# Patient Record
Sex: Female | Born: 1955 | Race: White | Hispanic: No | Marital: Married | Smoking: Former smoker
Health system: Southern US, Community
[De-identification: ages and names within clinical notes are randomized; demographics above are authoritative.]

## PROBLEM LIST (undated history)

## (undated) DIAGNOSIS — C439 Malignant melanoma of skin, unspecified: Secondary | ICD-10-CM

## (undated) DIAGNOSIS — N281 Cyst of kidney, acquired: Secondary | ICD-10-CM

## (undated) DIAGNOSIS — M869 Osteomyelitis, unspecified: Secondary | ICD-10-CM

## (undated) DIAGNOSIS — Z87440 Personal history of urinary (tract) infections: Secondary | ICD-10-CM

## (undated) DIAGNOSIS — M4800 Spinal stenosis, site unspecified: Secondary | ICD-10-CM

## (undated) DIAGNOSIS — E041 Nontoxic single thyroid nodule: Secondary | ICD-10-CM

## (undated) DIAGNOSIS — M8580 Other specified disorders of bone density and structure, unspecified site: Secondary | ICD-10-CM

## (undated) DIAGNOSIS — I10 Essential (primary) hypertension: Secondary | ICD-10-CM

## (undated) DIAGNOSIS — M1990 Unspecified osteoarthritis, unspecified site: Secondary | ICD-10-CM

## (undated) HISTORY — DX: Spinal stenosis, site unspecified: M48.00

## (undated) HISTORY — DX: Essential (primary) hypertension: I10

## (undated) HISTORY — DX: Malignant melanoma of skin, unspecified: C43.9

## (undated) HISTORY — DX: Other specified disorders of bone density and structure, unspecified site: M85.80

## (undated) HISTORY — DX: Cyst of kidney, acquired: N28.1

## (undated) HISTORY — DX: Osteomyelitis, unspecified: M86.9

## (undated) HISTORY — DX: Unspecified osteoarthritis, unspecified site: M19.90

## (undated) HISTORY — PX: SKIN CANCER EXCISION: SHX779

## (undated) HISTORY — DX: Nontoxic single thyroid nodule: E04.1

---
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
## 1999-11-25 DIAGNOSIS — M179 Osteoarthritis of knee, unspecified: Secondary | ICD-10-CM | POA: Insufficient documentation

## 2003-08-19 DIAGNOSIS — E039 Hypothyroidism, unspecified: Secondary | ICD-10-CM | POA: Insufficient documentation

## 2006-03-15 ENCOUNTER — Ambulatory Visit: Payer: Self-pay | Admitting: Internal Medicine

## 2006-03-18 ENCOUNTER — Ambulatory Visit: Payer: Self-pay | Admitting: Internal Medicine

## 2007-02-24 ENCOUNTER — Encounter: Admission: RE | Admit: 2007-02-24 | Discharge: 2007-02-24 | Payer: Self-pay | Admitting: Internal Medicine

## 2007-02-27 ENCOUNTER — Ambulatory Visit: Payer: Self-pay | Admitting: Oncology

## 2007-03-04 LAB — COMPREHENSIVE METABOLIC PANEL
ALT: 16 U/L (ref 0–35)
CO2: 30 mEq/L (ref 19–32)
Calcium: 9.7 mg/dL (ref 8.4–10.5)
Chloride: 107 mEq/L (ref 96–112)
Creatinine, Ser: 0.68 mg/dL (ref 0.40–1.20)
Glucose, Bld: 102 mg/dL — ABNORMAL HIGH (ref 70–99)
Sodium: 140 mEq/L (ref 135–145)
Total Bilirubin: 0.8 mg/dL (ref 0.3–1.2)
Total Protein: 6.5 g/dL (ref 6.0–8.3)

## 2007-03-04 LAB — CBC WITH DIFFERENTIAL (CANCER CENTER ONLY)
BASO#: 0 10*3/uL (ref 0.0–0.2)
BASO%: 0.6 % (ref 0.0–2.0)
EOS%: 1.7 % (ref 0.0–7.0)
HGB: 13.1 g/dL (ref 11.6–15.9)
MCH: 32.2 pg (ref 26.0–34.0)
MCHC: 34.7 g/dL (ref 32.0–36.0)
MONO%: 5.4 % (ref 0.0–13.0)
NEUT#: 3.3 10*3/uL (ref 1.5–6.5)
RDW: 11.5 % (ref 10.5–14.6)

## 2007-03-04 LAB — LACTATE DEHYDROGENASE: LDH: 129 U/L (ref 94–250)

## 2007-03-11 ENCOUNTER — Ambulatory Visit (HOSPITAL_COMMUNITY): Admission: RE | Admit: 2007-03-11 | Discharge: 2007-03-11 | Payer: Self-pay | Admitting: Oncology

## 2007-03-25 ENCOUNTER — Encounter: Admission: RE | Admit: 2007-03-25 | Discharge: 2007-03-25 | Payer: Self-pay | Admitting: Internal Medicine

## 2007-08-19 DIAGNOSIS — I10 Essential (primary) hypertension: Secondary | ICD-10-CM | POA: Insufficient documentation

## 2007-08-19 DIAGNOSIS — C439 Malignant melanoma of skin, unspecified: Secondary | ICD-10-CM | POA: Insufficient documentation

## 2007-09-03 ENCOUNTER — Ambulatory Visit: Payer: Self-pay | Admitting: Oncology

## 2007-09-04 LAB — CBC WITH DIFFERENTIAL (CANCER CENTER ONLY)
EOS%: 3.9 % (ref 0.0–7.0)
Eosinophils Absolute: 0.2 10*3/uL (ref 0.0–0.5)
MCH: 31.8 pg (ref 26.0–34.0)
MCHC: 34.4 g/dL (ref 32.0–36.0)
MONO%: 6.5 % (ref 0.0–13.0)
NEUT#: 2.1 10*3/uL (ref 1.5–6.5)
Platelets: 228 10*3/uL (ref 145–400)
RBC: 4.11 10*6/uL (ref 3.70–5.32)

## 2007-09-04 LAB — COMPREHENSIVE METABOLIC PANEL
ALT: 17 U/L (ref 0–35)
AST: 18 U/L (ref 0–37)
Alkaline Phosphatase: 62 U/L (ref 39–117)
Creatinine, Ser: 0.78 mg/dL (ref 0.40–1.20)
Sodium: 140 mEq/L (ref 135–145)
Total Bilirubin: 0.7 mg/dL (ref 0.3–1.2)
Total Protein: 6.7 g/dL (ref 6.0–8.3)

## 2007-12-06 IMAGING — PT NM PET TUM IMG WHOLE BODY
6 series · 25 of 25 positions shown · non-contrast
Comparison: None available.

CLINICAL DATA: Newly diagnosed melanoma resected from right arm.  Staging.  
 FDG PET-CT TUMOR IMAGING (WHOLE BODY):
 Fasting Blood Glucose:  118.
TECHNIQUE: 15.6 mCi F-18 FDG was injected intravenously via the left arm.  Full-ring PET imaging was performed from the skull vertex through the lower extremities 45 minutes after injection.  CT data was obtained and used for attenuation correction and anatomic localization only.  (This was not acquired as a diagnostic CT examination.)

[Series 1: pet ac · axial · 3.3mm · 4.69mm/px · z∈[-870,+0]mm · 5 of 267 slices shown]
[im 1/267]
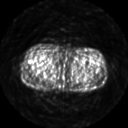
[im 67/267]
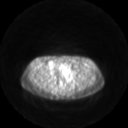
[im 134/267]
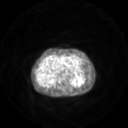
[im 200/267]
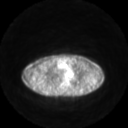
[im 267/267]
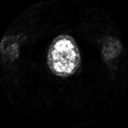

[Series 2: pet nac · axial · 3.3mm · 4.69mm/px · z∈[-870,+0]mm · 6 of 267 slices shown]
[im 1/267]
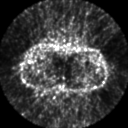
[im 54/267]
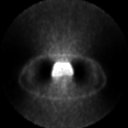
[im 107/267]
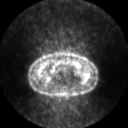
[im 160/267]
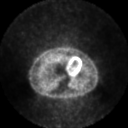
[im 213/267]
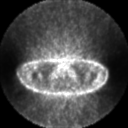
[im 267/267]
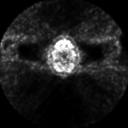

[Series 2: ct images · axial · 3.8mm · 0.98mm/px · z∈[-870,+0]mm · 6 of 267 slices shown]
[im 1/267  soft-tissue]
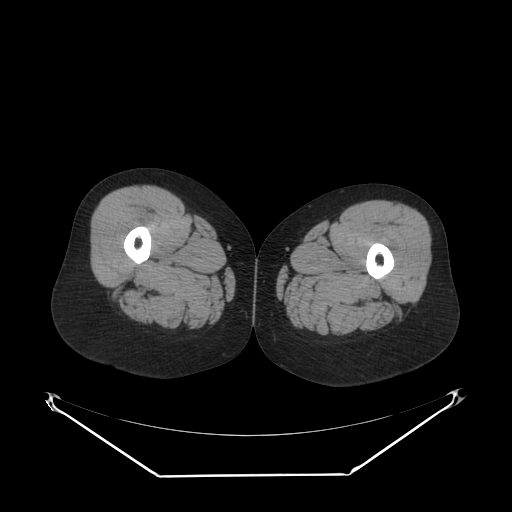
[im 54/267  soft-tissue]
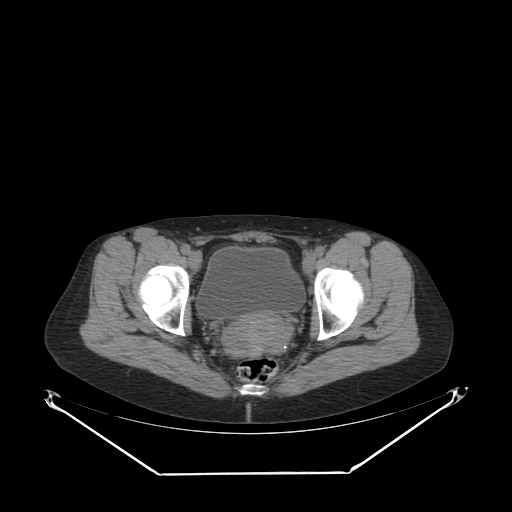
[im 107/267  soft-tissue]
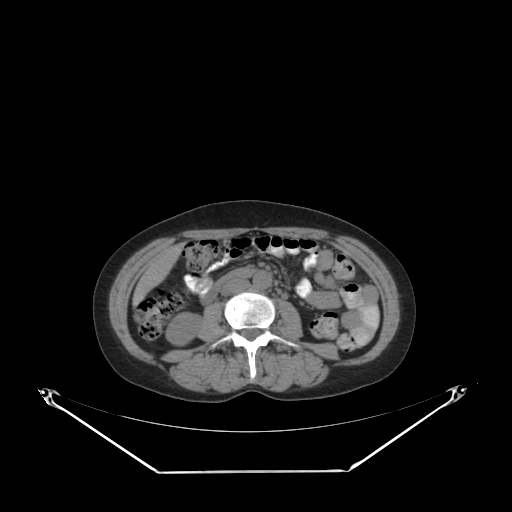
[im 160/267  soft-tissue]
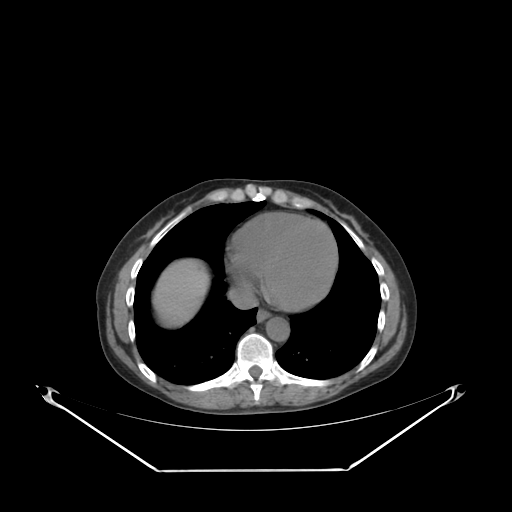
[im 213/267  soft-tissue]
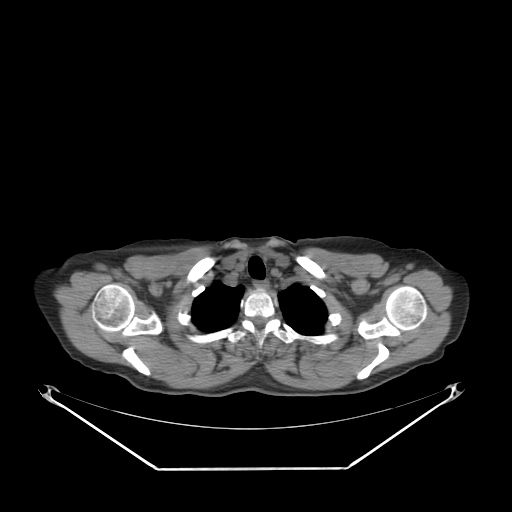
[im 267/267  soft-tissue]
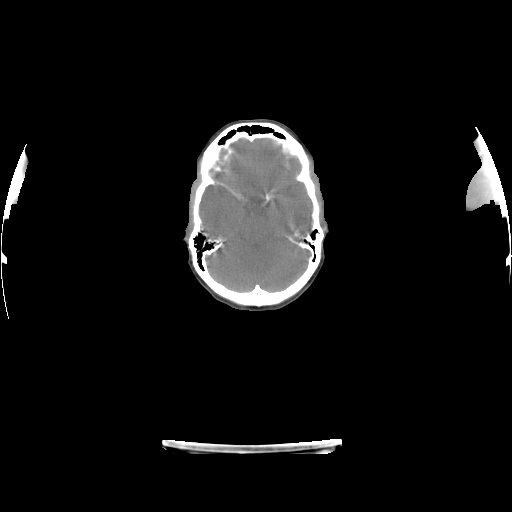

[Series 123: mip · coronal · 3.3mm · 4.69mm/px · 1 of 30 slices shown]
[im 1/30]
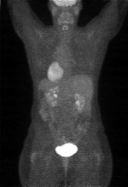

[Series 151: reformatted · axial · 3.3mm · 3.91mm/px · z∈[-870,+0]mm · 6 of 265 slices shown (1 of 2)]
[im 1/265]
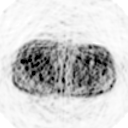
[im 53/265]
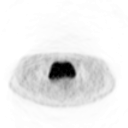
[im 106/265]
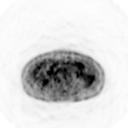
[im 159/265]
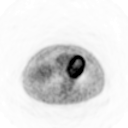
[im 212/265]
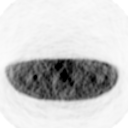
[im 265/265]
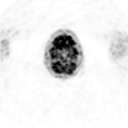

[Series 153: reformatted · coronal · 4.7mm · 6.98mm/px · 1 of 68 slices shown (2 of 2)]
[im 1/68]
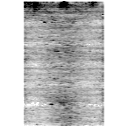

[25 of 25 positions shown; findings below may reference images not displayed]

FINDINGS: No abnormal sites of FDG uptake are identified within the neck, chest, abdomen or pelvis.  Additionally, there are no abnormal foci of radiopharmaceutical uptake seen in the lower extremities.
IMPRESSION: Negative PET CT scan.  No evidence of malignancy.

## 2008-08-16 ENCOUNTER — Encounter: Admission: RE | Admit: 2008-08-16 | Discharge: 2008-08-16 | Payer: Self-pay | Admitting: Internal Medicine

## 2008-08-31 ENCOUNTER — Ambulatory Visit: Payer: Self-pay | Admitting: Oncology

## 2008-09-16 LAB — CBC WITH DIFFERENTIAL (CANCER CENTER ONLY)
BASO#: 0 10*3/uL (ref 0.0–0.2)
EOS%: 2.7 % (ref 0.0–7.0)
HCT: 40.7 % (ref 34.8–46.6)
HGB: 14 g/dL (ref 11.6–15.9)
LYMPH#: 1.5 10*3/uL (ref 0.9–3.3)
LYMPH%: 24 % (ref 14.0–48.0)
MCH: 31.8 pg (ref 26.0–34.0)
MCHC: 34.4 g/dL (ref 32.0–36.0)
MCV: 92 fL (ref 81–101)
MONO%: 4.3 % (ref 0.0–13.0)
NEUT%: 68.4 % (ref 39.6–80.0)
RDW: 11 % (ref 10.5–14.6)

## 2008-09-16 LAB — COMPREHENSIVE METABOLIC PANEL
ALT: 14 U/L (ref 0–35)
Alkaline Phosphatase: 76 U/L (ref 39–117)
CO2: 26 mEq/L (ref 19–32)
Creatinine, Ser: 0.65 mg/dL (ref 0.40–1.20)
Sodium: 136 mEq/L (ref 135–145)
Total Bilirubin: 0.9 mg/dL (ref 0.3–1.2)
Total Protein: 7.4 g/dL (ref 6.0–8.3)

## 2008-09-16 LAB — LACTATE DEHYDROGENASE: LDH: 158 U/L (ref 94–250)

## 2009-02-24 ENCOUNTER — Ambulatory Visit: Payer: Self-pay | Admitting: Internal Medicine

## 2009-06-03 ENCOUNTER — Ambulatory Visit: Payer: Self-pay | Admitting: Internal Medicine

## 2009-07-19 ENCOUNTER — Ambulatory Visit: Payer: Self-pay | Admitting: Internal Medicine

## 2009-07-29 ENCOUNTER — Ambulatory Visit: Payer: Self-pay | Admitting: Internal Medicine

## 2010-01-26 ENCOUNTER — Ambulatory Visit: Payer: Self-pay | Admitting: Internal Medicine

## 2010-07-27 ENCOUNTER — Ambulatory Visit: Payer: Self-pay | Admitting: Internal Medicine

## 2010-08-02 ENCOUNTER — Encounter: Admission: RE | Admit: 2010-08-02 | Discharge: 2010-08-02 | Payer: Self-pay | Admitting: Internal Medicine

## 2010-08-18 DIAGNOSIS — Z780 Asymptomatic menopausal state: Secondary | ICD-10-CM | POA: Insufficient documentation

## 2010-11-03 ENCOUNTER — Ambulatory Visit (HOSPITAL_BASED_OUTPATIENT_CLINIC_OR_DEPARTMENT_OTHER): Admission: RE | Admit: 2010-11-03 | Discharge: 2010-11-03 | Payer: Self-pay | Admitting: Orthopedic Surgery

## 2011-02-16 ENCOUNTER — Inpatient Hospital Stay (HOSPITAL_COMMUNITY)
Admission: AD | Admit: 2011-02-16 | Discharge: 2011-02-22 | DRG: 373 | Disposition: A | Discharge status: Home / Self Care | Payer: 59 | Source: Ambulatory Visit | Attending: Internal Medicine | Admitting: Internal Medicine

## 2011-02-16 ENCOUNTER — Ambulatory Visit
Admission: RE | Admit: 2011-02-16 | Discharge: 2011-02-16 | Disposition: A | Discharge status: Home / Self Care | Payer: 59 | Source: Ambulatory Visit | Attending: Internal Medicine | Admitting: Internal Medicine

## 2011-02-16 ENCOUNTER — Ambulatory Visit: Payer: 59 | Admitting: Internal Medicine

## 2011-02-16 ENCOUNTER — Other Ambulatory Visit: Payer: Self-pay | Admitting: Internal Medicine

## 2011-02-16 DIAGNOSIS — E039 Hypothyroidism, unspecified: Secondary | ICD-10-CM | POA: Diagnosis present

## 2011-02-16 DIAGNOSIS — K3533 Acute appendicitis with perforation and localized peritonitis, with abscess: Principal | ICD-10-CM | POA: Diagnosis present

## 2011-02-16 DIAGNOSIS — K3580 Unspecified acute appendicitis: Secondary | ICD-10-CM

## 2011-02-16 DIAGNOSIS — I10 Essential (primary) hypertension: Secondary | ICD-10-CM | POA: Diagnosis present

## 2011-02-16 DIAGNOSIS — N289 Disorder of kidney and ureter, unspecified: Secondary | ICD-10-CM | POA: Diagnosis present

## 2011-02-16 DIAGNOSIS — Z85820 Personal history of malignant melanoma of skin: Secondary | ICD-10-CM

## 2011-02-16 LAB — PROTIME-INR: INR: 1 (ref 0.00–1.49)

## 2011-02-16 LAB — APTT: aPTT: 39 seconds — ABNORMAL HIGH (ref 24–37)

## 2011-02-16 MED ORDER — IOHEXOL 300 MG/ML  SOLN: 100.0000 mL | Freq: Once | INTRAMUSCULAR | Status: DC | PRN

## 2011-02-16 MED ORDER — IOHEXOL 300 MG/ML  SOLN
100.0000 mL | Freq: Once | INTRAMUSCULAR | Status: AC | PRN
  Administered 2011-02-16: 100 mL via INTRAVENOUS

## 2011-02-17 ENCOUNTER — Inpatient Hospital Stay (HOSPITAL_COMMUNITY): Payer: 59

## 2011-02-17 LAB — DIFFERENTIAL
Basophils Absolute: 0 10*3/uL (ref 0.0–0.1)
Basophils Relative: 1 % (ref 0–1)
Eosinophils Absolute: 0.1 10*3/uL (ref 0.0–0.7)
Monocytes Absolute: 0.4 10*3/uL (ref 0.1–1.0)
Neutrophils Relative %: 71 % (ref 43–77)

## 2011-02-17 LAB — CBC
Hemoglobin: 12.2 g/dL (ref 12.0–15.0)
MCV: 95.6 fL (ref 78.0–100.0)
RBC: 3.87 MIL/uL (ref 3.87–5.11)
WBC: 4.7 10*3/uL (ref 4.0–10.5)

## 2011-02-17 LAB — URINALYSIS, ROUTINE W REFLEX MICROSCOPIC
Bilirubin Urine: NEGATIVE
Ketones, ur: NEGATIVE mg/dL
Leukocytes, UA: NEGATIVE
Protein, ur: NEGATIVE mg/dL
Urine Glucose, Fasting: NEGATIVE mg/dL
Urobilinogen, UA: 1 mg/dL (ref 0.0–1.0)
pH: 6.5 (ref 5.0–8.0)

## 2011-02-17 LAB — URINE MICROSCOPIC-ADD ON

## 2011-02-17 LAB — BASIC METABOLIC PANEL
Calcium: 9.4 mg/dL (ref 8.4–10.5)
Creatinine, Ser: 0.61 mg/dL (ref 0.4–1.2)
GFR calc Af Amer: >60 mL/min (ref >60)
Potassium: 4.3 mEq/L (ref 3.5–5.1)
Sodium: 140 mEq/L (ref 135–145)

## 2011-02-18 NOTE — Consult Note (Signed)
Tara Stanley, ROLLO NO.:  [id number]  MEDICAL RECORD NO.:  [id number]           PATIENT TYPE:  I  LOCATION:  1529                         FACILITY:  [facility]  PHYSICIAN:  [name], M.D.DATE OF BIRTH:  04-19-1956  DATE OF CONSULTATION:  02/16/2011 DATE OF DISCHARGE:                                CONSULTATION   CHIEF COMPLAINT:  Ruptured appendicitis.  HISTORY OF PRESENT ILLNESS:  This is a 55 year old white female, previously well.  She noted the onset of diffuse crampy abdominal pain in the evening on Monday, February 12, 2011.  She has had some low-grade fever and some chills all week, but during the week she has been able to eat and has had normal bowel movements.  She says the pain is mostly in the lower abdomen now.  Today, she had 1 episode of vomiting and said she had 1 stool that was a little bit looser.  She states that last night  she had fever to 102 and was sweaty and had some chills.  She denies sore throat or headache or new-onset back pain.  She has not had any voiding symptoms or urologic problems.  She saw Dr. Eden Emms Baxley today.  Dr. Lenord Fellers obtained a CBC, which reportedly showed a white count of 9900 with a left shift.  A CT scan of the abdomen was performed today, which shows an inflamed appendix with appendicolith in it.  There are 2 tiny fluid collections in the region of the right lower quadrant, much too small to be drainable and it is also noted incidentally a 1.6 cm solid left renal mass that is somewhat concerning for possible renal cell carcinoma.  The patient has never had a colonoscopy and was admitted to the hospital by Dr. Sharlet Salina. We were asked to see the patient in consultation.  PAST MEDICAL HISTORY: 1. Hypertension. 2. Hypothyroidism. 3. Herniated L5 disk, not requiring surgery. 4. Right knee arthroscopy, November 2011. 5. Melanoma right arm resected, no known recurrence. 6. Osteomyelitis, left  ankle, age 37.  MEDICATIONS: 1. Synthroid 50 mcg per day. 2. Cozaar 100 mg a day. 3. Calcium carbonate.  DRUG ALLERGIES:  None known.  FAMILY HISTORY:  Mother living, has diabetes, obesity, hypertension. Father deceased, had a stroke and had COPD.  Two siblings, 1 brother and 1 sister, both have multiple orthopedic problems and are obese.  SOCIAL HISTORY:  The patient is married.  She has 2 children with her husband.  She is unemployed.  Her husband works for [employer].  She does not smoke.  She quit smoking at age 22.  She and her husband drink wine daily.  REVIEW OF SYSTEMS:  10-system review of systems is negative except as described above.  PHYSICAL EXAMINATION:  GENERAL:  A very healthy-appearing, thin middle- aged woman, in no acute distress.  She clearly is nontoxic, very conversant, does admit to having some lower abdominal pain. VITAL SIGNS:  Temperature 98.4, heart rate 79, respiratory rate 18, blood pressure 132/86. EYES:  Sclerae clear.  Extraocular movements intact. EARS, NOSE, MOUTH, and THROAT:  Nose, lips, tongue, and  oropharynx are without gross lesions. NECK:  Supple, nontender.  No mass.  No jugular venous distention. LUNGS:  Clear to auscultation.  No chest wall tenderness. HEART:  Regular rate and rhythm.  No tachycardia.  No ectopy.  Radial and femoral pulses are palpable. BREASTS:  Not examined. ABDOMEN:  Soft and nondistended.  Bowel sounds are present.  Liver and spleen are not enlarged.  She is tender in the right lower quadrant and to a lesser extent the left lower quadrant, but there is no mass.  There is no sign of any diffuse peritonitis.  There is some guarding in the right lower quadrant. GENITOURINARY:  There is no inguinal hernia or mass that I can detect. EXTREMITIES:  She moves all 4  extremities well without pain or deformity. NEUROLOGIC:  No gross motor or sensory deficits.  DATA ORDER REVIEWED:  I reviewed Dr. Eden Emms Baxley's  notes.  I have reviewed the CT scan personally.  ASSESSMENT: 1. Ruptured appendicitis with walled off microabscesses.  There is no     evidence of any peritonitis, obstruction, and there is no evidence     that any of these abscesses are large enough to drain. 2. Left renal mass. 3. Hypertension. 4. Hypothyroidism. 5. Remote history of melanoma in the right arm.  PLAN: 1. Since we are now 5 days into the course of for appendicitis and it apperars to be      walled off, I recommend that the initial treatment be     medical management with a broad-spectrum intravenous antibiotics,     analgesics, and bowel rest. 2. If she responds to this course of action, then will do a followup     CT scan in the few days and hopefully she can go home on     antibiotics and consider interval appendectomy in about 8 weeks. 3. If she fails to respond in 36-48 hours, we may need to consider     surgical intervention during this admission, although that will be     a higher risk for injury to adjacent structures and higher risk for     conversion to open laparotomy. 4. She will need a urology consult at some point regarding her left     renal mass.  I have discussed this with the patient and her husband and they are comfortable with this plan.  I will follow along with you on a daily basis.     [name], M.D.     HMI/MEDQ  D:  02/16/2011  T:  02/17/2011  Job:  [id number]  cc:   Luanna Cole. Lenord Fellers, M.D. Fax: [phone]  Electronically Signed by Claud Kelp M.D. on 02/18/2011 07:24:21 PM

## 2011-02-19 ENCOUNTER — Inpatient Hospital Stay (HOSPITAL_COMMUNITY): Payer: 59

## 2011-02-19 LAB — CBC
Hemoglobin: 12.5 g/dL (ref 12.0–15.0)
MCH: 31.2 pg (ref 26.0–34.0)
MCV: 94.3 fL (ref 78.0–100.0)
RBC: 4.01 MIL/uL (ref 3.87–5.11)

## 2011-02-19 LAB — DIFFERENTIAL
Basophils Absolute: 0.1 10*3/uL (ref 0.0–0.1)
Basophils Relative: 1 % (ref 0–1)
Eosinophils Absolute: 0.2 10*3/uL (ref 0.0–0.7)
Lymphocytes Relative: 24 % (ref 12–46)
Monocytes Absolute: 0.7 10*3/uL (ref 0.1–1.0)
Monocytes Relative: 9 % (ref 3–12)
WBC Morphology: INCREASED

## 2011-02-19 MED ORDER — IOHEXOL 300 MG/ML  SOLN: 100.0000 mL | Freq: Once | INTRAMUSCULAR | Status: AC | PRN

## 2011-02-20 ENCOUNTER — Inpatient Hospital Stay (HOSPITAL_COMMUNITY): Payer: 59

## 2011-02-20 MED ORDER — IOHEXOL 300 MG/ML  SOLN
50.0000 mL | Freq: Once | INTRAMUSCULAR | Status: AC | PRN
  Administered 2011-02-20: 50 mL via INTRAVENOUS

## 2011-02-22 LAB — DIFFERENTIAL
Basophils Absolute: 0.1 10*3/uL (ref 0.0–0.1)
Eosinophils Absolute: 0.3 10*3/uL (ref 0.0–0.7)
Lymphocytes Relative: 49 % — ABNORMAL HIGH (ref 12–46)
Neutro Abs: 2.1 10*3/uL (ref 1.7–7.7)
Neutrophils Relative %: 38 % — ABNORMAL LOW (ref 43–77)

## 2011-02-22 LAB — CBC
Hemoglobin: 12.1 g/dL (ref 12.0–15.0)
MCHC: 32.6 g/dL (ref 30.0–36.0)
MCV: 93.9 fL (ref 78.0–100.0)
WBC: 5.6 10*3/uL (ref 4.0–10.5)

## 2011-02-24 NOTE — Consult Note (Signed)
NAMEWAVERLEY, Stanley               ACCOUNT NO.:  [id number]  MEDICAL RECORD NO.:  [id number]           PATIENT TYPE:  I  LOCATION:  1529                         FACILITY:  [facility]  PHYSICIAN:  Heloise Purpura, MD      DATE OF BIRTH:  15-Aug-1956  DATE OF CONSULTATION: DATE OF DISCHARGE:                                CONSULTATION   ADDENDUM:  PHYSICAL EXAMINATION:  VITALS:  Temperature 98.4, pulse 84, respirations 18, blood pressure 112/75. CONSTITUTIONAL:  The patient is a well-nourished, well-developed, age- appropriate female in no acute distress. HEENT:  Normocephalic, atraumatic. NECK:  Supple without lymphadenopathy. CARDIOVASCULAR:  Regular rate and rhythm without obvious murmurs. LUNGS:  Clear bilaterally. ABDOMEN:  Soft and nondistended without abdominal masses.  She does have severe tenderness on palpation of her right lower quadrant as expected with her appendicitis. BACK:  No CVA tenderness. EXTREMITIES:  No edema. NEUROLOGIC:  Grossly intact.  LABORATORY DATA:  Serum creatinine 0.61.  Urinalysis, 0 to 2 red blood cells and 0 to 2 white blood cells.  Hemoglobin 12.5, white blood count 7.8, platelets 252.  RADIOLOGIC IMAGING:  Independently reviewed her CT scan and chest x-ray. Her chest x-ray did not demonstrate any evidence for pulmonary metastases.  Her CT scan reveals a 1.6-cm lesion of the anterior aspect of the left kidney in the perihilar region.  This lesion is completely endophytic and is hyperdense measuring approximately 70 to 80 Hounsfield units on her contrasted study.  On delayed images, the Hounsfield units does decrease to approximately 30 to 35 Hounsfield units indicating a probable enhancing lesion. There is the possibility of a small amount of fat within the mass raising the possiblity of an angiomyolipoma.  There is no evidence of abnormalities in the contralateral kidney that are concerning.  Also, there is no evidence for regional lymphadenopathy  or other evidence of metastatic disease. In addition, the lesion is not very well circumscribed, at least raising the possibility of an inflammatory condition, although a renal malignancy vs. AML would still be the most likely diagnoses.  IMPRESSION:  Left renal neoplasm concerning for possible renal cell carcinoma.  PLAN:  Ms. Tara Stanley will plan to follow up in approximately 2 months for repeat imaging of her kidney with an MRI of the kidneys with and without IV contrast.  If she has a persistent lesion, which is confirmed truly be enhancing and without obvious fat within the lesion, she will likely need surgical exploration and Nephron- sparing surgery at that time.  I have reviewed the natural history of renal cell carcinoma with her today and she is reassured that this is unlikely to necessitate any urgent treatment.  She understands that the recommendation would be to proceed with any appropriate treatment for her appendicitis as recommended by General Surgery with subsequent further evaluation and treatment of her left kidney lesion as indicated at that time.     Heloise Purpura, MD     LB/MEDQ  D:  02/19/2011  T:  02/19/2011  Job:  [id number]  cc:   Luanna Cole. Lenord Fellers, M.D. Fax: [phone]  [name], M.D. [address]  [address] [place] [address]  Electronically Signed by Heloise Purpura MD on 02/24/2011 01:19:56 PM

## 2011-03-01 HISTORY — PX: APPENDECTOMY: SHX54

## 2011-03-02 NOTE — Discharge Summary (Signed)
Tara Stanley, Tara Stanley NO.:  [id number]  MEDICAL RECORD NO.:  [id number]           PATIENT TYPE:  I  LOCATION:  1529                         FACILITY:  [facility]  PHYSICIAN:  Luanna Cole. Lenord Fellers, M.D.   DATE OF BIRTH:  [birth year]  DATE OF ADMISSION:  02/16/2011 DATE OF DISCHARGE:  02/22/2011                              DISCHARGE SUMMARY   FINAL DIAGNOSES: 1. Acute appendicitis with abscess formation. 2. Hypertension. 3. Hypothyroidism.  ADDITIONAL DIAGNOSIS:  A 1.6-cm solid mass in the left kidney.  CONSULTANTS:  Dr. Claud Kelp, General Surgery.  Additional consultant was Dr. Heloise Purpura in urology.  PROCEDURES: 1. CT scan of the abdomen and pelvis. 2. Attempted placement of percutaneous drain via CT guidance and     interventional radiology.  DISCHARGE MEDICATIONS: 1. Cipro 500 mg p.o. t.i.d. for 14 days. 2. Flagyl 500 mg p.o. b.i.d. for 14 days. 3. Cozaar 100 mg daily. 4. Synthroid 0.05 mg daily. 5. Calcium and vitamin D.  Calcium 1200 mg daily and vitamin D 2000     units daily, both of these are p.o.  DISPOSITION:  The patient is being discharged home with plans to follow up with Dr. Claud Kelp in 3 weeks after discharge, sooner if worse. Also tentatively planned is an appendectomy in 8 weeks post discharge.  BRIEF HISTORY:  A pleasant 55 year old white female who came to the office on February 16, 2011, complaining of abdominal cramping beginning on the evening of February 12, 2011.  Pain persisted for several days. She had fever the evening before admission of 102.6 degrees.  She complained of headache and had one episode of vomiting.  She said pain was across her lower abdomen but she was found to have definitive right lower quadrant tenderness with rebound tenderness.  CT scan of the abdomen and pelvis showed an acute appendicitis with abscess formation. She was also found to have an incidental finding of 1.6 cm mass in left kidney along  with hepatic cyst.  She was admitted for IV fluid hydration, pain control, and surgical consultation.  HOSPITAL COURSE:  The patient was admitted to [facility] for IV fluids and IV antibiotics.  She was seen in consultation by General Surgery and started on Invanz 1 g daily.  The patient improved gradually and over a couple of days but on Monday, February 19, 2011, she developed worsening abdominal pain that was rather abrupt and acute. She was sent for CT scan of the abdomen and pelvis that showed the size of the abscess to be slightly larger than previous exam.  Also there was concern for possible early small-bowel obstruction.  Interventional Radiology was consulted and asked to attempt to put in a drain via CT guidance.  However, due to the position of the abscess, this procedure was not able to be completed.  The patient was continued on IV antibiotics and was made n.p.o. overnight.  She subsequently improved without further intervention other than the IV antibiotics.  She tolerated low-residue diet well.  She had no vomiting in the hospital. She required no pain medications in the hospital.  By  February 22, 2011, she was felt to be stable for discharge.  She was afebrile at that time. IV antibiotics were discontinued.  She will be maintained on Cipro 500 mg p.o. b.i.d. for 14 days and Flagyl 500 mg p.o. b.i.d. for 14 days with anticipation of an elective appendectomy some 8 weeks post discharge.  She is to see Dr. Claud Kelp 3 weeks post discharge. Should she have recurrence of symptoms or any concerns for any reason, she is to get in touch with either me or Dr. Derrell Lolling.  Also the patient will follow up as an outpatient with Dr. Heloise Purpura and it is anticipated she will have an MRI of the kidney under his direction in a few weeks.     Luanna Cole. Lenord Fellers, M.D.     MJB/MEDQ  D:  02/22/2011  T:  02/22/2011  Job:  [id number]  cc:   [name], M.D. [address] [place] [address]  Heloise Purpura, MD Fax: [phone]  Electronically Signed by Marlan Palau M.D. on 03/02/2011 08:25:49 PM

## 2011-03-02 NOTE — H&P (Signed)
Tara Stanley, Tara Stanley NO.:  [id number]  MEDICAL RECORD NO.:  [id number]           PATIENT TYPE:  I  LOCATION:  1529                         FACILITY:  [facility]  PHYSICIAN:  Luanna Cole. Lenord Fellers, M.D.   DATE OF BIRTH:  1956/10/24  DATE OF ADMISSION:  02/16/2011 DATE OF DISCHARGE:                             HISTORY & PHYSICAL   CHIEF COMPLAINT:  Abdominal cramping.  HISTORY OF PRESENT ILLNESS:  This 55 year old white female with a history of hypertension and hypothyroidism, had onset of abdominal cramping on the evening of February 12, 2011.  This abdominal pain has persisted for several days now.  She had a rough night the evening prior to admission with shaking chills and 1 episode of vomiting.  Temperature last evening was 102.6 degrees.  She had slight diarrhea this morning. She did not sleep well.  Today, is complaining of headache.  She says abdominal pain is across her lower abdomen, but on exam has definite rebound tenderness in the right lower quadrant.  I sent her for a CT scan of the abdomen and pelvis show an acute appendicitis with abscess. There is also a new incidental finding of 1.6 cm mass (solid) left kidney.  She also has liver cyst.  She was admitted for IV fluid, hydration, pain control, and surgical consultation/intervention.  PAST MEDICAL HISTORY: 1. History of mild spinal stenosis and lumbar disk disease, L4-L5. 2. History of hypertension, started on medication in 2098. 3. 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 4. History of osteomyelitis of the left tibia in 1969. 5. Melanoma on the right arm in 2007 (stage I Clark level II Breslow,     depth 0.2 mm). 6. Dysplastic nevus, left shoulder, in 2008.  MEDICATIONS: 1. Synthroid 0.05 mg p.o. daily. 2. Cozaar 100 mg p.o. daily. 3. Vitamin D calcium supplement.  ALLERGIES/INTOLERANCES:  The patient does not tolerate HYDROCODONE well. She  says it causes bad dreams and questionable hallucinations.  PAST SURGICAL HISTORY:  She had right knee medial meniscal tear and underwent surgery on November 03, 2010, by Dr. Lequita Halt with debridement of the meniscus, mammogram done August 2011, she has declined influenza vaccine.  She had Tdap immunization in July 2010.  SOCIAL HISTORY:  She is married, presently nonsmoker, smoked up to 2-1/2 packs per day, quit in 1996, began smoking at age 60.  Two glasses of wine at night.  She has an [place] degree from [employer]. Two adult daughters.  She is married.  Husband is CEO of [employer].  FAMILY HISTORY:  Father died of COPD with a history of CVA.  Mother, history of being overweight with hypertension and diabetes mellitus. Sister is overweight, but otherwise healthy.  Brother with a history of sleep apnea.  REVIEW OF SYSTEMS:  Has maintained appetite past several days.  No URI symptoms.  No persistent vomiting or diarrhea.  No sore throat, does complain of headache today.  PHYSICAL EXAMINATION:  VITAL SIGNS:  In the office, temperature 98.8 degrees orally, pulse 80 and regular, blood pressure 106/62. SKIN:  Warm and dry. NODES:  None. HEENT:  Pharynx and TMs are not inflamed. NECK:  No JVD.  No thyromegaly.  No bruits.  No adenopathy. CHEST:  Clear to auscultation. CARDIAC:  Regular rate and rhythm.  Normal S1, S2 without murmurs. ABDOMEN:  Bowel sounds decreased.  Abdomen is not distended.  No organomegaly.  The patient is tender in her lower abdomen just below her umbilicus and to the right.  She is very tender in the right lower quadrant with rebound tenderness consistently.  The patient verbalizes that it hurts worse when urine leaks.  LABORATORY DATA:  CBC, white blood cell count 9900 with left shift.  CT report acute appendicitis with abscess, liver cysts, 1.6 cm solid left kidney mass.  IMPRESSION: 1. Acute appendicitis with abscess. 2. 1.6 cm solid  left renal mass. 3. Hypertension. 4. Hypothyroidism. 5. History of lumbar disk disease and mild spinal stenosis. 6. History of melanoma, right arm. 7. History of right knee medial meniscal debridement.  PLAN:  The patient is admitted for IV fluids, pain management, and surgical consultation.     Luanna Cole. Lenord Fellers, M.D.     MJB/MEDQ  D:  02/17/2011  T:  02/18/2011  Job:  [id number]  cc:   [name], M.D. [address] [place] [address]  Electronically Signed by Marlan Palau M.D. on 03/02/2011 08:25:55 PM

## 2011-03-12 ENCOUNTER — Other Ambulatory Visit: Payer: Self-pay | Admitting: Internal Medicine

## 2011-03-12 ENCOUNTER — Ambulatory Visit (INDEPENDENT_AMBULATORY_CARE_PROVIDER_SITE_OTHER): Payer: 59 | Admitting: Internal Medicine

## 2011-03-12 ENCOUNTER — Ambulatory Visit
Admission: RE | Admit: 2011-03-12 | Discharge: 2011-03-12 | Disposition: A | Discharge status: Home / Self Care | Payer: 59 | Source: Ambulatory Visit | Attending: Internal Medicine | Admitting: Internal Medicine

## 2011-03-12 DIAGNOSIS — K3532 Acute appendicitis with perforation and localized peritonitis, without abscess: Secondary | ICD-10-CM

## 2011-03-12 DIAGNOSIS — R1084 Generalized abdominal pain: Secondary | ICD-10-CM

## 2011-03-12 MED ORDER — IOHEXOL 300 MG/ML  SOLN
100.0000 mL | Freq: Once | INTRAMUSCULAR | Status: AC | PRN
  Administered 2011-03-12: 100 mL via INTRAVENOUS

## 2011-03-13 LAB — POCT I-STAT 4, (NA,K, GLUC, HGB,HCT)
HCT: 41 % (ref 36.0–46.0)
Sodium: 141 mEq/L (ref 135–145)

## 2011-03-16 ENCOUNTER — Encounter: Payer: Self-pay | Admitting: Internal Medicine

## 2011-03-19 ENCOUNTER — Ambulatory Visit: Payer: 59 | Admitting: Physician Assistant

## 2011-03-20 NOTE — Miscellaneous (Signed)
Summary: Colon  Clinical Lists Changes Patient scheduled for direct colon per Dr. Juanda Chance. Previsit 03/28/11@2pm , colon 04/05/11@2 :30pm. Patient aware of appointment dates and times.

## 2011-03-24 ENCOUNTER — Emergency Department (HOSPITAL_COMMUNITY)
Admission: EM | Admit: 2011-03-24 | Discharge: 2011-03-24 | Disposition: A | Discharge status: Home / Self Care | Payer: 59 | Attending: Emergency Medicine | Admitting: Emergency Medicine

## 2011-03-24 DIAGNOSIS — K36 Other appendicitis: Secondary | ICD-10-CM | POA: Insufficient documentation

## 2011-03-24 DIAGNOSIS — R1031 Right lower quadrant pain: Secondary | ICD-10-CM | POA: Insufficient documentation

## 2011-03-24 LAB — DIFFERENTIAL
Eosinophils Absolute: 0.1 10*3/uL (ref 0.0–0.7)
Lymphocytes Relative: 30 % (ref 12–46)
Lymphs Abs: 2.3 10*3/uL (ref 0.7–4.0)
Monocytes Relative: 6 % (ref 3–12)
Neutro Abs: 4.6 10*3/uL (ref 1.7–7.7)
Neutrophils Relative %: 61 % (ref 43–77)

## 2011-03-24 LAB — URINALYSIS, ROUTINE W REFLEX MICROSCOPIC
Bilirubin Urine: NEGATIVE
Glucose, UA: NEGATIVE mg/dL
Specific Gravity, Urine: 1.023 (ref 1.005–1.030)
pH: 6 (ref 5.0–8.0)

## 2011-03-24 LAB — CBC
HCT: 37 % (ref 36.0–46.0)
Hemoglobin: 12.4 g/dL (ref 12.0–15.0)
MCH: 31.4 pg (ref 26.0–34.0)
MCV: 93.7 fL (ref 78.0–100.0)
RBC: 3.95 MIL/uL (ref 3.87–5.11)
WBC: 7.4 10*3/uL (ref 4.0–10.5)

## 2011-03-24 LAB — URINE MICROSCOPIC-ADD ON

## 2011-03-27 NOTE — Consult Note (Signed)
  [name], [name] NO.:  [id number]  MEDICAL RECORD NO.:  [id number]           PATIENT TYPE:  E  LOCATION:  WLED                         FACILITY:  [facility]  PHYSICIAN:  Sharlet Salina T. Bambi Fehnel, M.D.DATE OF BIRTH:  [birth year]  DATE OF CONSULTATION: DATE OF DISCHARGE:  03/24/2011                                CONSULTATION   CHIEF COMPLAINT:  Lower abdominal pain.  HISTORY:  Tara Stanley is a generally healthy 55 year old female hospitalized from February 18, 2011, through February 22, 2011, when she presented with about a week of abdominal pain and was found to have a localized perforated appendicitis with abscess.  She was treated with intravenous antibiotics and the abscess resolved without percutaneous drainage.  She is discharged home and completed a 2-week course of oral antibiotics.  This was completed 2 weeks ago.  She was without abdominal pain of any kind soon after discharge.  She has remained well and has been active.  She saw Dr. Derrell Lolling in the office.  A followup CT scan showed essential resolution of the inflammatory process.  Interval appendectomy has been planned for approximately 1 month.  She woke this morning with some crampy somewhat diffuse lower abdominal discomfort without severe pain.  She felt "tender" when she pressed in the right lower quadrant.  She has not had any fever, chills, nausea, vomiting or urinary symptoms.  These symptoms are similar to when her appendicitis started last time.  I asked her to come into the emergency room for evaluation.  PAST MEDICAL HISTORY:  Generally healthy with history of mild hypertension, hypothyroidism, history of melanoma.  MEDICATIONS:  Synthroid, Cozaar, vitamin D.  ALLERGIES:  No known drug allergies.  PERTINENT PHYSICAL EXAM:  VITAL SIGNS:  She is afebrile.  Vital signs all within normal limits. GENERAL:  Generally she appears well, in no distress. ABDOMEN:  Pertinent findings are  limited the abdomen.  Bowel sounds are active.  No distention.  There is mild right lower quadrant tenderness without guarding or peritoneal signs.  LABORATORY DATA:  Urinalysis unremarkable.  White count is 7.4 without left shift.  ASSESSMENT AND PLAN:  Recurrent lower abdominal pain in a patient being treated nonoperatively for perforated appendicitis with plan to have appendectomy.  She does not appear ill, has normal white count, has minimal pain and tenderness.  I believe it would be safe to restart her on oral antibiotics as precaution with close followup in the office. She is to contact Dr. Derrell Lolling in about 48 hours let him know how she is doing and we will follow up with him in the office.  She understands to call back for any worsening pain.     Lorne Skeens. Kenyon Eichelberger, M.D.     Tory Emerald  D:  03/24/2011  T:  03/25/2011  Job:  [id number]  cc:   [name], M.D. [address] [place] [address]  Luanna Cole. Lenord Fellers, M.D. Fax: [phone]  Electronically Signed by Glenna Fellows M.D. on 03/27/2011 11:37:30 AM

## 2011-03-28 NOTE — Consult Note (Signed)
  Tara Stanley, PINCUS NO.:  [id number]  MEDICAL RECORD NO.:  [id number]           PATIENT TYPE:  I  LOCATION:  1529                         FACILITY:  [facility]  PHYSICIAN:  Heloise Purpura, MD      DATE OF BIRTH:  01-05-1956  DATE OF CONSULTATION:  02/19/2011 DATE OF DISCHARGE:                                CONSULTATION   REASON FOR CONSULTATION:  Left renal neoplasm.  HISTORY:  Tara Stanley is a very pleasant 55 year old female, seen in consultation at the request of Dr. Eden Emms Baxley for an incidentally- detected left renal mass.  She was admitted to the hospital for acute appendicitis and is currently being evaluated by General Surgery. 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  This did appear to de-enhance on delayed imaging, although no noncontrasted CT scan was performed.  She does have a history of a herniated disk and so has had some flank pain in the past, although nothing that would specifically indicate pain from a renal origin.  She also denies any history of hematuria and has no family history of renal cell carcinoma.  She otherwise denies any history of renal dysfunction, acute-on-chronic renal failure, or history of urolithiasis.  She has undergone a chest x-ray, which was negative for any findings that would be concerning for metastatic disease.  PAST MEDICAL HISTORY: 1. Hypertension. 2. Hypothyroidism with history of Hashimoto thyroiditis. 3. History of spinal stenosis and lumbar disk disease. 4. History of myelitis of the left tibia. 5. History of melanoma, status post excision.  PAST SURGICAL HISTORY: 1. Repair of right meniscal tear. 2. Removal of melanoma.  MEDICATIONS: 1. Synthroid. 2. Cozaar. 3. Vitamin D.  ALLERGIES:  She does have an intolerance to HYDROCODONE, although denies any specific  true allergies.  FAMILY HISTORY:  Her father died of COPD and had a history of a CVA. Her mother had a history of hypertension and diabetes.  There is no history of GU malignancy.  SOCIAL HISTORY:  She is currently married.  Her husband is the CEO of [employer].  She did smoke multiple packs of cigarettes per day and quit in 1996.  She had smoked for over 20 years at that point.  She drinks approximately 2 glasses of wine each night.  REVIEW OF SYSTEMS:  A complete review of systems was performed and reviewed.  She has had recent fever and chills as well as abdominal pain associated with her appendicitis.  She specifically denies any hematuria.  PHYSICAL EXAMINATION:  VITAL SIGNS:  Temperature 98.4, pulse 84, respirations 18, blood pressure 112/75. CONSTITUTIONAL:  The patient is a well-nourished, well-developed female, in no acute distress.   Dictation ended at this point.     Heloise Purpura, MD     LB/MEDQ  D:  02/19/2011  T:  02/19/2011  Job:  [id number]  Electronically Signed by Heloise Purpura MD on 03/28/2011 10:19:04 PM

## 2011-03-29 ENCOUNTER — Ambulatory Visit (HOSPITAL_COMMUNITY)
Admission: RE | Admit: 2011-03-29 | Discharge: 2011-03-30 | Disposition: A | Discharge status: Home / Self Care | Payer: 59 | Source: Ambulatory Visit | Attending: General Surgery | Admitting: General Surgery

## 2011-03-29 ENCOUNTER — Other Ambulatory Visit: Payer: Self-pay | Admitting: General Surgery

## 2011-03-29 DIAGNOSIS — Z01812 Encounter for preprocedural laboratory examination: Secondary | ICD-10-CM | POA: Insufficient documentation

## 2011-03-29 DIAGNOSIS — K3580 Unspecified acute appendicitis: Secondary | ICD-10-CM | POA: Insufficient documentation

## 2011-03-29 DIAGNOSIS — I10 Essential (primary) hypertension: Secondary | ICD-10-CM | POA: Insufficient documentation

## 2011-03-29 DIAGNOSIS — Z79899 Other long term (current) drug therapy: Secondary | ICD-10-CM | POA: Insufficient documentation

## 2011-03-29 LAB — COMPREHENSIVE METABOLIC PANEL
Alkaline Phosphatase: 67 U/L (ref 39–117)
BUN: 8 mg/dL (ref 6–23)
CO2: 28 mEq/L (ref 19–32)
Chloride: 107 mEq/L (ref 96–112)
Creatinine, Ser: 0.6 mg/dL (ref 0.4–1.2)
GFR calc non Af Amer: >60 mL/min (ref >60)
Glucose, Bld: 104 mg/dL — ABNORMAL HIGH (ref 70–99)
Potassium: 4.7 mEq/L (ref 3.5–5.1)
Total Bilirubin: 0.3 mg/dL (ref 0.3–1.2)

## 2011-03-29 LAB — SURGICAL PCR SCREEN

## 2011-03-31 NOTE — Op Note (Signed)
NAME:  Tara Stanley, Tara Stanley NO.:  [id number]  MEDICAL RECORD NO.:  [id number]           PATIENT TYPE:  O  LOCATION:  1531                         FACILITY:  [facility]  PHYSICIAN:  [name], M.D.DATE OF BIRTH:  [birth year]  DATE OF PROCEDURE:  03/29/2011 DATE OF DISCHARGE:                              OPERATIVE REPORT   PREOPERATIVE DIAGNOSIS:  Chronic appendicitis.  POSTOPERATIVE DIAGNOSIS:  Chronic appendicitis.  OPERATION PERFORMED:  Laparoscopic appendectomy.  SURGEON:  [name], M.D.  OPERATIVE INDICATIONS:  This is a healthy 55 year old Caucasian female who was hospitalized on February 16, 2011, with abdominal pain that began 5 days prior to admission.  CT scan was performed, which showed an inflamed appendix with an appendicolith in it and 2 small fluid collections in the right lower quadrant too small to be drained.  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  It was thought that her appendicitis had been going on for about 5 days and so we chose to treat her medically at that point in time.  She did respond and after several days, was sent home on antibiotics and her symptoms completely resolved.  She had been followed up in the office and was doing well with plans to do an interval appendectomy about 8 weeks following her initial episode.  This past weekend, she developed some severe lower abdominal pain and was evaluated and found not to be acutely ill, but was started back on Flagyl and Cipro by Dr. Johna Sheriff.  I saw her in the office yesterday and the pain was better, but she said she had had a low-grade fever at home. Physical exam was not that remarkable.  Because she was seemed to be having some recurrent symptoms, I felt that it was best to proceed immediately to the appendectomy.  OPERATIVE FINDINGS:  The appendix was swollen and severely  chronically inflamed with lots of fairly dense adhesions to the cecum and the retroperitoneum.  We were fortunately able to tease these apart bluntly without any injury to the cecum or small bowel. We did encounter 1 small area on the appendix that seemed to be perforated at the time of dissection and a little bit of liquid stool was encountered and removed.  We did not see any large holes in the appendix.  We could see the tip of the appendix adherent to the cecum and the ultimately identified the mesoappendix and the base of the appendix at the cecum and we were able to get a nice stapling device across that.  We could identify the terminal ileum and ileocecal valve and ascending colon.  We could identify the sigmoid colon and the liver and the rest of the small bowel and everything else looked pretty normal.  OPERATIVE TECHNIQUE:  Following the induction of general endotracheal anesthesia, intravenous antibiotics were given.  A Foley catheter was inserted.  The abdomen was prepped and draped in a sterile fashion. Surgical time-out was held identifying correct patient and correct procedure.  0.25% Marcaine with epinephrine  was used as local infiltration anesthetic.  A curved transverse incision was made at the superior rim of the umbilicus.  The fascia was incised vertically and the abdominal cavity entered under direct vision.  An 11 mm trocar was inserted and secured with a pursestring suture of 0 Vicryl. Pneumoperitoneum was created.  Video camera was inserted.  We put a 12 mm trocar in the left suprapubic area and a 5 mm trocar in the left midabdomen below the level of the umbilicus.  We spent about 30-40 minutes slowly dissecting the appendix away from the surrounding tissues.  We had to do this in slowly cautious steps because of the dense adhesions, but the mobilization went well.  We took down the appendiceal mesentery in small steps with the Harmonic scalpel and that went  well.  Ultimately, we had the entire appendix mobilized to where we could clearly see the junction of the appendix with the cecum. I took an Database administrator with a 3.5 mm staple load and inserted it through the left suprapubic trocar.  This was inserted and placed transversely across the base of the appendix and in fact I took a little rim of the cecum just to make sure I had a good healthy tissue.  The stapler was closed, held in place for 30 seconds, fired and removed. Staple line looked very good.  The appendix was placed in the specimen bag and removed.  We then spent a good deal of time irrigating out the right lower quadrant, right paracolic gutter, pelvis, and removing fluid from the subphrenic space.  We did this repeatedly and the irrigation fluid looked completely clear.  There did not appear to be any purulent material or exudate or fecal material anywhere.  We inspected the staple line and it actually looked very good.  Because of the chronic inflammation, I chose to put Tisseel tissue sealant on the staple line and let that harden.  After this was done, we were satisfied.  All the fluid was removed.  The trocars were removed.  There was no bleeding from the trocar sites.  The pneumoperitoneum was released.  The fascia at the umbilicus and the fascia in the left suprapubic area were closed with 0 Vicryl sutures.  After irrigating out subcutaneous tissue, we closed the skin with subcuticular sutures of 4-0 Monocryl and Steri- Strips.  Clean bandages were placed and the patient was taken to recovery room in stable condition.  ESTIMATED BLOOD LOSS:  About 20 mL.  COMPLICATIONS:  None.  Sponge, needle, and instrument counts were correct.  ADDENDUM:  During the case, the appendix was sent to the lab and Dr. Frederica Kuster took a look at it and said it was very consistent with an appendix.     [name], M.D.     HMI/MEDQ  D:  03/29/2011  T:  03/30/2011  Job:   [id number]  Electronically Signed by Claud Kelp M.D. on 03/31/2011 05:26:00 PM

## 2011-04-05 ENCOUNTER — Other Ambulatory Visit: Payer: 59 | Admitting: Internal Medicine

## 2011-04-09 ENCOUNTER — Ambulatory Visit (INDEPENDENT_AMBULATORY_CARE_PROVIDER_SITE_OTHER): Payer: 59 | Admitting: Internal Medicine

## 2011-04-09 DIAGNOSIS — N760 Acute vaginitis: Secondary | ICD-10-CM

## 2011-04-13 ENCOUNTER — Other Ambulatory Visit: Payer: 59 | Admitting: Internal Medicine

## 2011-07-09 ENCOUNTER — Other Ambulatory Visit: Payer: Self-pay | Admitting: Internal Medicine

## 2011-08-19 DIAGNOSIS — K7689 Other specified diseases of liver: Secondary | ICD-10-CM | POA: Insufficient documentation

## 2011-11-25 DIAGNOSIS — D1771 Benign lipomatous neoplasm of kidney: Secondary | ICD-10-CM | POA: Insufficient documentation

## 2011-12-14 ENCOUNTER — Other Ambulatory Visit: Payer: Self-pay | Admitting: Internal Medicine

## 2012-02-05 ENCOUNTER — Ambulatory Visit (INDEPENDENT_AMBULATORY_CARE_PROVIDER_SITE_OTHER): Payer: 59 | Admitting: Internal Medicine

## 2012-02-05 ENCOUNTER — Encounter: Payer: Self-pay | Admitting: Internal Medicine

## 2012-02-05 VITALS — BP 128/90 | HR 76 | Temp 98.8°F | Wt 145.5 lb

## 2012-02-05 DIAGNOSIS — N2889 Other specified disorders of kidney and ureter: Secondary | ICD-10-CM

## 2012-02-05 DIAGNOSIS — I10 Essential (primary) hypertension: Secondary | ICD-10-CM

## 2012-02-05 DIAGNOSIS — R1032 Left lower quadrant pain: Secondary | ICD-10-CM

## 2012-02-05 DIAGNOSIS — E039 Hypothyroidism, unspecified: Secondary | ICD-10-CM

## 2012-02-05 DIAGNOSIS — M48061 Spinal stenosis, lumbar region without neurogenic claudication: Secondary | ICD-10-CM

## 2012-02-05 DIAGNOSIS — Z1211 Encounter for screening for malignant neoplasm of colon: Secondary | ICD-10-CM

## 2012-02-05 DIAGNOSIS — N289 Disorder of kidney and ureter, unspecified: Secondary | ICD-10-CM

## 2012-02-05 LAB — CBC WITH DIFFERENTIAL/PLATELET
Lymphocytes Relative: 38 % (ref 12–46)
Lymphs Abs: 2.3 10*3/uL (ref 0.7–4.0)
Neutro Abs: 3.1 10*3/uL (ref 1.7–7.7)
Neutrophils Relative %: 52 % (ref 43–77)
Platelets: 273 10*3/uL (ref 150–400)
RBC: 4.28 MIL/uL (ref 3.87–5.11)
WBC: 6 10*3/uL (ref 4.0–10.5)

## 2012-02-05 LAB — COMPREHENSIVE METABOLIC PANEL
ALT: 14 U/L (ref 0–35)
CO2: 27 mEq/L (ref 19–32)
Calcium: 10 mg/dL (ref 8.4–10.5)
Chloride: 105 mEq/L (ref 96–112)
Sodium: 139 mEq/L (ref 135–145)
Total Bilirubin: 0.5 mg/dL (ref 0.3–1.2)
Total Protein: 6.9 g/dL (ref 6.0–8.3)

## 2012-02-05 NOTE — Patient Instructions (Signed)
Patient scheduled for ct abdomen/pelvis with contrast on Wed. 02/06/2012 at 11:45 am [facility]. She will stop by this pm to pick up contrast.

## 2012-02-06 ENCOUNTER — Ambulatory Visit
Admission: RE | Admit: 2012-02-06 | Discharge: 2012-02-06 | Disposition: A | Discharge status: Home / Self Care | Payer: 59 | Source: Ambulatory Visit | Attending: Internal Medicine | Admitting: Internal Medicine

## 2012-02-06 DIAGNOSIS — R1032 Left lower quadrant pain: Secondary | ICD-10-CM

## 2012-02-06 MED ORDER — IOHEXOL 300 MG/ML  SOLN
100.0000 mL | Freq: Once | INTRAMUSCULAR | Status: AC | PRN
  Administered 2012-02-06: 100 mL via INTRAVENOUS

## 2012-02-08 ENCOUNTER — Telehealth: Payer: Self-pay

## 2012-02-08 ENCOUNTER — Encounter: Payer: Self-pay | Admitting: Internal Medicine

## 2012-02-08 NOTE — Telephone Encounter (Signed)
Patient scheduled for an appointment with Dr. Juanda Chance for a colonoscopy on 03/19/2012. Also appointment with Dr. Rondel Baton on 02/14/2012 at 9:15 am.patient notified.

## 2012-02-17 ENCOUNTER — Encounter: Payer: Self-pay | Admitting: Internal Medicine

## 2012-02-17 DIAGNOSIS — M48061 Spinal stenosis, lumbar region without neurogenic claudication: Secondary | ICD-10-CM | POA: Insufficient documentation

## 2012-02-17 DIAGNOSIS — E042 Nontoxic multinodular goiter: Secondary | ICD-10-CM | POA: Insufficient documentation

## 2012-02-17 NOTE — Progress Notes (Signed)
Subjective:    Patient ID: Tara Stanley, female    DOB: 10/30/1956, 56 y.o.   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 Patient has been doing vigorous exercise. Doesn't recall injuring herself with exercise or any other time in the last 4 weeks.  In February 2012 patient presented to the office with right lower quadrant abdominal pain and CT revealed an acute appendicitis with abscess. She also was found to have a 1.6 cm mass in her left kidney. Patient was admitted to [facility] 02/16/2011 for IV antibiotics. She was treated nonoperatively for perforated appendicitis with abscess. Subsequently had laparoscopic appendectomy on 03/29/2011. Although she was evaluated by Dr. Laverle Patter while in the hospital at [facility] she subsequently chose to go [facility] for consultation regarding the left kidney mass. She has not been to the office since April 2012. I did not know what had transpired after March 2012 at [facility]. She tells me that they've been watching the left kidney mass on a regular basis. They think it is probably benign.  Patient has history of right arm melanoma 2007, osteomyelitis of left tibia 1968, laparoscopic knee surgery for right knee medial meniscal tear 2011.  Patient is married with 2 adult children. She graduated from [employer] and worked as an Programmer, multimedia. Husband is CEO of [employer].  Melanoma was superficial spreading, Breslin depth was 0.2 mm, Clark level II, clinical stage I. Sees dermatologist on a regular basis. She did seek oncologist for consultation. Patient followed by [facility] endocrinology for hypothyroidism.  Patient has history of white coat hypertension. Says blood pressures at  home are very good.  Hypothyroidism diagnosed 2003. History of 1.5 cm left mid thyroid nodule. This is been ultrasounded and thought to be benign.  Patient had tetanus immunization July 2010. Had bone density study August 2011. That is the last mammogram I have on file is well August 2011. History of mild osteopenia.  History of L4-L5 degenerative disc changes with central protrusion of disc material based on MRI lumbar spine in [place] October 2000. Chest x-ray negative in 2008.   Patient has not had screening colonoscopy. This was discussed today and she is willing to do this.  Patient smoked for some 20 years and stopped at age 61. Social alcohol consumption consisting of red wine 16 ounces daily. 5 days a week patient exercises doing elliptical running and works with the trainer  Father died at age 71 of emphysema with history of stroke mother with history of hypertension and borderline diabetes. One brother with sleep apnea, one sister with obesity.  Patient serves on board of the [employer]. Likes to work with at risk kids in high school. Enjoys gardening.    Review of Systems no nausea or vomiting. No fever or chills. Bowel movements are normal.     Objective:   Physical Exam abdomen is soft nondistended. Bowel sounds are active. No hepatosplenomegaly or masses. Patient is tender in the left lower quadrant without rebound tenderness.        Assessment & Plan:  Left lower quadrant abdominal pain present for proximally 4 weeks. Possibility includes diverticulitis. Patient says she's not had a pelvic exam in some time. Has not had colonoscopy.  History of left  kidney mass which previously was asymptomatic and is followed closely at [facility]. Presently thought to be benign.  Hypertension  Hypothyroidism  History of lumbar spinal stenosis  History of perforated appendix with abscess 2012  Plan: Patient will undergo CT of the abdomen  and pelvis. Pending on results of that study, make further recommendations such is GYN exam and colonoscopy.  See labs.

## 2012-02-19 ENCOUNTER — Other Ambulatory Visit: Payer: Self-pay | Admitting: Internal Medicine

## 2012-03-05 ENCOUNTER — Ambulatory Visit (AMBULATORY_SURGERY_CENTER): Payer: 59 | Admitting: *Deleted

## 2012-03-05 VITALS — Ht 66.5 in | Wt 145.0 lb

## 2012-03-05 DIAGNOSIS — Z1211 Encounter for screening for malignant neoplasm of colon: Secondary | ICD-10-CM

## 2012-03-05 MED ORDER — PEG-KCL-NACL-NASULF-NA ASC-C 100 G PO SOLR: ORAL | Status: DC

## 2012-03-05 NOTE — Progress Notes (Signed)
No allergy to eggs or soy products 

## 2012-03-17 ENCOUNTER — Telehealth: Payer: Self-pay | Admitting: Internal Medicine

## 2012-03-17 NOTE — Telephone Encounter (Signed)
Had questions regarding dietary foods she was to have eliminated from her diet.  Advised her to drink plenty of fluids tonight and tomorrow.  Wyona Almas

## 2012-03-19 ENCOUNTER — Ambulatory Visit (AMBULATORY_SURGERY_CENTER): Payer: 59 | Admitting: Internal Medicine

## 2012-03-19 ENCOUNTER — Encounter: Payer: Self-pay | Admitting: Internal Medicine

## 2012-03-19 VITALS — BP 139/78 | HR 62 | Temp 98.1°F | Resp 23 | Ht 66.0 in | Wt 145.0 lb

## 2012-03-19 DIAGNOSIS — Z1211 Encounter for screening for malignant neoplasm of colon: Secondary | ICD-10-CM

## 2012-03-19 MED ORDER — SODIUM CHLORIDE 0.9 % IV SOLN: 500.0000 mL | INTRAVENOUS | Status: DC

## 2012-03-19 NOTE — Op Note (Signed)
[facility] [address] [place], [place]  [address]  COLONOSCOPY PROCEDURE REPORT  PATIENT:  Tara, Stanley  MR#:  [id number] BIRTHDATE:  03/27/1956, 55 yrs. old  GENDER:  female ENDOSCOPIST:  Hedwig Morton. Juanda Chance, MD REF. BY:  Sharlet Salina, M.D. PROCEDURE DATE:  03/19/2012 PROCEDURE:  Colonoscopy 19147 ASA CLASS:  Class II INDICATIONS:  colorectal cancer screening, average risk MEDICATIONS:   MAC sedation, administered by CRNA, propofol (Diprivan) 300 mg  DESCRIPTION OF PROCEDURE:   After the risks and benefits and of the procedure were explained, informed consent was obtained. Digital rectal exam was performed and revealed no rectal masses. The LB [id number]-[id number] endoscope was introduced through the anus and advanced to the cecum, which was identified by both the appendix and ileocecal valve.  The quality of the prep was good, using MoviPrep.  The instrument was then slowly withdrawn as the colon was fully examined. <<PROCEDUREIMAGES>>  FINDINGS:  No polyps or cancers were seen (see image1, image2, image3, image4, and image5).   Retroflexed views in the rectum revealed no abnormalities.    The scope was then withdrawn from the patient and the procedure completed.  COMPLICATIONS:  None ENDOSCOPIC IMPRESSION: 1) No polyps or cancers 2) Normal colonoscopy RECOMMENDATIONS: 1) High fiber diet.  REPEAT EXAM:  In 10 year(s) for.  ______________________________ Hedwig Morton. Juanda Chance, MD  CC:  n. eSIGNED:   Hedwig Morton. Onnie Hatchel at 03/19/2012 09:43 AM  [place], [phone]

## 2012-03-19 NOTE — Patient Instructions (Signed)
Resume your prior medications today.  Please call if any questions or concerns.  High fiber diet given to the care partner per Dr. Juanda Chance.      YOU HAD AN ENDOSCOPIC PROCEDURE TODAY AT THE [facility]: Refer to the procedure report that was given to you for any specific questions about what was found during the examination.  If the procedure report does not answer your questions, please call your gastroenterologist to clarify.  If you requested that your care partner not be given the details of your procedure findings, then the procedure report has been included in a sealed envelope for you to review at your convenience later.  YOU SHOULD EXPECT: Some feelings of bloating in the abdomen. Passage of more gas than usual.  Walking can help get rid of the air that was put into your GI tract during the procedure and reduce the bloating. If you had a lower endoscopy (such as a colonoscopy or flexible sigmoidoscopy) you may notice spotting of blood in your stool or on the toilet paper. If you underwent a bowel prep for your procedure, then you may not have a normal bowel movement for a few days.  DIET: Your first meal following the procedure should be a light meal and then it is ok to progress to your normal diet.  A half-sandwich or bowl of soup is an example of a good first meal.  Heavy or fried foods are harder to digest and may make you feel nauseous or bloated.  Likewise meals heavy in dairy and vegetables can cause extra gas to form and this can also increase the bloating.  Drink plenty of fluids but you should avoid alcoholic beverages for 24 hours.  ACTIVITY: Your care partner should take you home directly after the procedure.  You should plan to take it easy, moving slowly for the rest of the day.  You can resume normal activity the day after the procedure however you should NOT DRIVE or use heavy machinery for 24 hours (because of the sedation medicines used during the test).    ollowing  upper endoscopy (EGD)  Vomiting of blood or coffee ground material  New chest pain or pain under the shoulder blades  Painful or persistently difficult swallowing  New shortness of breath  Fever of 100F or higher  Black, tarry-looking stools   SYMPTOMS TO REPORT IMMEDIATELY: A gastroenterologist can be reached at any hour.  During normal business hours, 8:30 AM to 5:00 PM Monday through Friday, call [phone].  After hours and on weekends, please call the GI answering service at [phone] who will take a message and have the physician on call contact you.   Following lower endoscopy (colonoscopy or flexible sigmoidoscopy):  Excessive amounts of blood in the stool  Significant tenderness or worsening of abdominal pains  Swelling of the abdomen that is new, acute  Fever of 100F or higher FFOLLOW UP: If any biopsies were taken you will be contacted by phone or by letter within the next 1-3 weeks.  Call your gastroenterologist if you have not heard about the biopsies in 3 weeks.  Our staff will call the home number listed on your records the next business day following your procedure to check on you and address any questions or concerns that you may have at that time regarding the information given to you following your procedure. This is a courtesy call and so if there is no answer at the home number and we have  not heard from you through the emergency physician on call, we will assume that you have returned to your regular daily activities without incident.  SIGNATURES/CONFIDENTIALITY: You and/or your care partner have signed paperwork which will be entered into your electronic medical record.  These signatures attest to the fact that that the information above on your After Visit Summary has been reviewed and is understood.  Full responsibility of the confidentiality of this discharge information lies with you and/or your care-partner.

## 2012-03-19 NOTE — Progress Notes (Signed)
No complaints noted in the recovery room. Maw  Patient did not experience any of the following events: a burn prior to discharge; a fall within the facility; wrong site/side/patient/procedure/implant event; or a hospital transfer or hospital admission upon discharge from the facility. (G8907) Patient did not have preoperative order for IV antibiotic SSI prophylaxis. (G8918)  

## 2012-03-20 ENCOUNTER — Telehealth: Payer: Self-pay | Admitting: *Deleted

## 2012-03-20 NOTE — Telephone Encounter (Signed)
  Follow up Call-  Call back number 03/19/2012  Post procedure Call Back phone  # [phone]  Permission to leave phone message Yes     Patient questions:  Do you have a fever, pain , or abdominal swelling? no Pain Score  0 *  Have you tolerated food without any problems? yes  Have you been able to return to your normal activities? yes  Do you have any questions about your discharge instructions: Diet   no Medications  no Follow up visit  no  Do you have questions or concerns about your Care? no  Actions: * If pain score is 4 or above: No action needed, pain <4.

## 2012-08-17 ENCOUNTER — Other Ambulatory Visit: Payer: Self-pay | Admitting: Internal Medicine

## 2012-08-17 NOTE — Telephone Encounter (Signed)
Needs 6 month recheck. Approve for 30 days.

## 2012-08-18 ENCOUNTER — Other Ambulatory Visit: Payer: Self-pay

## 2012-08-18 MED ORDER — LOSARTAN POTASSIUM 100 MG PO TABS: 100.0000 mg | ORAL_TABLET | Freq: Every day | ORAL | Status: DC

## 2012-09-04 ENCOUNTER — Ambulatory Visit: Payer: 59 | Admitting: Internal Medicine

## 2012-09-12 ENCOUNTER — Ambulatory Visit (INDEPENDENT_AMBULATORY_CARE_PROVIDER_SITE_OTHER): Payer: 59 | Admitting: Internal Medicine

## 2012-09-12 ENCOUNTER — Encounter: Payer: Self-pay | Admitting: Internal Medicine

## 2012-09-12 VITALS — BP 142/84 | HR 68 | Temp 97.9°F | Wt 142.5 lb

## 2012-09-12 DIAGNOSIS — M48061 Spinal stenosis, lumbar region without neurogenic claudication: Secondary | ICD-10-CM

## 2012-09-12 DIAGNOSIS — N2889 Other specified disorders of kidney and ureter: Secondary | ICD-10-CM

## 2012-09-12 DIAGNOSIS — Z85820 Personal history of malignant melanoma of skin: Secondary | ICD-10-CM

## 2012-09-12 DIAGNOSIS — I10 Essential (primary) hypertension: Secondary | ICD-10-CM

## 2012-09-12 DIAGNOSIS — N289 Disorder of kidney and ureter, unspecified: Secondary | ICD-10-CM

## 2012-09-12 DIAGNOSIS — E039 Hypothyroidism, unspecified: Secondary | ICD-10-CM

## 2012-09-28 NOTE — Patient Instructions (Addendum)
Watch blood pressure at home and let me know if it stays consistently elevated. See dermatologist yearly. Continue followup of hypothyroidism with thecal endocrinology. Return in 6 months for physical exam.

## 2012-09-28 NOTE — Progress Notes (Signed)
  Subjective:    Patient ID: Tara Stanley, female    DOB: [birth year], 56 y.o.   MRN: [id number]  HPI 56 year old white female with history of hypertension treated with losartan 100 mg daily. Also has history of hypothyroidism treated with levothyroxine and followed by endocrinologist. She had perforated appendicitis with abscess in 2012. Had laparoscopic appendectomy March 2012. Being followed at [facility] by Dr. Luane School, urologist for 1.6 cm left renal mass which is thought to be benign. I asked her to come in because she has not been seen here in some time. We'll need to check and make her blood pressure was doing okay. Also wanted to see how other health issues were going. His been busy with the [employer] on raising. She is excited about that.  History of white coat hypertension. Checks blood pressures at home and says blood pressure is always good at home. Works out with a Psychologist, educational and cheese white under good control. History of spinal stenosis of the lumbar region. History of melanoma right arm 2007. History of left thyroid nodule. History of right knee medial meniscus tear November 2011 status post surgery by Dr. Despina Hick. History of left tibia osteomyelitis 1969. Had bone density study August 2011.  Has issues with insomnia while traveling. Think she may need some medication called in. Has been barring medication from her husband she will check at home.    Review of Systems     Objective:   Physical Exam Neck is supple without thyromegaly or carotid bruits. No nodule palpable. Chest clear to auscultation. Cardiac exam regular rate and rhythm normal S1 and S2. Skin is warm and dry. Extremities without edema.       Assessment & Plan:  Hypertension-stable on current regimen  History of melanoma right arm 2007-sees dermatologist on an annual basis  Hypothyroidism  1.6 cm left renal mass being followed at [facility] thought to be  benign  History of spinal stenosis and recurrent back pain  Plan: Patient advised to keep watch on blood pressure. Is elevated today at 142/84. She is compliant with her medication. Return in 6 months for physical exam.

## 2013-01-22 ENCOUNTER — Other Ambulatory Visit: Payer: Self-pay | Admitting: Internal Medicine

## 2013-03-31 ENCOUNTER — Telehealth: Payer: Self-pay | Admitting: Internal Medicine

## 2013-03-31 NOTE — Telephone Encounter (Signed)
Spoke with patient; gave appointment for 4/10 @ 3p.

## 2013-03-31 NOTE — Telephone Encounter (Signed)
We will be happy to see next week

## 2013-04-09 ENCOUNTER — Encounter: Payer: Self-pay | Admitting: Internal Medicine

## 2013-04-09 ENCOUNTER — Ambulatory Visit
Admission: RE | Admit: 2013-04-09 | Discharge: 2013-04-09 | Disposition: A | Discharge status: Home / Self Care | Payer: 59 | Source: Ambulatory Visit | Attending: Internal Medicine | Admitting: Internal Medicine

## 2013-04-09 ENCOUNTER — Ambulatory Visit (INDEPENDENT_AMBULATORY_CARE_PROVIDER_SITE_OTHER): Payer: 59 | Admitting: Internal Medicine

## 2013-04-09 VITALS — BP 146/80 | HR 72 | Temp 99.1°F | Wt 146.0 lb

## 2013-04-09 DIAGNOSIS — M79609 Pain in unspecified limb: Secondary | ICD-10-CM

## 2013-04-09 DIAGNOSIS — R209 Unspecified disturbances of skin sensation: Secondary | ICD-10-CM

## 2013-04-09 DIAGNOSIS — M79671 Pain in right foot: Secondary | ICD-10-CM

## 2013-04-09 DIAGNOSIS — R202 Paresthesia of skin: Secondary | ICD-10-CM

## 2013-04-10 ENCOUNTER — Telehealth: Payer: Self-pay

## 2013-04-10 DIAGNOSIS — M25572 Pain in left ankle and joints of left foot: Secondary | ICD-10-CM

## 2013-04-10 DIAGNOSIS — R200 Anesthesia of skin: Secondary | ICD-10-CM

## 2013-04-10 NOTE — Progress Notes (Signed)
Patient informed. Will schedule MRI of foot.

## 2013-04-10 NOTE — Telephone Encounter (Signed)
Patient scheduled for an MRI of l foot on 04/16/2013 at 2:45 at [facility] imaging [address]. Informed of this.

## 2013-04-16 ENCOUNTER — Telehealth: Payer: Self-pay | Admitting: Internal Medicine

## 2013-04-16 ENCOUNTER — Ambulatory Visit
Admission: RE | Admit: 2013-04-16 | Discharge: 2013-04-16 | Disposition: A | Discharge status: Home / Self Care | Payer: 59 | Source: Ambulatory Visit | Attending: Internal Medicine | Admitting: Internal Medicine

## 2013-04-16 NOTE — Telephone Encounter (Signed)
Phone call to patient with results of MRI of her foot. 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 No mass and no stress fracture. Spoke with patient this evening regarding results. Advised her to cut back on amount of tennis playing of the next 10 days or so. Recommended she see Dr. Roanna Epley for evaluation. She has been taking Advil and will continue to do that. Asked her to ice foot down for 20 minutes daily.

## 2013-05-02 NOTE — Progress Notes (Signed)
  Subjective:    Patient ID: Tara Stanley, female    DOB: [birth year], 57 y.o.   MRN: [id number]  HPI Complains of tingling and pain right foot along fourth metatarsal. Has been playing a lot of tennis. Denies any other possibility of injury. Does exercise a great deal of works out with a Psychologist, educational.  Said she has not been playing as much tenderness over the past week or so since she's been out-of-town, the pain has improved    Review of Systems     Objective:   Physical Exam tender along fourth metatarsal right foot. No erythema or swelling. Able to bear weight without difficulty.        Assessment & Plan:  Possible stress fracture or other injury right  foot  Plan: X-ray of right foot and if negative proceed with MRI for further evaluation  Addendum: MRI of the right foot is negative. Suggested she see Dr. Roanna Epley for further evaluation

## 2013-05-02 NOTE — Patient Instructions (Addendum)
Proceed with x-ray of right foot to rule out stress fracture.

## 2013-05-04 ENCOUNTER — Other Ambulatory Visit: Payer: Self-pay | Admitting: Urology

## 2013-05-04 DIAGNOSIS — N2889 Other specified disorders of kidney and ureter: Secondary | ICD-10-CM

## 2013-05-07 ENCOUNTER — Other Ambulatory Visit: Payer: 59

## 2013-05-08 ENCOUNTER — Ambulatory Visit
Admission: RE | Admit: 2013-05-08 | Discharge: 2013-05-08 | Disposition: A | Discharge status: Home / Self Care | Payer: 59 | Source: Ambulatory Visit | Attending: Urology | Admitting: Urology

## 2013-05-08 DIAGNOSIS — N2889 Other specified disorders of kidney and ureter: Secondary | ICD-10-CM

## 2013-05-08 MED ORDER — IOHEXOL 300 MG/ML  SOLN
100.0000 mL | Freq: Once | INTRAMUSCULAR | Status: AC | PRN
  Administered 2013-05-08: 100 mL via INTRAVENOUS

## 2013-06-15 ENCOUNTER — Telehealth: Payer: Self-pay | Admitting: Internal Medicine

## 2013-06-15 ENCOUNTER — Other Ambulatory Visit: Payer: 59 | Admitting: Internal Medicine

## 2013-06-15 NOTE — Telephone Encounter (Signed)
Noted patient is having Executive physical at [facility] in November

## 2013-06-23 ENCOUNTER — Encounter: Payer: 59 | Admitting: Internal Medicine

## 2013-07-28 ENCOUNTER — Encounter: Payer: Self-pay | Admitting: Obstetrics & Gynecology

## 2013-07-29 ENCOUNTER — Encounter: Payer: Self-pay | Admitting: Obstetrics & Gynecology

## 2013-07-29 ENCOUNTER — Ambulatory Visit (INDEPENDENT_AMBULATORY_CARE_PROVIDER_SITE_OTHER): Payer: 59 | Admitting: Obstetrics & Gynecology

## 2013-07-29 VITALS — BP 118/80 | HR 64 | Resp 16 | Ht 66.25 in | Wt 144.8 lb

## 2013-07-29 DIAGNOSIS — Z01419 Encounter for gynecological examination (general) (routine) without abnormal findings: Secondary | ICD-10-CM

## 2013-07-29 DIAGNOSIS — Z124 Encounter for screening for malignant neoplasm of cervix: Secondary | ICD-10-CM

## 2013-07-29 NOTE — Patient Instructions (Addendum)

## 2013-07-29 NOTE — Progress Notes (Addendum)
57 y.o. G2P2 MarriedCaucasianF here for annual exam.  Executive physical exam scheduled November at [facility].  Having follow up for renal cyst at [facility].  Has had 5 CT's in a little over a year.  Major complaint is hot flashes.  She has not been on HRT.     Patient's last menstrual period was 01/01/2008.          Sexually active: yes  The current method of family planning is post menopausal status.    Exercising: yes  cardio, weights, and tennis Smoker:  no  Health Maintenance: Pap:  02/14/12 WNL/negative HR HPV  History of abnormal Pap:  no MMG:  Per patient 2013 at imaging center on [address]-normal Colonoscopy:  2013 repeat in 10 years BMD:   8/11 mild osteopenia TDaP:  2010 Screening Labs: PCP, Hb today: PCP, Urine today: PCP   reports that she quit smoking about 19 years ago. Her smoking use included Cigarettes. She smoked 0.00 packs per day. She has never used smokeless tobacco. She reports that she drinks about 12.0 ounces of alcohol per week. She reports that she does not use illicit drugs.  Past Medical History  Diagnosis Date  . Hypertension   . Thyroid disease      nodule, hypothyroidism  . Spinal stenosis     mild  . Arthritis     osteoarthritis  . Cancer     melanoma- right upper arm  . Renal cyst     benign, found on CT scan, is being followed  . Osteomyelitis     left leg    Past Surgical History  Procedure Laterality Date  . Appendectomy  03/2011    laparoscopic  . Repair of rt med meniscal tear    . Skin cancer excision      right upper arm  . Left tibia      for osteomelitis of left tibia at age 89    Current Outpatient Prescriptions  Medication Sig Dispense Refill  . levothyroxine (SYNTHROID, LEVOTHROID) 50 MCG tablet Take 50 mcg by mouth daily.        Marland Kitchen losartan (COZAAR) 100 MG tablet TAKE 1 TABLET DAILY (NEED OFFICE VISIT)  90 tablet  0  . Misc Natural Products (GLUCOSAMINE CHONDROITIN ADV PO) Take by mouth.      . Multiple Vitamin  (MULTIVITAMIN) tablet Take 1 tablet by mouth daily.       No current facility-administered medications for this visit.    Family History  Problem Relation Age of Onset  . Colon cancer Neg Hx   . Esophageal cancer Neg Hx   . Stomach cancer Neg Hx   . Rectal cancer Neg Hx   . Diabetes Mother     borderline type II  . Hypertension Father   . Hypertension Mother   . Hypertension Other     all grandparents  . Stroke Maternal Grandmother   . Stroke Paternal Grandfather   . Stroke Father   . Aneurysm Maternal Aunt 63  . Osteoporosis Maternal Grandmother   . Arthritis Mother     ROS:  Pertinent items are noted in HPI.  Otherwise, a comprehensive ROS was negative.  Exam:   BP 118/80  Pulse 64  Resp 16  Ht 5' 6.25" (1.683 m)  Wt 144 lb 12.8 oz (65.681 kg)  BMI 23.19 kg/m2  LMP 01/01/2008  Weight change: -1lb   Height: 5' 6.25" (168.3 cm)  Ht Readings from Last 3 Encounters:  07/29/13 5' 6.25" (1.683  m)  03/19/12 5\' 6"  (1.676 m)  03/05/12 5' 6.5" (1.689 m)    General appearance: alert, cooperative and appears stated age Head: Normocephalic, without obvious abnormality, atraumatic Neck: no adenopathy, supple, symmetrical, trachea midline and thyroid normal to inspection and palpation Lungs: clear to auscultation bilaterally Breasts: normal appearance, no masses or tenderness Heart: regular rate and rhythm Abdomen: soft, non-tender; bowel sounds normal; no masses,  no organomegaly Extremities: extremities normal, atraumatic, no cyanosis or edema Skin: Skin color, texture, turgor normal. No rashes or lesions Lymph nodes: Cervical, supraclavicular, and axillary nodes normal. No abnormal inguinal nodes palpated Neurologic: Grossly normal   Pelvic: External genitalia:  no lesions              Urethra:  normal appearing urethra with no masses, tenderness or lesions              Bartholins and Skenes: normal                 Vagina: normal appearing vagina with normal color and  discharge, no lesions              Cervix: no lesions              Pap taken: no Bimanual Exam:  Uterus:  normal size, contour, position, consistency, mobility, non-tender, tenderness of pelvic floor on left side              Adnexa: normal adnexa and no mass, fullness, tenderness               Rectovaginal: Confirms               Anus:  normal sphincter tone, no lesions  A:  Well Woman with normal exam H/O pelvic floor pain, currently exacerbated with exercise  P:   Mammogram yearly.  Pt says was done this year at [facility] but not in system.  Release for mmg obtained.  I am not sure patient knows where it was done. [facility] Endo has checked multiple locations--no MMG since 2011.  Pt aware.) pap smear with neg HR HPV 2/13.  No pap today. Flexeril discussed for pelvic floor pain if she has trouble sleeping.  I think it will make her too sleepy during the day.  Declines for now. return annually or prn  An After Visit Summary was printed and given to the patient.

## 2013-08-06 ENCOUNTER — Other Ambulatory Visit: Payer: Self-pay | Admitting: Internal Medicine

## 2013-08-11 ENCOUNTER — Telehealth: Payer: Self-pay

## 2013-08-11 NOTE — Telephone Encounter (Signed)
Please let her know she has not had one done since 2011.  Dr. Beryle Quant office has the same results.  She needs MMG.

## 2013-08-11 NOTE — Telephone Encounter (Signed)
I have called [facility] and [facility] has a record of her MMG, but in 2011. I spoke with patient, she thought she had one last year, but didn't seem 100% sure. She told me Dr Lenord Fellers would possibly have this record and to try their office. i spoke with their office and they have the same record of 2011. Please advise on the next step.

## 2013-08-11 NOTE — Telephone Encounter (Signed)
lmtcb-? Where she had her last MMG, I have checked at [facility] and [facility]. Last one done at [facility] on 8/11.

## 2013-08-11 NOTE — Addendum Note (Signed)
Addended by: Jerene Bears on: 08/11/2013 11:32 AM   Modules accepted: Orders

## 2013-09-14 NOTE — Telephone Encounter (Signed)
[email or web address]/kn

## 2013-09-15 NOTE — Telephone Encounter (Signed)
Patient aware last MMG done in 2011. She will call [facility] to schedule this. If any problems will call us back.

## 2013-09-25 ENCOUNTER — Encounter: Payer: Self-pay | Admitting: Obstetrics & Gynecology

## 2013-09-25 ENCOUNTER — Ambulatory Visit (INDEPENDENT_AMBULATORY_CARE_PROVIDER_SITE_OTHER): Payer: 59 | Admitting: Obstetrics & Gynecology

## 2013-09-25 ENCOUNTER — Telehealth: Payer: Self-pay | Admitting: Obstetrics & Gynecology

## 2013-09-25 VITALS — BP 122/74 | HR 86 | Temp 99.1°F | Resp 20 | Wt 141.0 lb

## 2013-09-25 DIAGNOSIS — N390 Urinary tract infection, site not specified: Secondary | ICD-10-CM

## 2013-09-25 LAB — POCT URINALYSIS DIPSTICK
Ketones, UA: NEGATIVE
Nitrite, UA: NEGATIVE
Urobilinogen, UA: NEGATIVE
pH, UA: 5

## 2013-09-25 MED ORDER — CIPROFLOXACIN HCL 500 MG PO TABS: 500.0000 mg | ORAL_TABLET | Freq: Two times a day (BID) | ORAL | Status: DC

## 2013-09-25 NOTE — Telephone Encounter (Signed)
Patient is calling [facility]) patient isnt sure if she needs an appt or just call in some antibiotic. She is taken Cystex over the counter meds.

## 2013-09-25 NOTE — Progress Notes (Signed)
Subjective:     Patient ID: Tara Stanley, female   DOB: Sep 21, 1956, 57 y.o.   MRN: [id number]  HPI 57 yo with <1 day of increased frequency, urgency, and dysuria.  Feeling achy and possible low grade fevers.  No actually chills.  Feels like she has a UTI.  Taking OTC product with minimal success.  Hasn't had a UTI in several years and wonders what could be the cause.  When I inform her intercourse is common cause, she reports just returning from anniversary trip where she and spouse did have intercourse more frequently than normal.    Pertinent PMedHx:  Renal cysts followed by urology  Review of Systems  Genitourinary: Positive for dysuria, urgency and frequency. Negative for hematuria, flank pain, vaginal bleeding, vaginal discharge, genital sores and dyspareunia.  All other systems reviewed and are negative.       Objective:   Physical Exam  Constitutional: She is oriented to person, place, and time. She appears well-developed and well-nourished.  Neck: Normal range of motion. Neck supple.  Abdominal: Soft. Bowel sounds are normal. There is no tenderness (no suprapubic tenderness).  Musculoskeletal:  No flank pain with palpation  Neurological: She is alert and oriented to person, place, and time.  Skin: Skin is warm and dry.  Psychiatric: She has a normal mood and affect.       Assessment:     UTI     Plan:     Urine culture pending Ciprofloxin 500mg  BID x 7 days Post coital prevention of UTI discussed Continue OTC analgesic until dysuria resolved

## 2013-09-25 NOTE — Telephone Encounter (Signed)
Tara Stanley is a 57 y.o. female    Pain or burning with urination: burning/frequency and with urination started yesterday at 1700, taking cystex with some relief.   Okay with Dr. Hyacinth Meeker to book today at 12:30, patient agreeable and appointment booked.

## 2013-09-27 NOTE — Patient Instructions (Signed)
Please call if symptoms worsen or change

## 2013-09-28 ENCOUNTER — Telehealth: Payer: Self-pay

## 2013-09-28 LAB — URINE CULTURE

## 2013-09-28 NOTE — Telephone Encounter (Signed)
Patient notified of results. TOC scheduled.

## 2013-09-28 NOTE — Telephone Encounter (Signed)
Lmtcb//kn 

## 2013-09-28 NOTE — Telephone Encounter (Signed)
Message copied by Elisha Headland on Mon Sep 28, 2013  9:12 AM ------      Message from: Jerene Bears      Created: Mon Sep 28, 2013  8:56 AM       Inform culture + for E Coli.  Finish one week of abx.  Needs TOC 2 weeks.  Order entered. ------

## 2013-09-28 NOTE — Addendum Note (Signed)
Addended by: Jerene Bears on: 09/28/2013 08:57 AM   Modules accepted: Orders

## 2013-10-12 ENCOUNTER — Other Ambulatory Visit: Payer: Self-pay | Admitting: Internal Medicine

## 2013-10-16 ENCOUNTER — Other Ambulatory Visit: Payer: 59

## 2013-10-16 ENCOUNTER — Ambulatory Visit (INDEPENDENT_AMBULATORY_CARE_PROVIDER_SITE_OTHER): Payer: 59 | Admitting: *Deleted

## 2013-10-16 VITALS — BP 142/84 | HR 80 | Resp 16

## 2013-10-16 DIAGNOSIS — N390 Urinary tract infection, site not specified: Secondary | ICD-10-CM

## 2013-10-16 NOTE — Progress Notes (Signed)
Patient returning for follow up urine culture.  She states she completed antibiotics and her symptoms have resolved. Urine culture to lab and will call with results.

## 2013-10-18 LAB — URINE CULTURE: Colony Count: 60000

## 2013-11-05 ENCOUNTER — Other Ambulatory Visit: Payer: Self-pay

## 2013-11-17 ENCOUNTER — Telehealth: Payer: Self-pay | Admitting: Obstetrics & Gynecology

## 2013-11-17 ENCOUNTER — Other Ambulatory Visit: Payer: Self-pay

## 2013-11-17 DIAGNOSIS — Z1231 Encounter for screening mammogram for malignant neoplasm of breast: Secondary | ICD-10-CM

## 2013-11-17 DIAGNOSIS — M8580 Other specified disorders of bone density and structure, unspecified site: Secondary | ICD-10-CM | POA: Insufficient documentation

## 2013-11-17 NOTE — Telephone Encounter (Signed)
Patient is trying to find out of she needs to do a 3d mammogram or just a regular mammogram.

## 2013-11-17 NOTE — Telephone Encounter (Signed)
LMTCB  aa 

## 2013-11-23 NOTE — Telephone Encounter (Signed)
Spoke with pt who wondered about 3D MMG. Pt willing to pay the $50 if necessary. Advised that the new standard will soon be the 3D MMG, and some insurance companies are already paying the fee. Pt is not having any problems with her breasts and has not needed any extra films taken in the past, but she will pursue the 3D MMG.

## 2013-11-25 ENCOUNTER — Other Ambulatory Visit: Payer: Self-pay | Admitting: Endocrinology

## 2013-12-10 ENCOUNTER — Ambulatory Visit: Payer: 59

## 2014-01-11 ENCOUNTER — Ambulatory Visit
Admission: RE | Admit: 2014-01-11 | Discharge: 2014-01-11 | Disposition: A | Discharge status: Home / Self Care | Payer: 59 | Source: Ambulatory Visit

## 2014-01-11 DIAGNOSIS — Z1231 Encounter for screening mammogram for malignant neoplasm of breast: Secondary | ICD-10-CM

## 2014-01-18 ENCOUNTER — Telehealth: Payer: Self-pay | Admitting: Internal Medicine

## 2014-01-18 DIAGNOSIS — Z0289 Encounter for other administrative examinations: Secondary | ICD-10-CM

## 2014-01-18 DIAGNOSIS — N390 Urinary tract infection, site not specified: Secondary | ICD-10-CM

## 2014-01-18 NOTE — Telephone Encounter (Signed)
Patient in [place] on business trip with her husband. Has UTI symptoms acutely. Call in Cipro 500 mg twice daily for 7 days to [place]. Patient agrees to come in and have urine checked next week when she returns to town.

## 2014-01-26 ENCOUNTER — Encounter: Payer: Self-pay | Admitting: Internal Medicine

## 2014-01-26 ENCOUNTER — Ambulatory Visit (INDEPENDENT_AMBULATORY_CARE_PROVIDER_SITE_OTHER): Payer: 59 | Admitting: Internal Medicine

## 2014-01-26 VITALS — Temp 99.3°F

## 2014-01-26 DIAGNOSIS — N390 Urinary tract infection, site not specified: Secondary | ICD-10-CM

## 2014-01-26 DIAGNOSIS — I10 Essential (primary) hypertension: Secondary | ICD-10-CM

## 2014-01-26 LAB — POCT URINALYSIS DIPSTICK
Bilirubin, UA: NEGATIVE
GLUCOSE UA: NEGATIVE
Ketones, UA: NEGATIVE
Nitrite, UA: NEGATIVE
Protein, UA: NEGATIVE
SPEC GRAV UA: 1.02
Urobilinogen, UA: NEGATIVE
pH, UA: 5.5

## 2014-01-26 NOTE — Patient Instructions (Signed)
Patient request order for Zostavax vaccine. This was given. Urine culture and microscopic urinalysis pending. Basic metabolic panel pending. Would like to see you once yearly.

## 2014-01-26 NOTE — Progress Notes (Signed)
   Subjective:    Patient ID: Tara Stanley, female    DOB: 1956-10-08, 58 y.o.   MRN: [id number]  HPI She is here today to followup on urinary tract infection. She called on the evening of January 17 with UTI symptoms. Cipro 500 mg twice daily for 7 days was called in for her at a drugstore in [facility]. Patient says she subsequently developed Candida vaginitis but took over-the-counter treatment and got better. She also says that she had a comprehensive physical exam at [facility] health services. Dr. there was concerned about elevated diastolic pressure and add a diuretic to her antihypertensive medication. Says she's been taking diuretic has not had followup basic metabolic panel. This was drawn today. She also went back to [facility] to followup on renal cysts. She saw an oncologist there he said she could be followed by ultrasound rather than CT and felt she was stable.    Review of Systems     Objective:   Physical Exam urine dipstick shows  occult blood. Culture will be sent. Urine microscopic will be ordered.        Assessment & Plan:  Hypertension-now on diuretic in addition to antihypertensive medication  Urinary tract infection-culture ordered as well as urine microscopic. Patient now asymptomatic.  History of renal cyst-followed at [place]: Would like to see patient yearly to followup on these issues.

## 2014-01-27 LAB — URINALYSIS, ROUTINE W REFLEX MICROSCOPIC
Bilirubin Urine: NEGATIVE
Glucose, UA: NEGATIVE mg/dL
KETONES UR: NEGATIVE mg/dL
Leukocytes, UA: NEGATIVE
NITRITE: NEGATIVE
PH: 6 (ref 5.0–8.0)
Protein, ur: NEGATIVE mg/dL
SPECIFIC GRAVITY, URINE: 1.018 (ref 1.005–1.030)
Urobilinogen, UA: 0.2 mg/dL (ref 0.0–1.0)

## 2014-01-27 LAB — URINALYSIS, MICROSCOPIC ONLY
BACTERIA UA: NONE SEEN
Casts: NONE SEEN
Crystals: NONE SEEN
Squamous Epithelial / LPF: NONE SEEN

## 2014-01-27 LAB — BASIC METABOLIC PANEL
BUN: 17 mg/dL (ref 6–23)
CHLORIDE: 98 meq/L (ref 96–112)
CO2: 29 mEq/L (ref 19–32)
Calcium: 10.2 mg/dL (ref 8.4–10.5)
Creat: 0.64 mg/dL (ref 0.50–1.10)
Glucose, Bld: 107 mg/dL — ABNORMAL HIGH (ref 70–99)
POTASSIUM: 3.9 meq/L (ref 3.5–5.3)
SODIUM: 137 meq/L (ref 135–145)

## 2014-01-27 LAB — URINE CULTURE: Colony Count: 4000

## 2014-02-03 ENCOUNTER — Other Ambulatory Visit: Payer: Self-pay

## 2014-02-03 ENCOUNTER — Telehealth: Payer: Self-pay | Admitting: Internal Medicine

## 2014-02-03 DIAGNOSIS — N390 Urinary tract infection, site not specified: Secondary | ICD-10-CM

## 2014-02-03 MED ORDER — CIPROFLOXACIN HCL 500 MG PO TABS: 500.0000 mg | ORAL_TABLET | Freq: Two times a day (BID) | ORAL | Status: DC

## 2014-02-03 NOTE — Telephone Encounter (Signed)
Is in [place] and has developed another UTI. Last  One was in [place] a few weeks ago. Will call in Cipro 500 mg bid x 10 days. Needs f/u here upon return home.

## 2014-02-23 ENCOUNTER — Other Ambulatory Visit: Payer: Self-pay | Admitting: Endocrinology

## 2014-03-04 ENCOUNTER — Other Ambulatory Visit: Payer: Self-pay

## 2014-03-04 ENCOUNTER — Telehealth: Payer: Self-pay | Admitting: Internal Medicine

## 2014-03-04 DIAGNOSIS — N390 Urinary tract infection, site not specified: Secondary | ICD-10-CM

## 2014-03-04 MED ORDER — CIPROFLOXACIN HCL 500 MG PO TABS: 500.0000 mg | ORAL_TABLET | Freq: Two times a day (BID) | ORAL | Status: DC

## 2014-03-04 NOTE — Telephone Encounter (Signed)
Patient is out of town in [place] where there is noted. She has another urinary tract infection apparently. She symptomatic. Apparently cannot get to urgent care. Can get to a drug store. Were going to call in Cipro 500 mg twice daily for 7 days to pharmacy in [place]. She has made an appointment to be rechecked here on Monday, March 9. We will need to consider referring her to urologist. However, part of the issue is that we never got a urine culture when she was symptomatic because she is always out of town when these events occur.

## 2014-03-04 NOTE — Telephone Encounter (Signed)
Phone call from patient in [place] currently, stating she has yet another UTI. Has dysuria. Requesting antibiotics. OK per Dr. Renold Genta for Cipro 500mg  as directed. Appointment made for patient on Monday.

## 2014-03-08 ENCOUNTER — Ambulatory Visit (INDEPENDENT_AMBULATORY_CARE_PROVIDER_SITE_OTHER): Payer: 59 | Admitting: Internal Medicine

## 2014-03-08 ENCOUNTER — Encounter: Payer: Self-pay | Admitting: Internal Medicine

## 2014-03-08 VITALS — BP 132/84 | HR 68 | Temp 99.1°F | Wt 142.0 lb

## 2014-03-08 DIAGNOSIS — N390 Urinary tract infection, site not specified: Secondary | ICD-10-CM

## 2014-03-08 LAB — POCT URINALYSIS DIPSTICK
Bilirubin, UA: NEGATIVE
GLUCOSE UA: NEGATIVE
Ketones, UA: NEGATIVE
Nitrite, UA: NEGATIVE
PROTEIN UA: NEGATIVE
SPEC GRAV UA: 1.02
UROBILINOGEN UA: NEGATIVE
pH, UA: 6

## 2014-03-08 NOTE — Progress Notes (Signed)
   Subjective:    Patient ID: Tara Stanley, female    DOB: 05-Apr-1956, 58 y.o.   MRN: [id number]  HPI Issues with recurrent urinary infections occurring on trips out of town. Has been treated 3 times over the past few weeks for Urinary tract infections while out-of-town. We could not get urine specimen for culture due to her trips. Latest episode was March 5. We called in Cipro for her to Pharmacy in [place]. Previous episodes occurred January 19 and February 4. Each time she was treated with Cipro. Back  in September and October 2014 she was treated twice for UTIs by her GYN physician. On one occasion, she had greater than 100,000 Escherichia coli sensitive to Cipro. Does notice a relationship with sexual intercourse. Within a week of intercourse she notices UTI symptoms that have onset with frequency and dysuria. Has not had pyelonephritis. 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 She had an Programme researcher, broadcasting/film/video Physical at [employer] in November 2014 and urinalysis at that time showed heavy bacteria. No culture was done and she was not treated.  At the time of her executive physical, her lab work was within normal limits including TSH and lipid panel. She was started on diuretic for hypertension control. Stress test was normal. Bone density test was normal.    Review of Systems     Objective:   Physical Exam Urinalysis on Cipro today dipstick is normal. No CVA tenderness.       Assessment & Plan:  Recurrent urinary tract infections-improved symptomatically with Cipro  Plan: Asked patient to see urologist at [facility] for further evaluation. Finish course of Cipro previously prescribed last week. Culture was done today but urinalysis appears to be normal.  25 minutes spent with patient

## 2014-03-08 NOTE — Patient Instructions (Addendum)
Please make appointment with [facility]. Records will be sent to them.

## 2014-03-09 ENCOUNTER — Other Ambulatory Visit: Payer: Self-pay

## 2014-03-09 LAB — URINE CULTURE
COLONY COUNT: NO GROWTH
ORGANISM ID, BACTERIA: NO GROWTH

## 2014-03-09 MED ORDER — LOSARTAN POTASSIUM 100 MG PO TABS: 100.0000 mg | ORAL_TABLET | Freq: Every day | ORAL | Status: DC

## 2014-03-31 ENCOUNTER — Ambulatory Visit (INDEPENDENT_AMBULATORY_CARE_PROVIDER_SITE_OTHER): Payer: 59 | Admitting: Endocrinology

## 2014-03-31 ENCOUNTER — Encounter: Payer: Self-pay | Admitting: Endocrinology

## 2014-03-31 VITALS — BP 138/86 | HR 76 | Temp 97.8°F | Resp 14 | Ht 66.5 in | Wt 142.6 lb

## 2014-03-31 DIAGNOSIS — E039 Hypothyroidism, unspecified: Secondary | ICD-10-CM

## 2014-03-31 DIAGNOSIS — E042 Nontoxic multinodular goiter: Secondary | ICD-10-CM

## 2014-03-31 LAB — TSH: TSH: 0.15 u[IU]/mL — ABNORMAL LOW (ref 0.35–5.50)

## 2014-03-31 LAB — T4, FREE: Free T4: 0.66 ng/dL (ref 0.60–1.60)

## 2014-03-31 NOTE — Progress Notes (Signed)
Patient ID: Tara Stanley, female   DOB: 1956/06/01, 58 y.o.   MRN: [id number]   Reason for Appointment:  Goiter, followup visit    History of Present Illness:   The goiter was first found in the year 2003 incidentally on a routine exam At that time she was in [place] and her endocrinologist evaluated her with serial ultrasounds She had multiple nodules with the largest one being 1.6 cm on the left side She was started on thyroid suppression Synthroid 50 mcg daily She did not feel any different with starting the thyroid supplement and her records do not indicate that she had hypothyroidism  The patient feels fairly well now and has not noticed any swelling in her neck. No difficulty swallowing, no choking sensation.  Her TSH in 01/2013 was 0.67 and she was continued on Synthroid 50 mcg      Medication List       This list is accurate as of: 03/31/14  2:06 PM.  Always use your most recent med list.               BLACK COHOSH PO  Take by mouth daily.     chlorthalidone 25 MG tablet  Commonly known as:  HYGROTON     ciprofloxacin 500 MG tablet  Commonly known as:  CIPRO  Take 1 tablet (500 mg total) by mouth 2 (two) times daily.     CYSTEX PO  Take by mouth 2 (two) times daily. Has taken (6) pills since 5pm yesterday(09/24/13)     GLUCOSAMINE CHONDROITIN ADV PO  Take by mouth.     losartan 100 MG tablet  Commonly known as:  COZAAR  Take 1 tablet (100 mg total) by mouth daily.     multivitamin tablet  Take 1 tablet by mouth daily.     SYNTHROID 50 MCG tablet  Generic drug:  levothyroxine  TAKE 1 TABLET DAILY EVERY MORNING ON AN EMPTY STOMACH        Allergies:  Allergies  Allergen Reactions  . Hydrocodone Other (See Comments)    Makes me goofy    Past Medical History  Diagnosis Date  . Hypertension   . Thyroid disease      nodule, hypothyroidism  . Spinal stenosis     mild  . Arthritis     osteoarthritis  . Cancer     melanoma- right upper arm  .  Renal cyst     benign, found on CT scan, is being followed  . Osteomyelitis     left leg    Past Surgical History  Procedure Laterality Date  . Appendectomy  03/2011    laparoscopic  . Repair of rt med meniscal tear    . Skin cancer excision      right upper arm  . Left tibia      for osteomelitis of left tibia at age 29    Family History  Problem Relation Age of Onset  . Colon cancer Neg Hx   . Esophageal cancer Neg Hx   . Stomach cancer Neg Hx   . Rectal cancer Neg Hx   . Diabetes Mother     borderline type II  . Hypertension Father   . Hypertension Mother   . Hypertension Other     all grandparents  . Stroke Maternal Grandmother   . Stroke Paternal Grandfather   . Stroke Father   . Aneurysm Maternal Aunt 63  . Osteoporosis Maternal Grandmother   . Arthritis Mother  Social History:  reports that she quit smoking about 20 years ago. Her smoking use included Cigarettes. She smoked 0.00 packs per day. She has never used smokeless tobacco. She reports that she drinks about 12.0 ounces of alcohol per week. She reports that she does not use illicit drugs.  REVIEW Of SYSTEMS:  She is on antihypertensives and is followed by PCP  No recent symptoms of fatigue    Examination:   BP 138/86  Pulse 76  Temp(Src) 97.8 F (36.6 C)  Resp 14  Ht 5' 6.5" (1.689 m)  Wt 142 lb 9.6 oz (64.683 kg)  BMI 22.67 kg/m2  SpO2 97%  LMP 01/01/2008  GENERAL APPEARANCE: Averagely built and nourished, pleasant, not anxious  NECK: Thyroid is palpable on the left side and this appears to be about relatively diffusely enlarged, about twice normal  Right side is not palpable. No lymphadenopathy in the neck  NEUROLOGIC EXAM:  biceps reflexes show normal relaxation Skin: Not unusual dry    Assessment   History of multinodular goiter, long-standing She has been on low-dose Synthroid for suppression but not clear if this has helped reduce the size of the nodule. She may have had a  gradual increase in size of the nodule clinically   Plan:  Check thyroid levels today and if stable will have her followup in another year  Haileigh Pitz 03/31/2014, 2:06 PM   Addendum: TSH is now low with normal free T4. Will stop her Synthroid and recheck in 2 months. Consider thyroid nuclear scan if she continues to have a low TSH  Lab Results  Component Value Date   TSH 0.15* 03/31/2014

## 2014-04-13 ENCOUNTER — Other Ambulatory Visit (INDEPENDENT_AMBULATORY_CARE_PROVIDER_SITE_OTHER): Payer: 59 | Admitting: Internal Medicine

## 2014-04-13 VITALS — Temp 99.4°F

## 2014-04-13 DIAGNOSIS — N390 Urinary tract infection, site not specified: Secondary | ICD-10-CM

## 2014-04-13 DIAGNOSIS — R300 Dysuria: Secondary | ICD-10-CM

## 2014-04-13 LAB — POCT URINALYSIS DIPSTICK
BILIRUBIN UA: NEGATIVE
Glucose, UA: NEGATIVE
Ketones, UA: NEGATIVE
NITRITE UA: NEGATIVE
Spec Grav, UA: 1.015
Urobilinogen, UA: NEGATIVE
pH, UA: 6.5

## 2014-04-13 MED ORDER — CIPROFLOXACIN HCL 500 MG PO TABS: 500.0000 mg | ORAL_TABLET | Freq: Two times a day (BID) | ORAL | Status: DC

## 2014-04-13 NOTE — Patient Instructions (Signed)
Patient informed urine sent for culture. Per verbal order of Dr. Renold Genta, rx for Cipro 500 mg bid x 7 days, sent to [facility]. Recheck a urine in 10 days. Waiting on appointment with urologist.

## 2014-04-15 LAB — URINE CULTURE: Colony Count: >=100000

## 2014-04-15 NOTE — Progress Notes (Signed)
Patient informed. 

## 2014-04-20 ENCOUNTER — Encounter: Payer: Self-pay | Admitting: Internal Medicine

## 2014-04-20 ENCOUNTER — Other Ambulatory Visit (INDEPENDENT_AMBULATORY_CARE_PROVIDER_SITE_OTHER): Payer: 59 | Admitting: Internal Medicine

## 2014-04-20 VITALS — Temp 99.3°F

## 2014-04-20 DIAGNOSIS — N390 Urinary tract infection, site not specified: Secondary | ICD-10-CM

## 2014-04-20 LAB — POCT URINALYSIS DIPSTICK
BILIRUBIN UA: NEGATIVE
Blood, UA: NEGATIVE
Glucose, UA: NEGATIVE
Ketones, UA: NEGATIVE
Leukocytes, UA: NEGATIVE
Nitrite, UA: NEGATIVE
PH UA: 6
PROTEIN UA: NEGATIVE
Spec Grav, UA: 1.02
Urobilinogen, UA: NEGATIVE

## 2014-04-20 MED ORDER — CIPROFLOXACIN HCL 500 MG PO TABS: 500.0000 mg | ORAL_TABLET | Freq: Two times a day (BID) | ORAL | Status: DC

## 2014-04-20 NOTE — Patient Instructions (Signed)
Patient states she still has a little burning on urination. Is going out of town tomorrow. OK per Dr. Renold Genta to give Cipro 500 mg bid x 7 days. Has upcoming urology appointment.

## 2014-05-27 ENCOUNTER — Other Ambulatory Visit (INDEPENDENT_AMBULATORY_CARE_PROVIDER_SITE_OTHER): Payer: 59

## 2014-05-27 ENCOUNTER — Other Ambulatory Visit: Payer: Self-pay | Admitting: *Deleted

## 2014-05-27 DIAGNOSIS — E039 Hypothyroidism, unspecified: Secondary | ICD-10-CM

## 2014-05-27 LAB — TSH: TSH: 0.24 u[IU]/mL — ABNORMAL LOW (ref 0.35–4.50)

## 2014-05-27 LAB — T4, FREE: FREE T4: 0.69 ng/dL (ref 0.60–1.60)

## 2014-06-01 ENCOUNTER — Ambulatory Visit (INDEPENDENT_AMBULATORY_CARE_PROVIDER_SITE_OTHER): Payer: 59 | Admitting: Endocrinology

## 2014-06-01 ENCOUNTER — Ambulatory Visit: Payer: 59 | Admitting: Endocrinology

## 2014-06-01 ENCOUNTER — Encounter: Payer: Self-pay | Admitting: Endocrinology

## 2014-06-01 VITALS — BP 112/78 | HR 80 | Temp 98.6°F | Ht 66.5 in | Wt 140.0 lb

## 2014-06-01 DIAGNOSIS — R7989 Other specified abnormal findings of blood chemistry: Secondary | ICD-10-CM

## 2014-06-01 DIAGNOSIS — E042 Nontoxic multinodular goiter: Secondary | ICD-10-CM

## 2014-06-01 DIAGNOSIS — R946 Abnormal results of thyroid function studies: Secondary | ICD-10-CM

## 2014-06-01 NOTE — Progress Notes (Signed)
Patient ID: Tara Stanley, female   DOB: Oct 12, 1956, 58 y.o.   MRN: [id number]   Reason for Appointment:  Goiter, followup visit    History of Present Illness:   The goiter was first found in the year 2003 incidentally on a routine exam At that time she was in [place] and her endocrinologist evaluated her with serial ultrasounds She had multiple nodules with the largest one being 1.6 cm on the left side She was started on thyroid suppression Synthroid 50 mcg daily She did not feel any different with starting the thyroid supplement and her records do not indicate that she had hypothyroidism  The patient feels fairly well and has not noticed any swelling in her neck. Also has no symptoms of palpitations, shakiness or heat intolerance No difficulty swallowing, no choking sensation.  Her TSH in 01/2013 was 0.67 and she was continued on Synthroid 50 mcg Because of low TSH in 4/15 her Synthroid was stopped She did not feel any different with this  Lab Results  Component Value Date   FREET4 0.69 05/27/2014   FREET4 0.66 03/31/2014   TSH 0.24* 05/27/2014   TSH 0.15* 03/31/2014       Medication List       This list is accurate as of: 06/01/14 11:19 AM.  Always use your most recent med list.               BLACK COHOSH PO  Take by mouth daily.     chlorthalidone 25 MG tablet  Commonly known as:  HYGROTON     ciprofloxacin 500 MG tablet  Commonly known as:  CIPRO  Take 1 tablet (500 mg total) by mouth 2 (two) times daily.     CYSTEX PO  Take by mouth 2 (two) times daily. Has taken (6) pills since 5pm yesterday(09/24/13)     GLUCOSAMINE CHONDROITIN ADV PO  Take by mouth.     losartan 100 MG tablet  Commonly known as:  COZAAR  Take 1 tablet (100 mg total) by mouth daily.     multivitamin tablet  Take 1 tablet by mouth daily.     SYNTHROID 50 MCG tablet  Generic drug:  levothyroxine  TAKE 1 TABLET DAILY EVERY MORNING ON AN EMPTY STOMACH        Allergies:  Allergies   Allergen Reactions  . Hydrocodone Other (See Comments)    Makes me goofy    Past Medical History  Diagnosis Date  . Hypertension   . Thyroid disease      nodule, hypothyroidism  . Spinal stenosis     mild  . Arthritis     osteoarthritis  . Cancer     melanoma- right upper arm  . Renal cyst     benign, found on CT scan, is being followed  . Osteomyelitis     left leg    Past Surgical History  Procedure Laterality Date  . Appendectomy  03/2011    laparoscopic  . Repair of rt med meniscal tear    . Skin cancer excision      right upper arm  . Left tibia      for osteomelitis of left tibia at age 58    Family History  Problem Relation Age of Onset  . Colon cancer Neg Hx   . Esophageal cancer Neg Hx   . Stomach cancer Neg Hx   . Rectal cancer Neg Hx   . Thyroid disease Neg Hx   . Diabetes Mother  borderline type II  . Hypertension Mother   . Arthritis Mother   . Hypertension Father   . Stroke Father   . Hypertension Other     all grandparents  . Stroke Maternal Grandmother   . Osteoporosis Maternal Grandmother   . Stroke Paternal Grandfather   . Aneurysm Maternal Aunt 63    Social History:  reports that she quit smoking about 20 years ago. Her smoking use included Cigarettes. She smoked 0.00 packs per day. She has never used smokeless tobacco. She reports that she drinks about 12 ounces of alcohol per week. She reports that she does not use illicit drugs.  REVIEW Of SYSTEMS:  She is on antihypertensives and is followed by PCP  No recent symptoms of fatigue    Examination:   BP 112/78  Pulse 80  Temp(Src) 98.6 F (37 C) (Oral)  Ht 5' 6.5" (1.689 m)  Wt 140 lb (63.504 kg)  BMI 22.26 kg/m2  SpO2 96%  LMP 01/01/2008  GENERAL APPEARANCE: Averagely built and nourished, pleasant, not anxious  NECK: Thyroid is palpable on the left side, about twice normal, firm and smooth, slightly nodular.  Right side is just palpable and relatively soft. No  lymphadenopathy in the neck  NEUROLOGIC EXAM:  biceps reflexes show normal relaxation    Assessment   History of multinodular goiter, long-standing She had been on low-dose Synthroid for suppression but not clear if this helped reduce the size of the nodule. She now appears to have an autonomous thyroid since her TSH is still low even with stopping her Synthroid Does not appear to have hyperthyroidism, free T4 is the same without Synthroid The patient is having several questions about her diagnosis, natural history and what to expect   Plan:  Check thyroid levels in another 4 months  Consider I-131 scan if TSH relatively low Reminded her to have her bone density checked periodically and to treat accordingly  Elayne Snare 06/01/2014, 11:19 AM

## 2014-09-14 ENCOUNTER — Other Ambulatory Visit: Payer: Self-pay | Admitting: Internal Medicine

## 2014-09-16 ENCOUNTER — Telehealth: Payer: Self-pay | Admitting: *Deleted

## 2014-09-16 NOTE — Telephone Encounter (Signed)
Call to patient home and cell numbers. LM that due to emergency surgery, need to reschedule appointment time to 2pm. If this will not work, can call back and we will reschedule for suitable time.

## 2014-09-17 ENCOUNTER — Ambulatory Visit: Payer: 59 | Admitting: Obstetrics and Gynecology

## 2014-09-17 NOTE — Telephone Encounter (Signed)
Patient called and can not come today. Appt rescheduled to 09-29-14 at 1000.  Routing to provider for final review. Patient agreeable to disposition. Will close encounter

## 2014-09-21 ENCOUNTER — Other Ambulatory Visit (INDEPENDENT_AMBULATORY_CARE_PROVIDER_SITE_OTHER): Payer: 59

## 2014-09-21 DIAGNOSIS — E042 Nontoxic multinodular goiter: Secondary | ICD-10-CM

## 2014-09-21 DIAGNOSIS — R946 Abnormal results of thyroid function studies: Secondary | ICD-10-CM

## 2014-09-21 DIAGNOSIS — R7989 Other specified abnormal findings of blood chemistry: Secondary | ICD-10-CM

## 2014-09-21 LAB — TSH: TSH: 0.35 u[IU]/mL (ref 0.35–4.50)

## 2014-09-21 LAB — T3, FREE: T3, Free: 2.9 pg/mL (ref 2.3–4.2)

## 2014-09-21 LAB — T4, FREE: Free T4: 0.81 ng/dL (ref 0.60–1.60)

## 2014-09-24 ENCOUNTER — Ambulatory Visit (INDEPENDENT_AMBULATORY_CARE_PROVIDER_SITE_OTHER): Payer: 59 | Admitting: Endocrinology

## 2014-09-24 ENCOUNTER — Encounter: Payer: Self-pay | Admitting: Endocrinology

## 2014-09-24 VITALS — BP 142/98 | HR 85 | Temp 99.0°F | Wt 146.0 lb

## 2014-09-24 DIAGNOSIS — E042 Nontoxic multinodular goiter: Secondary | ICD-10-CM

## 2014-09-24 NOTE — Progress Notes (Signed)
Patient ID: Tara Stanley, female   DOB: [birth year], 58 y.o.   MRN: [id number]   Reason for Appointment:  Goiter, followup visit    History of Present Illness:   The goiter was first found in the year 2003 incidentally on a routine exam At that time she was in [place] and her endocrinologist evaluated her with serial ultrasounds She had multiple nodules with the largest one being 1.6 cm on the left side She was started on thyroid suppression Synthroid 50 mcg daily She did not feel any different with starting the thyroid supplement and her records do not indicate that she had hypothyroidism Because of low TSH in 4/15 her Synthroid was stopped  Subsequently her TSH level had continued to be low but it is back to normal now  The patient feels fairly well and does not usually notice any swelling in her neck. No symptoms of local discomfort, difficulty swallowing, no choking sensation.   Lab Results  Component Value Date   FREET4 0.81 09/21/2014   FREET4 0.69 05/27/2014   FREET4 0.66 03/31/2014   TSH 0.35 09/21/2014   TSH 0.24* 05/27/2014   TSH 0.15* 03/31/2014       Medication List       This list is accurate as of: 09/24/14 10:19 AM.  Always use your most recent med list.               Biotin 10 MG Caps  Take by mouth.     BLACK COHOSH PO  Take by mouth daily.     chlorthalidone 25 MG tablet  Commonly known as:  HYGROTON     Cranberry 125 MG Tabs  Take by mouth.     losartan 100 MG tablet  Commonly known as:  COZAAR  TAKE 1 TABLET DAILY     multivitamin tablet  Take 1 tablet by mouth daily.        Allergies:  Allergies  Allergen Reactions  . Hydrocodone Other (See Comments)    Makes me goofy    Past Medical History  Diagnosis Date  . Hypertension   . Thyroid disease      nodule, hypothyroidism  . Spinal stenosis     mild  . Arthritis     osteoarthritis  . Cancer     melanoma- right upper arm  . Renal cyst     benign, found on CT scan, is being  followed  . Osteomyelitis     left leg    Past Surgical History  Procedure Laterality Date  . Appendectomy  03/2011    laparoscopic  . Repair of rt med meniscal tear    . Skin cancer excision      right upper arm  . Left tibia      for osteomelitis of left tibia at age 71    Family History  Problem Relation Age of Onset  . Colon cancer Neg Hx   . Esophageal cancer Neg Hx   . Stomach cancer Neg Hx   . Rectal cancer Neg Hx   . Thyroid disease Neg Hx   . Diabetes Mother     borderline type II  . Hypertension Mother   . Arthritis Mother   . Hypertension Father   . Stroke Father   . Hypertension Other     all grandparents  . Stroke Maternal Grandmother   . Osteoporosis Maternal Grandmother   . Stroke Paternal Grandfather   . Aneurysm Maternal Aunt 63    Social  History:  reports that she quit smoking about 20 years ago. Her smoking use included Cigarettes. She smoked 0.00 packs per day. She has never used smokeless tobacco. She reports that she drinks about 12 ounces of alcohol per week. She reports that she does not use illicit drugs.  REVIEW Of SYSTEMS:  She is on antihypertensives and is followed by PCP  No recent symptoms of fatigue    Examination:   BP 142/98  Pulse 85  Temp(Src) 99 F (37.2 C) (Oral)  Wt 146 lb (66.225 kg)  SpO2 95%  LMP 01/01/2008  GENERAL APPEARANCE: Averagely built and nourished, pleasant   NECK: Thyroid is enlarged on the left side, about twice normal, firm and relatively smooth  Right side is barely palpable and relatively soft and slightly irregular. No lymphadenopathy in the neck  NEUROLOGIC EXAM:  biceps reflexes show normal relaxation    Assessment   History of multinodular goiter, long-standing She had been on low-dose Synthroid for suppression previously She probably has an autonomous thyroid since her TSH has been either low or low normal, now improved at 0.35   Her goiter is relatively small and stable in size, not  causing any local pressure symptoms   Plan:  Check thyroid levels in another 6 months and follow the goiter with clinical exams  Tara Stanley 09/24/2014, 10:19 AM

## 2014-09-29 ENCOUNTER — Encounter: Payer: Self-pay | Admitting: Obstetrics and Gynecology

## 2014-09-29 ENCOUNTER — Ambulatory Visit (INDEPENDENT_AMBULATORY_CARE_PROVIDER_SITE_OTHER): Payer: 59 | Admitting: Obstetrics and Gynecology

## 2014-09-29 VITALS — BP 122/72 | HR 88 | Resp 16 | Ht 66.5 in | Wt 146.0 lb

## 2014-09-29 DIAGNOSIS — Z01419 Encounter for gynecological examination (general) (routine) without abnormal findings: Secondary | ICD-10-CM

## 2014-09-29 MED ORDER — ESTRADIOL 2 MG VA RING
2.0000 mg | VAGINAL_RING | VAGINAL | Status: DC
Start: 2014-09-29 — End: 2015-03-14

## 2014-09-29 NOTE — Patient Instructions (Signed)

## 2014-09-29 NOTE — Progress Notes (Signed)
GYNECOLOGY VISIT  PCP: Tedra Senegal - MD  Referring provider:   HPI: 58 y.o.   Married  Caucasian  female   G2P2 with Patient's last menstrual period was 01/01/2008.   here for   Annual Gynecological Examination   Had recurrent UTIs last year. Saw urology for UTIs which were post coital.  Estring helpful.  Has been wanting to avoid estrogens up to date.   Does [facility] health program [facility] every year.  Has multiple CT scans in the recent past.  Started with appendicitis.  CT noted a mass on CT which resolved.  Now having yearly ultrasounds at [facility].   Hgb: PCP   Urine:  PCP  GYNECOLOGIC HISTORY: Patient's last menstrual period was 01/01/2008. Sexually active: yes  Partner preference: female Contraception:  Pos-menopausal  Menopausal hormone therapy: Estring 2 mg DES exposure: No   Blood transfusions:  No  Sexually transmitted diseases: No   GYN procedures and prior surgeries: None  Last mammogram: 12/2013 BIRADS1: Neg                Last pap and high risk HPV testing: 02/2012 Neg. HR HPV: neg    History of abnormal pap smear:  No   OB History   Grav Para Term Preterm Abortions TAB SAB Ect Mult Living   2 2        2        LIFESTYLE: Exercise: yes Cardio, resistant training 3 x weekly               OTHER HEALTH MAINTENANCE: Tetanus/TDap: 2010 HPV: No Influenza:  No   Bone density: 07/2010 - osteopenia.  Colonoscopy: 2013 - Normal - Repeat in 10 years   Cholesterol check: 2014  Family History  Problem Relation Age of Onset  . Colon cancer Neg Hx   . Esophageal cancer Neg Hx   . Stomach cancer Neg Hx   . Rectal cancer Neg Hx   . Thyroid disease Neg Hx   . Diabetes Mother     borderline type II  . Hypertension Mother   . Arthritis Mother   . Hypertension Father   . Stroke Father   . Hypertension Other     all grandparents  . Stroke Maternal Grandmother   . Osteoporosis Maternal Grandmother   . Stroke Paternal Grandfather   . Aneurysm Maternal Aunt  63    Patient Active Problem List   Diagnosis Date Noted  . Hypertension 02/17/2012  . Multinodular goiter 02/17/2012  . Spinal stenosis of lumbar region 02/17/2012   Past Medical History  Diagnosis Date  . Hypertension   . Thyroid disease      nodule, hypothyroidism  . Spinal stenosis     mild  . Arthritis     osteoarthritis  . Cancer     melanoma- right upper arm  . Renal cyst     benign, found on CT scan, is being followed  . Osteomyelitis     left leg    Past Surgical History  Procedure Laterality Date  . Appendectomy  03/2011    laparoscopic  . Repair of rt med meniscal tear    . Skin cancer excision      right upper arm  . Left tibia      for osteomelitis of left tibia at age 16    ALLERGIES: Hydrocodone  Current Outpatient Prescriptions  Medication Sig Dispense Refill  . Biotin 10 MG CAPS Take by mouth.      Marland Kitchen  BLACK COHOSH PO Take by mouth daily.      . chlorthalidone (HYGROTON) 25 MG tablet       . Cranberry 125 MG TABS Take by mouth.      . ESTRING 2 MG vaginal ring       . ibuprofen (ADVIL,MOTRIN) 200 MG tablet Take by mouth.      . losartan (COZAAR) 100 MG tablet TAKE 1 TABLET DAILY  90 tablet  0  . Multiple Vitamin (MULTIVITAMIN) tablet Take 1 tablet by mouth daily.       No current facility-administered medications for this visit.     ROS:  Pertinent items are noted in HPI.  History   Social History  . Marital Status: Married    Spouse Name: N/A    Number of Children: N/A  . Years of Education: N/A   Occupational History  . Not on file.   Social History Main Topics  . Smoking status: Former Smoker    Types: Cigarettes    Quit date: 02/04/1994  . Smokeless tobacco: Never Used  . Alcohol Use: 12.0 oz/week    20 Glasses of wine per week     Comment: wine-daily  . Drug Use: No  . Sexual Activity: Yes    Partners: Male    Birth Control/ Protection: Post-menopausal   Other Topics Concern  . Not on file   Social History Narrative   . No narrative on file    PHYSICAL EXAMINATION:    BP 122/72  Pulse 88  Resp 16  Ht 5' 6.5" (1.689 m)  Wt 146 lb (66.225 kg)  BMI 23.21 kg/m2  LMP 01/01/2008   Wt Readings from Last 3 Encounters:  09/29/14 146 lb (66.225 kg)  09/24/14 146 lb (66.225 kg)  06/01/14 140 lb (63.504 kg)     Ht Readings from Last 3 Encounters:  09/29/14 5' 6.5" (1.689 m)  06/01/14 5' 6.5" (1.689 m)  03/31/14 5' 6.5" (1.689 m)    General appearance: alert, cooperative and appears stated age Head: Normocephalic, without obvious abnormality, atraumatic Neck: no adenopathy, supple, symmetrical, trachea midline and thyroid not enlarged, symmetric, no tenderness/mass/nodules Lungs: clear to auscultation bilaterally Breasts: Inspection negative, No nipple retraction or dimpling, No nipple discharge or bleeding, No axillary or supraclavicular adenopathy, Normal to palpation without dominant masses Heart: regular rate and rhythm Abdomen: soft, non-tender; no masses,  no organomegaly Extremities: extremities normal, atraumatic, no cyanosis or edema Skin: Skin color, texture, turgor normal. No rashes or lesions Lymph nodes: Cervical, supraclavicular, and axillary nodes normal. No abnormal inguinal nodes palpated Neurologic: Grossly normal  Pelvic: External genitalia:  no lesions              Urethra:  normal appearing urethra with no masses, tenderness or lesions              Bartholins and Skenes: normal                 Vagina: normal appearing vagina with normal color and discharge, no lesions              Cervix: normal appearance              Pap and high risk HPV testing done: No.        Bimanual Exam:  Uterus:  uterus is normal size, shape, consistency and nontender  Adnexa: normal adnexa in size, nontender and no masses                                      Rectovaginal:  Yes.                                        Confirms above.                                       Anus:  normal sphincter tone, no lesions  ASSESSMENT  Normal gynecologic exam. History of recurrent UTIs due to atrophy.  Estring user.  Osteopenia.   PLAN  Mammogram recommended yearly starting at age 54. Pap smear and high risk HPV testing as above. Counseled on self breast exam, Calcium and vitamin D intake, exercise. Bone density through [facility].  Refill Estring for one year.   Discussed risk of DVT, PE, MI, stroke, breast cancer.  See lab orders: No. Labs through [facility].  Return annually or prn   An After Visit Summary was printed and given to the patient.

## 2014-11-01 ENCOUNTER — Encounter: Payer: Self-pay | Admitting: Obstetrics and Gynecology

## 2014-11-11 ENCOUNTER — Ambulatory Visit: Payer: 59 | Admitting: Internal Medicine

## 2014-11-11 ENCOUNTER — Ambulatory Visit (INDEPENDENT_AMBULATORY_CARE_PROVIDER_SITE_OTHER): Payer: 59 | Admitting: Internal Medicine

## 2014-11-11 ENCOUNTER — Encounter: Payer: Self-pay | Admitting: Internal Medicine

## 2014-11-11 DIAGNOSIS — M170 Bilateral primary osteoarthritis of knee: Secondary | ICD-10-CM

## 2014-11-11 DIAGNOSIS — I10 Essential (primary) hypertension: Secondary | ICD-10-CM

## 2014-11-11 NOTE — Progress Notes (Signed)
   Subjective:    Patient ID: Tara Stanley, female    DOB: [birth year], 58 y.o.   MRN: [id number]  HPI 58 year old Female with history of hypertension plans to go to [facility] to have executive physical in December.has been advised to have bilateral knee replacement by Dr. Maureen Ralphs. Tentatively has plans to do that in early March. She brings in x-rays of both knees showing loss of cartilage. She says her right knee is worse than her left. She wore high heels to an evening event and was in a lot of pain. It hurts to walk any distance. She's not able to play tennis. Not able to exercise like she would like to. Previously she was very active. She has some concerns about having both knees replaced at the same time, however on the other hand would like to get everything done at once.She understands she would need to be in rehabilitation for several days postoperatively and then have some help at home.   Review of Systems     Objective:   Physical Exam  Not examined. Spent 30 minutes speaking with her today and answering her questions and addressing her concerns. We originally had received medical clearance from Dr. Jacquiline Doe office. However, she says she is not willing to have physical exam today. Chest is clear. Cardiac exam regular rate and rhythm. Extremities without edema.      Assessment & Plan:  End- stage osteoarthritis both knees  Plan: Patient to consider bilateral knee replacement in the near future.  History of hypothyroidism- says recent TSH was normal off thyroid replacement medication for endocrinologist and she no longer needs to take thyroid replacement  Hypertension-stable. She has an element of office hypertension.  Plan: Patient plans to have physical exam at [facility] in December. She will discuss her concerns with them. She says they will give her medical clearance. She has been prescribed generic Mobile and  she should try that for pain. She says that she was taking  Advil twice a day and had some type of vasovagal syncope while taking Advil which is highly unusual.

## 2014-11-11 NOTE — Patient Instructions (Signed)
Try Mobic 15 mg daily for pain. To have physical exam at [facility] in December.

## 2014-11-20 ENCOUNTER — Other Ambulatory Visit: Payer: Self-pay | Admitting: Internal Medicine

## 2014-12-01 ENCOUNTER — Telehealth: Payer: Self-pay | Admitting: Internal Medicine

## 2014-12-01 ENCOUNTER — Encounter (HOSPITAL_COMMUNITY): Payer: Self-pay

## 2014-12-01 ENCOUNTER — Emergency Department (HOSPITAL_COMMUNITY)
Admission: EM | Admit: 2014-12-01 | Discharge: 2014-12-01 | Disposition: A | Discharge status: Home / Self Care | Payer: 59 | Source: Home / Self Care | Attending: Family Medicine | Admitting: Family Medicine

## 2014-12-01 DIAGNOSIS — N390 Urinary tract infection, site not specified: Secondary | ICD-10-CM

## 2014-12-01 LAB — POCT URINALYSIS DIP (DEVICE)
Bilirubin Urine: NEGATIVE
Glucose, UA: NEGATIVE mg/dL
Ketones, ur: NEGATIVE mg/dL
Nitrite: NEGATIVE
PROTEIN: NEGATIVE mg/dL
SPECIFIC GRAVITY, URINE: 1.02 (ref 1.005–1.030)
Urobilinogen, UA: 0.2 mg/dL (ref 0.0–1.0)
pH: 5.5 (ref 5.0–8.0)

## 2014-12-01 MED ORDER — CEPHALEXIN 500 MG PO CAPS: 500.0000 mg | ORAL_CAPSULE | Freq: Four times a day (QID) | ORAL | Status: DC

## 2014-12-01 NOTE — Telephone Encounter (Signed)
Pt texted me at 2:30 pm saying she had another UTI. Explained that I am not in office Wed afternoons. Suggested OV at Urgent care.

## 2014-12-01 NOTE — Discharge Instructions (Signed)
Take all of medicine as directed, drink lots of fluids, see your doctor if further problems. °

## 2014-12-01 NOTE — ED Notes (Signed)
C/o feels like another UTI.

## 2014-12-01 NOTE — ED Provider Notes (Signed)
CSN: [id number]     Arrival date & time 12/01/14  1844 History   First MD Initiated Contact with Patient 12/01/14 1847     Chief Complaint  Patient presents with  . Urinary Tract Infection   (Consider location/radiation/quality/duration/timing/severity/associated sxs/prior Treatment) Patient is a 58 y.o. female presenting with urinary tract infection. The history is provided by the patient.  Urinary Tract Infection This is a new problem. The current episode started 6 to 12 hours ago. The problem has been gradually worsening. Associated symptoms include abdominal pain. Pertinent negatives include no chest pain.    Past Medical History  Diagnosis Date  . Hypertension   . Thyroid disease      nodule, hypothyroidism  . Spinal stenosis     mild  . Arthritis     osteoarthritis  . Cancer     melanoma- right upper arm  . Renal cyst     benign, found on CT scan, is being followed  . Osteomyelitis     left leg   Past Surgical History  Procedure Laterality Date  . Appendectomy  03/2011    laparoscopic  . Repair of rt med meniscal tear    . Skin cancer excision      right upper arm  . Left tibia      for osteomelitis of left tibia at age 32   Family History  Problem Relation Age of Onset  . Colon cancer Neg Hx   . Esophageal cancer Neg Hx   . Stomach cancer Neg Hx   . Rectal cancer Neg Hx   . Thyroid disease Neg Hx   . Diabetes Mother     borderline type II  . Hypertension Mother   . Arthritis Mother   . Hypertension Father   . Stroke Father   . Hypertension Other     all grandparents  . Stroke Maternal Grandmother   . Osteoporosis Maternal Grandmother   . Stroke Paternal Grandfather   . Aneurysm Maternal Aunt 63   History  Substance Use Topics  . Smoking status: Former Smoker    Types: Cigarettes    Quit date: 02/04/1994  . Smokeless tobacco: Never Used  . Alcohol Use: 12.0 oz/week    20 Glasses of wine per week     Comment: wine-daily   OB History    Gravida Para Term Preterm AB TAB SAB Ectopic Multiple Living   2 2        2      Review of Systems  Constitutional: Negative.   Cardiovascular: Negative for chest pain.  Gastrointestinal: Positive for abdominal pain. Negative for nausea and vomiting.  Genitourinary: Positive for dysuria, urgency, frequency and pelvic pain. Negative for hematuria, flank pain, vaginal bleeding, vaginal discharge and menstrual problem.    Allergies  Hydrocodone  Home Medications   Prior to Admission medications   Medication Sig Start Date End Date Taking? Authorizing Provider  Biotin 10 MG CAPS Take by mouth.    Historical Provider, MD  BLACK COHOSH PO Take by mouth daily.    Historical Provider, MD  cephALEXin (KEFLEX) 500 MG capsule Take 1 capsule (500 mg total) by mouth 4 (four) times daily. Take all of medicine and drink lots of fluids 12/01/14   Billy Fischer, MD  chlorthalidone (HYGROTON) 25 MG tablet  01/26/14   Historical Provider, MD  Cranberry 125 MG TABS Take by mouth.    Historical Provider, MD  estradiol (ESTRING) 2 MG vaginal ring Place 2 mg vaginally every  3 (three) months. 09/29/14   [place], MD  ibuprofen (ADVIL,MOTRIN) 200 MG tablet Take by mouth.    Historical Provider, MD  losartan (COZAAR) 100 MG tablet TAKE 1 TABLET DAILY 11/20/14   Elby Showers, MD  meloxicam [facility]) 7.5 MG tablet  09/30/14   Historical Provider, MD  Multiple Vitamin (MULTIVITAMIN) tablet Take 1 tablet by mouth daily.    Historical Provider, MD   BP 136/83 mmHg  Pulse 92  Temp(Src) 99.1 F (37.3 C) (Oral)  Resp 15  SpO2 99%  LMP 01/01/2008 Physical Exam  Constitutional: She is oriented to person, place, and time. She appears well-developed and well-nourished. No distress.  Cardiovascular: Normal heart sounds and intact distal pulses.   Pulmonary/Chest: Effort normal and breath sounds normal.  Abdominal: Soft. Bowel sounds are normal. She exhibits no distension and no mass. There is  tenderness in the suprapubic area. There is no rebound, no guarding and no CVA tenderness.  Neurological: She is alert and oriented to person, place, and time.  Skin: Skin is warm and dry.  Nursing note and vitals reviewed.   ED Course  Procedures (including critical care time) Labs Review Labs Reviewed  POCT URINALYSIS DIP (DEVICE) - Abnormal; Notable for the following:    Hgb urine dipstick LARGE (*)    Leukocytes, UA LARGE (*)    All other components within normal limits    Imaging Review No results found.   MDM   1. UTI (lower urinary tract infection)        Billy Fischer, MD 12/01/14 [phone]

## 2015-01-05 ENCOUNTER — Telehealth: Payer: Self-pay | Admitting: Internal Medicine

## 2015-01-05 NOTE — Telephone Encounter (Signed)
Received records from [facility] (Dr Hector Shade) for appointment with Dr Debara Pickett on 01/06/15.  Records given to [facility] (medical records) for Dr Lysbeth Penner schedule on 01/06/15.  lp

## 2015-01-06 ENCOUNTER — Encounter: Payer: Self-pay | Admitting: Internal Medicine

## 2015-01-06 ENCOUNTER — Ambulatory Visit (INDEPENDENT_AMBULATORY_CARE_PROVIDER_SITE_OTHER): Payer: 59 | Admitting: Internal Medicine

## 2015-01-06 VITALS — BP 136/90 | HR 82 | Ht 66.5 in | Wt 149.4 lb

## 2015-01-06 DIAGNOSIS — I10 Essential (primary) hypertension: Secondary | ICD-10-CM

## 2015-01-06 DIAGNOSIS — Z01810 Encounter for preprocedural cardiovascular examination: Secondary | ICD-10-CM | POA: Insufficient documentation

## 2015-01-06 NOTE — Progress Notes (Signed)
OFFICE NOTE  Chief Complaint:  Preoperative cardiovascular risk assessment  Primary Care Physician: Elby Showers, MD  HPI:  Tara Stanley is a pleasant 59 year old females currently referred to me for preoperative cardiac risk assessment. She is contemplating undergoing knee replacement surgery. This is scheduled to be bilateral and performed concomitantly. This is considered a long operation at higher risk. Based and that it was felt that she should undergo cardiac risk assessment. She gets primary care both locally and through executive medical physicals at [facility]. She recently saw her physician there and had an EKG which I reviewed and shows normal sinus rhythm from 12/14/2014. There is no evidence for ischemic EKG changes or abnormalities in the axis or intervals. She denies any history of chest pain or shortness of breath. Despite her knee limitation she still able to exercise on elliptical and stationary bike and achieved more than 4 metabolic: Some exercise without difficulty. This would put her at low risk for surgery. We have hypertension with mildly elevated diastolic pressures. There is been discussion with her primary care provider about potentially changing her blood pressure medications in the near future.  PMHx:  Past Medical History  Diagnosis Date  . Hypertension   . Thyroid disease      nodule, hypothyroidism  . Spinal stenosis     mild  . Arthritis     osteoarthritis  . Cancer     melanoma- right upper arm  . Renal cyst     benign, found on CT scan, is being followed  . Osteomyelitis     left leg    Past Surgical History  Procedure Laterality Date  . Appendectomy  03/2011    laparoscopic  . Repair of rt med meniscal tear    . Skin cancer excision      right upper arm  . Left tibia      for osteomelitis of left tibia at age 65    FAMHx:  Family History  Problem Relation Age of Onset  . Colon cancer Neg Hx   . Esophageal cancer Neg Hx   .  Stomach cancer Neg Hx   . Rectal cancer Neg Hx   . Thyroid disease Neg Hx   . Diabetes Mother     borderline type II  . Hypertension Mother   . Arthritis Mother   . Hypertension Father   . Stroke Father   . Hypertension Other     all grandparents  . Stroke Maternal Grandmother   . Osteoporosis Maternal Grandmother   . Stroke Paternal Grandfather   . Aneurysm Maternal Aunt 63    SOCHx:   reports that she quit smoking about 20 years ago. Her smoking use included Cigarettes. She smoked 0.00 packs per day. She has never used smokeless tobacco. She reports that she drinks about 12.0 oz of alcohol per week. She reports that she does not use illicit drugs.  ALLERGIES:  Allergies  Allergen Reactions  . Hydrocodone Other (See Comments)    Makes me goofy    ROS: A comprehensive review of systems was negative except for: Musculoskeletal: positive for arthralgias  HOME MEDS: Current Outpatient Prescriptions  Medication Sig Dispense Refill  . Biotin 10 MG CAPS Take by mouth.    Marland Kitchen BLACK COHOSH PO Take by mouth daily.    . cephALEXin (KEFLEX) 500 MG capsule Take 1 capsule (500 mg total) by mouth 4 (four) times daily. Take all of medicine and drink lots of fluids 20 capsule 0  .  chlorthalidone (HYGROTON) 25 MG tablet     . Cranberry 125 MG TABS Take by mouth.    . estradiol (ESTRING) 2 MG vaginal ring Place 2 mg vaginally every 3 (three) months. 1 each 3  . losartan (COZAAR) 100 MG tablet TAKE 1 TABLET DAILY 90 tablet 3  . meloxicam (MOBIC) 7.5 MG tablet   5  . Multiple Vitamin (MULTIVITAMIN) tablet Take 1 tablet by mouth daily.     No current facility-administered medications for this visit.    LABS/IMAGING: No results found for this or any previous visit (from the past 48 hour(s)). No results found.  VITALS: BP 136/90 mmHg  Pulse 82  Ht 5' 6.5" (1.689 m)  Wt 149 lb 6.4 oz (67.767 kg)  BMI 23.76 kg/m2  LMP 01/01/2008  EXAM: General appearance: alert and no distress Neck:  no carotid bruit, no JVD and thyroid not enlarged, symmetric, no tenderness/mass/nodules Lungs: clear to auscultation bilaterally Heart: regular rate and rhythm, S1, S2 normal, no murmur, click, rub or gallop Abdomen: soft, non-tender; bowel sounds normal; no masses,  no organomegaly Extremities: extremities normal, atraumatic, no cyanosis or edema Pulses: 2+ and symmetric Skin: Skin color, texture, turgor normal. No rashes or lesions Neurologic: Grossly normal Psych: Normal  EKG: Normal sinus rhythm at 71 (reviewed from [facility] on 12/14/2014)  ASSESSMENT: 1. Low risk for upcoming bilateral knee replacement surgery 2. Hypertension  PLAN: 1.   [name] is at low risk for upcoming knee replacement surgery. She has no history of cardiac problems and few cardiac risk factors. Her hypertension is fairly well-controlled. She can easily do more than 4 metabolic equivalent activities without any limitations. Her EKG at rest is normal. I have no hesitation for recommending her to undergo surgery from a cardiac standpoint.  Thank you as always for the consultation. If I can be of further assistance please don't hesitate to contact me.  Pixie Casino, MD, [facility] Attending Cardiologist [facility]  HILTY,Kenneth C 01/06/2015, 3:34 PM

## 2015-01-06 NOTE — Patient Instructions (Signed)
Your physician recommends that you schedule a follow-up appointment as needed  

## 2015-01-11 ENCOUNTER — Telehealth: Payer: Self-pay | Admitting: Internal Medicine

## 2015-01-11 NOTE — Telephone Encounter (Signed)
She picked up a Zostavax Rx last January per our files, but she has lost it because she had to hold it until she turned "19" before she could get the shot (which was 10/2014).  Would you mind to please write her a new prescription so she can pick it up and get the vaccine now?  Thanks.

## 2015-01-11 NOTE — Telephone Encounter (Signed)
Spoke with patient to advise her Rx is ready for pick up and we are open until 5pm.

## 2015-01-11 NOTE — Telephone Encounter (Signed)
New Rx written

## 2015-02-08 ENCOUNTER — Ambulatory Visit: Payer: Self-pay | Admitting: Orthopedic Surgery

## 2015-02-08 NOTE — Progress Notes (Signed)
Preoperative surgical orders have been place into the Epic hospital system for Tara Stanley on 02/08/2015, 11:48 AM  by Mickel Crow for surgery on 03-09-2015.  Preop Bilateral Total Knee orders including Epidural per Anesthesia, IV Tylenol, and IV Decadron as long as there are no contraindications to the above medications. Arlee Muslim, PA-C

## 2015-02-15 ENCOUNTER — Ambulatory Visit: Payer: Self-pay | Admitting: Orthopedic Surgery

## 2015-02-25 NOTE — Progress Notes (Addendum)
Clearance note per chart Dr Lyman Bishop 01/06/2015  EKG per epic 12/14/2014 LOV Dr Debara Pickett per epic 01/06/2015  CXR per chart 12/14/2014  [place] [facility] note per chart 12/15/2014 per Dr Linward Foster  Stress test per epic 11/24/2013

## 2015-02-28 ENCOUNTER — Encounter (HOSPITAL_COMMUNITY)
Admission: RE | Admit: 2015-02-28 | Discharge: 2015-02-28 | Disposition: A | Discharge status: Home / Self Care | Payer: 59 | Source: Ambulatory Visit | Attending: Orthopedic Surgery | Admitting: Orthopedic Surgery

## 2015-02-28 ENCOUNTER — Other Ambulatory Visit: Payer: Self-pay

## 2015-02-28 ENCOUNTER — Encounter (HOSPITAL_COMMUNITY): Payer: Self-pay

## 2015-02-28 DIAGNOSIS — M170 Bilateral primary osteoarthritis of knee: Secondary | ICD-10-CM | POA: Insufficient documentation

## 2015-02-28 DIAGNOSIS — Z01812 Encounter for preprocedural laboratory examination: Secondary | ICD-10-CM | POA: Insufficient documentation

## 2015-02-28 HISTORY — DX: Personal history of urinary (tract) infections: Z87.440

## 2015-02-28 LAB — URINE MICROSCOPIC-ADD ON

## 2015-02-28 LAB — URINALYSIS, ROUTINE W REFLEX MICROSCOPIC
BILIRUBIN URINE: NEGATIVE
Glucose, UA: NEGATIVE mg/dL
Ketones, ur: NEGATIVE mg/dL
Leukocytes, UA: NEGATIVE
Nitrite: NEGATIVE
PROTEIN: NEGATIVE mg/dL
SPECIFIC GRAVITY, URINE: 1.007 (ref 1.005–1.030)
Urobilinogen, UA: 0.2 mg/dL (ref 0.0–1.0)
pH: 7 (ref 5.0–8.0)

## 2015-02-28 LAB — COMPREHENSIVE METABOLIC PANEL
ALK PHOS: 61 U/L (ref 39–117)
ALT: 22 U/L (ref 0–35)
AST: 25 U/L (ref 0–37)
Albumin: 5.1 g/dL (ref 3.5–5.2)
Anion gap: 11 (ref 5–15)
BUN: 20 mg/dL (ref 6–23)
CO2: 28 mmol/L (ref 19–32)
Calcium: 9.9 mg/dL (ref 8.4–10.5)
Chloride: 99 mmol/L (ref 96–112)
Creatinine, Ser: 0.53 mg/dL (ref 0.50–1.10)
GFR calc Af Amer: >90 mL/min (ref >90)
GFR calc non Af Amer: >90 mL/min (ref >90)
Glucose, Bld: 107 mg/dL — ABNORMAL HIGH (ref 70–99)
Potassium: 4.3 mmol/L (ref 3.5–5.1)
Sodium: 138 mmol/L (ref 135–145)
Total Bilirubin: 1.3 mg/dL — ABNORMAL HIGH (ref 0.3–1.2)
Total Protein: 7.8 g/dL (ref 6.0–8.3)

## 2015-02-28 LAB — CBC
HEMATOCRIT: 43.8 % (ref 36.0–46.0)
HEMOGLOBIN: 14.4 g/dL (ref 12.0–15.0)
MCH: 31.4 pg (ref 26.0–34.0)
MCHC: 32.9 g/dL (ref 30.0–36.0)
MCV: 95.6 fL (ref 78.0–100.0)
Platelets: 269 10*3/uL (ref 150–400)
RBC: 4.58 MIL/uL (ref 3.87–5.11)
RDW: 12.6 % (ref 11.5–15.5)
WBC: 7.3 10*3/uL (ref 4.0–10.5)

## 2015-02-28 LAB — PROTIME-INR
INR: 0.94 (ref 0.00–1.49)
Prothrombin Time: 12.7 seconds (ref 11.6–15.2)

## 2015-02-28 LAB — APTT: APTT: 27 s (ref 24–37)

## 2015-02-28 LAB — SURGICAL PCR SCREEN
MRSA, PCR: POSITIVE — AB
Staphylococcus aureus: POSITIVE — AB

## 2015-02-28 NOTE — Patient Instructions (Signed)
[place]  02/28/2015   Your procedure is scheduled on:      Wednesday March 09, 2015   Report to [facility] Main Entrance and follow signs to  [facility] arrive at 8:45 AM.   Call this number if you have problems the morning of surgery [phone] or Presurgical Testing [phone].   Remember:  Do not eat food or drink liquids :After Midnight.    Take these medicines the morning of surgery with A SIP OF WATER: NONE                               You may not have any metal on your body including hair pins and piercings  Do not wear jewelry, make-up, lotions, powders,prefumes or deodorant.  Do not shave body hair  48 hours(2 days) of CHG soap use.                Do not bring valuables to the hospital. [place].  Contacts, dentures or bridgework may not be worn into surgery.  Leave suitcase in the car. After surgery it may be brought to your room.  For patients admitted to the hospital, checkout time is 11:00 AM the day of discharge.      Special Instructions: review fact sheets for MRSA information, Blood Transfusion fact sheet, Incentive Spirometry.  Remember: Type/Screen "Blue armsbands"- may not be removed once applied(would result in being retested AM of surgery, if removed). ________________________________________________________________________  [facility] - Preparing for Surgery Before surgery, you can play an important role.  Because skin is not sterile, your skin needs to be as free of germs as possible.  You can reduce the number of germs on your skin by washing with CHG (chlorahexidine gluconate) soap before surgery.  CHG is an antiseptic cleaner which kills germs and bonds with the skin to continue killing germs even after washing. Please DO NOT use if you have an allergy to CHG or antibacterial soaps.  If your skin becomes reddened/irritated stop using the CHG and inform your nurse when you arrive at Short  Stay. Do not shave (including legs and underarms) for at least 48 hours prior to the first CHG shower.  You may shave your face/neck. Please follow these instructions carefully:  1.  Shower with CHG Soap the night before surgery and the  morning of Surgery.  2.  If you choose to wash your hair, wash your hair first as usual with your  normal  shampoo.  3.  After you shampoo, rinse your hair and body thoroughly to remove the  shampoo.                           4.  Use CHG as you would any other liquid soap.  You can apply chg directly  to the skin and wash                       Gently with a scrungie or clean washcloth.  5.  Apply the CHG Soap to your body ONLY FROM THE NECK DOWN.   Do not use on face/ open                           Wound or open sores. Avoid contact with eyes, ears mouth and genitals (  private parts).                       Wash face,  Genitals (private parts) with your normal soap.             6.  Wash thoroughly, paying special attention to the area where your surgery  will be performed.  7.  Thoroughly rinse your body with warm water from the neck down.  8.  DO NOT shower/wash with your normal soap after using and rinsing off  the CHG Soap.                9.  Pat yourself dry with a clean towel.            10.  Wear clean pajamas.            11.  Place clean sheets on your bed the night of your first shower and do not  sleep with pets. Day of Surgery : Do not apply any lotions/deodorants the morning of surgery.  Please wear clean clothes to the hospital/surgery center.  FAILURE TO FOLLOW THESE INSTRUCTIONS MAY RESULT IN THE CANCELLATION OF YOUR SURGERY PATIENT SIGNATURE_________________________________  NURSE SIGNATURE__________________________________  ________________________________________________________________________   Tara Stanley  An incentive spirometer is a tool that can help keep your lungs clear and active. This tool measures how well you are  filling your lungs with each breath. Taking long deep breaths may help reverse or decrease the chance of developing breathing (pulmonary) problems (especially infection) following:  A long period of time when you are unable to move or be active. BEFORE THE PROCEDURE   If the spirometer includes an indicator to show your best effort, your nurse or respiratory therapist will set it to a desired goal.  If possible, sit up straight or lean slightly forward. Try not to slouch.  Hold the incentive spirometer in an upright position. INSTRUCTIONS FOR USE   Sit on the edge of your bed if possible, or sit up as far as you can in bed or on a chair.  Hold the incentive spirometer in an upright position.  Breathe out normally.  Place the mouthpiece in your mouth and seal your lips tightly around it.  Breathe in slowly and as deeply as possible, raising the piston or the ball toward the top of the column.  Hold your breath for 3-5 seconds or for as long as possible. Allow the piston or ball to fall to the bottom of the column.  Remove the mouthpiece from your mouth and breathe out normally.  Rest for a few seconds and repeat Steps 1 through 7 at least 10 times every 1-2 hours when you are awake. Take your time and take a few normal breaths between deep breaths.  The spirometer may include an indicator to show your best effort. Use the indicator as a goal to work toward during each repetition.  After each set of 10 deep breaths, practice coughing to be sure your lungs are clear. If you have an incision (the cut made at the time of surgery), support your incision when coughing by placing a pillow or rolled up towels firmly against it. Once you are able to get out of bed, walk around indoors and cough well. You may stop using the incentive spirometer when instructed by your caregiver.  RISKS AND COMPLICATIONS  Take your time so you do not get dizzy or light-headed.  If you are in pain, you may  need to take or ask for pain medication before doing incentive spirometry. It is harder to take a deep breath if you are having pain. AFTER USE  Rest and breathe slowly and easily.  It can be helpful to keep track of a log of your progress. Your caregiver can provide you with a simple table to help with this. If you are using the spirometer at home, follow these instructions: [place] IF:   You are having difficultly using the spirometer.  You have trouble using the spirometer as often as instructed.  Your pain medication is not giving enough relief while using the spirometer.  You develop fever of 100.5 F (38.1 C) or higher. SEEK IMMEDIATE MEDICAL CARE IF:   You cough up bloody sputum that had not been present before.  You develop fever of 102 F (38.9 C) or greater.  You develop worsening pain at or near the incision site. MAKE SURE YOU:   Understand these instructions.  Will watch your condition.  Will get help right away if you are not doing well or get worse. Document Released: 04/29/2007 Document Revised: 03/10/2012 Document Reviewed: 06/30/2007 ExitCare Patient Information 2014 ExitCare, [place].   ________________________________________________________________________  WHAT IS A BLOOD TRANSFUSION? Blood Transfusion Information  A transfusion is the replacement of blood or some of its parts. Blood is made up of multiple cells which provide different functions.  Red blood cells carry oxygen and are used for blood loss replacement.  White blood cells fight against infection.  Platelets control bleeding.  Plasma helps clot blood.  Other blood products are available for specialized needs, such as hemophilia or other clotting disorders. BEFORE THE TRANSFUSION  Who gives blood for transfusions?   Healthy volunteers who are fully evaluated to make sure their blood is safe. This is blood bank blood. Transfusion therapy is the safest it has ever been in  the practice of medicine. Before blood is taken from a donor, a complete history is taken to make sure that person has no history of diseases nor engages in risky social behavior (examples are intravenous drug use or sexual activity with multiple partners). The donor's travel history is screened to minimize risk of transmitting infections, such as malaria. The donated blood is tested for signs of infectious diseases, such as HIV and hepatitis. The blood is then tested to be sure it is compatible with you in order to minimize the chance of a transfusion reaction. If you or a relative donates blood, this is often done in anticipation of surgery and is not appropriate for emergency situations. It takes many days to process the donated blood. RISKS AND COMPLICATIONS Although transfusion therapy is very safe and saves many lives, the main dangers of transfusion include:   Getting an infectious disease.  Developing a transfusion reaction. This is an allergic reaction to something in the blood you were given. Every precaution is taken to prevent this. The decision to have a blood transfusion has been considered carefully by your caregiver before blood is given. Blood is not given unless the benefits outweigh the risks. AFTER THE TRANSFUSION  Right after receiving a blood transfusion, you will usually feel much better and more energetic. This is especially true if your red blood cells have gotten low (anemic). The transfusion raises the level of the red blood cells which carry oxygen, and this usually causes an energy increase.  The nurse administering the transfusion will monitor you carefully for complications. HOME CARE INSTRUCTIONS  No special instructions  are needed after a transfusion. You may find your energy is better. Speak with your caregiver about any limitations on activity for underlying diseases you may have. SEEK MEDICAL CARE IF:   Your condition is not improving after your  transfusion.  You develop redness or irritation at the intravenous (IV) site. SEEK IMMEDIATE MEDICAL CARE IF:  Any of the following symptoms occur over the next 12 hours:  Shaking chills.  You have a temperature by mouth above 102 F (38.9 C), not controlled by medicine.  Chest, back, or muscle pain.  People around you feel you are not acting correctly or are confused.  Shortness of breath or difficulty breathing.  Dizziness and fainting.  You get a rash or develop hives.  You have a decrease in urine output.  Your urine turns a dark color or changes to pink, red, or brown. Any of the following symptoms occur over the next 10 days:  You have a temperature by mouth above 102 F (38.9 C), not controlled by medicine.  Shortness of breath.  Weakness after normal activity.  The white part of the eye turns yellow (jaundice).  You have a decrease in the amount of urine or are urinating less often.  Your urine turns a dark color or changes to pink, red, or brown. Document Released: 12/14/2000 Document Revised: 03/10/2012 Document Reviewed: 08/02/2008 [facility] Patient Information 2014 [place], [place].  _______________________________________________________________________

## 2015-02-28 NOTE — Progress Notes (Signed)
U/A with micro results faxed via EPIC to Dr Wynelle Link.

## 2015-03-01 ENCOUNTER — Ambulatory Visit: Payer: Self-pay | Admitting: Orthopedic Surgery

## 2015-03-01 MED ORDER — CEFAZOLIN SODIUM-DEXTROSE 2-3 GM-% IV SOLR: 2.0000 g | INTRAVENOUS | Status: DC

## 2015-03-04 ENCOUNTER — Ambulatory Visit: Payer: Self-pay | Admitting: Endocrinology

## 2015-03-08 ENCOUNTER — Other Ambulatory Visit: Payer: Self-pay | Admitting: Orthopedic Surgery

## 2015-03-08 ENCOUNTER — Ambulatory Visit: Payer: Self-pay | Admitting: Orthopedic Surgery

## 2015-03-08 NOTE — H&P (Signed)
Tara Stanley DOB: 02/18/1956 Married / Language: English / Race: White Female Date of Admission:  03/09/2015 CC: Bilateral Knee Pain History of Present Illness (Tara Stanley; 02/27/2015 5:44 PM) The patient is a 59 year old female who comes in for a preoperative History and Physical. The patient is scheduled for a bilateral total knee arthroplasty to be performed by Dr. Dione Plover. Aluisio, Stanley at [facility] on 03-09-2015. The patient is a 59 year old female who presents for follow up of their knee. The patient is being followed for their bilateral knee pain and osteoarthritis. They are now several months out from Tara Stanley series. Symptoms reported today include: pain and aching. The patient feels that they are doing poorly. Current treatment includes: activity modification. The patient has not gotten any relief of their symptoms with viscosupplementation. The knees have gotten progressively worse. She states she can not do anything she desires with respect to activity level. She said she can not wear heels at all. She can not go walking any more. She can not play tennis. She is very frustrated with her knees. She is not a great candidate for unicompartmental replacement as she has a significant laxity in both knees. Also, she has significant patellofemoral degeneration. At this point, the most predictable means of improving pain and function will be a total knee arthroplasty. She is not thrilled to hear this but realizes that this is the only thing that will predictably get her better. She is a great candidate to do both at the same time. We discussed a knee replacement in detail, procedure, risks and rehab course. We discussed the pros and cons of doing both at the same time versus one at a time. She is definitely young and healthy enough to do both at the same time. She would prefer to do it that way. She elects to proceed with surgery for both knees at this time. They have been  treated conservatively in the past for the above stated problem and despite conservative measures, they continue to have progressive pain and severe functional limitations and dysfunction. They have failed non-operative management including home exercise, medications, and injections. It is felt that they would benefit from undergoing total joint replacements. Risks and benefits of the procedure have been discussed with the patient and they elect to proceed with surgery. There are no active contraindications to surgery such as ongoing infection or rapidly progressive neurological disease.  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) Hypothyroidism Osteoarthritis Skin Cancer High blood pressure Hypothyroidism Osteoarthritis Skin Cancer High blood pressure  Allergies No Known Drug Allergies  Family History (Tara Stanley, Stanley Stanley; 02/15/2015 3:47 PM) Cerebrovascular Accident father, grandfather mothers side and grandmother fathers side Chronic Obstructive Lung Disease mother Cancer grandfather mothers side Hypertension mother and father Osteoarthritis mother, sister and grandfather mothers side Osteoporosis grandmother mothers side  Social History (Tara Benner Monika Salk, Stanley Stanley; 02/15/2015 3:47 PM) Drug/Alcohol Rehab (Currently) no Exercise Exercises daily; does running / walking, individual sport, gym / weights and team sport Alcohol use Occasional alcohol use. current drinker; drinks wine; 15 or more per week Tobacco use Former smoker. former smoker; smoke(d) 3 or more pack(s) per day Illicit drug use no Number of flights of stairs before winded greater than 5 Pain Contract no Living situation live with spouse Children 2 Previously in rehab no Most recent primary occupation Nurse, adult, serve on  local boards Marital status  married Tobacco / smoke exposure no  Medication History (Tara Stanley, Stanley Stanley; 02/15/2015 3:49 PM) Meloxicam (15MG  Tablet, 1 (one) Tablet Oral daily, Taken starting 09/30/2014) Active. Biotin (Oral) Specific dose unknown - Active. Losartan Potassium (100MG  Tablet, Oral) Active. Chlorthalidone (25MG  Tablet, Oral) Active. Centrum Silver Ultra Womens (Oral) Active. Black Cohosh Root (Oral) Specific dose unknown - Active. Cranberry Extract (Oral) Specific dose unknown - Active. Estring (2MG  Ring, Vaginal) Active.  Past Surgical History (Kaedynce Tapp L Melvine Julin, Stanley Stanley; 02/15/2015 3:47 PM) Appendectomy Arthroscopy of Knee right   Review of Systems (Tara Stanley; 02/15/2015 4:29 PM) General Not Present- Chills, Fatigue, Fever, Memory Loss, Night Sweats, Weight Gain and Weight Loss. Skin Not Present- Eczema, Hives, Itching, Lesions and Rash. HEENT Not Present- Dentures, Double Vision, Headache, Hearing Loss, Tinnitus and Visual Loss. Respiratory Not Present- Allergies, Chronic Cough, Coughing up blood, Shortness of breath at rest and Shortness of breath with exertion. Cardiovascular Not Present- Chest Pain, Difficulty Breathing Lying Down, Murmur, Palpitations, Racing/skipping heartbeats and Swelling. Gastrointestinal Not Present- Abdominal Pain, Bloody Stool, Constipation, Diarrhea, Difficulty Swallowing, Heartburn, Jaundice, Loss of appetitie, Nausea and Vomiting. Female Genitourinary Not Present- Blood in Urine, Discharge, Flank Pain, Incontinence, Painful Urination, Urgency, Urinary frequency, Urinary Retention, Urinating at Night and Weak urinary stream. Musculoskeletal Present- Joint Pain. Not Present- Back Pain, Joint Swelling, Morning Stiffness, Muscle Pain, Muscle Weakness and Spasms. Neurological Not Present- Blackout spells, Difficulty with balance, Dizziness, Paralysis, Tremor and Weakness. Psychiatric Not Present- Insomnia.   Vitals  (Tara Stanley; 02/15/2015 4:25 PM) 02/15/2015 3:32 PM Weight: 150.13 lb Height: 67in Body Surface Area: 1.79 m Body Mass Index: 23.51 kg/m  Pulse: 81 (Regular)  BP: 141/86 (Sitting, Left Arm, Standard)  Physical Exam (Hien Cunliffe L. Sarim Rothman Stanley Stanley; 02/15/2015 3:45 PM) General Mental Status -Alert, cooperative and good historian. General Appearance-pleasant, Not in acute distress. Orientation-Oriented X3. Build & Nutrition-Well nourished and Well developed.  Head and Neck Head-normocephalic, atraumatic . Neck Global Assessment - supple, no bruit auscultated on the right, no bruit auscultated on the left.  Eye Pupil - Bilateral-Regular and Round. Motion - Bilateral-EOMI.  Chest and Lung Exam Auscultation Breath sounds - clear at anterior chest wall and clear at posterior chest wall. Adventitious sounds - No Adventitious sounds.  Cardiovascular Auscultation Rhythm - Regular rate and rhythm. Heart Sounds - S1 WNL and S2 WNL. Murmurs & Other Heart Sounds - Auscultation of the heart reveals - No Murmurs.  Abdomen Palpation/Percussion Tenderness - Abdomen is non-tender to palpation. Rigidity (guarding) - Abdomen is soft. Auscultation Auscultation of the abdomen reveals - Bowel sounds normal.  Female Genitourinary Note: Not done, not pertinent to present illness   Musculoskeletal Note: She is a well developed female. She is alert and oriented, in no apparent distress. Both hips show a normal range of motion with no discomfort. The left knee shows no effusion. Range is about 5-125. There is moderate crepitus on range of motion. Tenderness, medial greater than lateral. She does have a varus valgus laxity and a slight bit of AP laxity.  Right knee with no effusion. Significant varus. Range is 5-125. There is marked crepitus on range of motion. She is tender, medial greater than lateral. She has a moderate laxity in the right knee, both AP  varus and valgus.  RADIOGRAPHS: Radiographs from early in the summer and she does have bone on bone arthritis medial compartment both knees with patellofemoral involvement. Also, worse on the right  side than the left.   Assessment & Plan (Romey Mathieson L. Lawsyn Heiler Stanley Stanley; 02/15/2015 3:45 PM) Primary osteoarthritis of both knees (M17.0) Note:Surgical Plans: Bilateral Total Knee Repalcements  Disposition: Rehab  PCP: Dr. Tommie Ard Baxley  IV TXA  Anesthesia Issues: NONE  Signed electronically by Joelene Millin, Stanley Stanley

## 2015-03-09 ENCOUNTER — Encounter (HOSPITAL_COMMUNITY): Payer: Self-pay | Admitting: *Deleted

## 2015-03-09 ENCOUNTER — Encounter (HOSPITAL_COMMUNITY)
Admission: RE | Disposition: A | Discharge status: Rehabilitation Facility | Payer: Self-pay | Source: Ambulatory Visit | Attending: Orthopedic Surgery

## 2015-03-09 ENCOUNTER — Inpatient Hospital Stay (HOSPITAL_COMMUNITY): Payer: 59 | Admitting: Anesthesiology

## 2015-03-09 ENCOUNTER — Inpatient Hospital Stay (HOSPITAL_COMMUNITY)
Admission: RE | Admit: 2015-03-09 | Discharge: 2015-03-14 | DRG: 462 | Disposition: A | Discharge status: Rehabilitation Facility | Payer: 59 | Source: Ambulatory Visit | Attending: Orthopedic Surgery | Admitting: Orthopedic Surgery

## 2015-03-09 DIAGNOSIS — M1710 Unilateral primary osteoarthritis, unspecified knee: Secondary | ICD-10-CM | POA: Diagnosis present

## 2015-03-09 DIAGNOSIS — I10 Essential (primary) hypertension: Secondary | ICD-10-CM | POA: Diagnosis present

## 2015-03-09 DIAGNOSIS — M170 Bilateral primary osteoarthritis of knee: Principal | ICD-10-CM | POA: Diagnosis present

## 2015-03-09 DIAGNOSIS — Z87440 Personal history of urinary (tract) infections: Secondary | ICD-10-CM | POA: Diagnosis not present

## 2015-03-09 DIAGNOSIS — Z01812 Encounter for preprocedural laboratory examination: Secondary | ICD-10-CM | POA: Diagnosis not present

## 2015-03-09 DIAGNOSIS — M179 Osteoarthritis of knee, unspecified: Secondary | ICD-10-CM | POA: Diagnosis present

## 2015-03-09 DIAGNOSIS — E039 Hypothyroidism, unspecified: Secondary | ICD-10-CM | POA: Diagnosis present

## 2015-03-09 DIAGNOSIS — Z79899 Other long term (current) drug therapy: Secondary | ICD-10-CM | POA: Diagnosis not present

## 2015-03-09 DIAGNOSIS — Z8262 Family history of osteoporosis: Secondary | ICD-10-CM | POA: Diagnosis not present

## 2015-03-09 DIAGNOSIS — Z85820 Personal history of malignant melanoma of skin: Secondary | ICD-10-CM | POA: Diagnosis not present

## 2015-03-09 DIAGNOSIS — M25569 Pain in unspecified knee: Secondary | ICD-10-CM | POA: Diagnosis present

## 2015-03-09 DIAGNOSIS — Z87891 Personal history of nicotine dependence: Secondary | ICD-10-CM

## 2015-03-09 HISTORY — PX: TOTAL KNEE ARTHROPLASTY: SHX125

## 2015-03-09 LAB — ABO/RH: ABO/RH(D): O NEG

## 2015-03-09 LAB — TYPE AND SCREEN
ABO/RH(D): O NEG
Antibody Screen: NEGATIVE

## 2015-03-09 SURGERY — ARTHROPLASTY, KNEE, TOTAL
Anesthesia: Spinal | Site: Knee | Laterality: Bilateral

## 2015-03-09 MED ORDER — PROPOFOL 10 MG/ML IV BOLUS
INTRAVENOUS | Status: AC
  Filled 2015-03-09: qty 20

## 2015-03-09 MED ORDER — SODIUM CHLORIDE 0.9 % IJ SOLN
INTRAMUSCULAR | Status: AC
  Filled 2015-03-09: qty 50

## 2015-03-09 MED ORDER — BUPIVACAINE HCL (PF) 0.5 % IJ SOLN
INTRAMUSCULAR | Status: AC
  Filled 2015-03-09: qty 30

## 2015-03-09 MED ORDER — METOCLOPRAMIDE HCL 10 MG PO TABS: 5.0000 mg | ORAL_TABLET | Freq: Three times a day (TID) | ORAL | Status: DC | PRN

## 2015-03-09 MED ORDER — WARFARIN SODIUM 4 MG PO TABS
4.0000 mg | ORAL_TABLET | Freq: Once | ORAL | Status: AC
  Administered 2015-03-10: 4 mg via ORAL
  Filled 2015-03-09: qty 1

## 2015-03-09 MED ORDER — WARFARIN VIDEO: Freq: Once | Status: DC

## 2015-03-09 MED ORDER — WARFARIN - PHARMACIST DOSING INPATIENT: Freq: Every day | Status: DC

## 2015-03-09 MED ORDER — METHOCARBAMOL 500 MG PO TABS
500.0000 mg | ORAL_TABLET | Freq: Four times a day (QID) | ORAL | Status: DC | PRN
  Administered 2015-03-10 – 2015-03-13 (×7): 500 mg via ORAL
  Filled 2015-03-09 (×7): qty 1

## 2015-03-09 MED ORDER — DIPHENHYDRAMINE HCL 12.5 MG/5ML PO ELIX
12.5000 mg | ORAL_SOLUTION | ORAL | Status: DC | PRN
  Administered 2015-03-10: 12.5 mg via ORAL
  Filled 2015-03-09: qty 10

## 2015-03-09 MED ORDER — MENTHOL 3 MG MT LOZG: 1.0000 | LOZENGE | OROMUCOSAL | Status: DC | PRN

## 2015-03-09 MED ORDER — LOSARTAN POTASSIUM 50 MG PO TABS
100.0000 mg | ORAL_TABLET | Freq: Every day | ORAL | Status: DC
  Administered 2015-03-11 – 2015-03-13 (×2): 100 mg via ORAL
  Filled 2015-03-09 (×5): qty 2

## 2015-03-09 MED ORDER — COUMADIN BOOK
Freq: Once | Status: AC
  Administered 2015-03-10: 09:00:00
  Filled 2015-03-09: qty 1

## 2015-03-09 MED ORDER — DEXAMETHASONE SODIUM PHOSPHATE 10 MG/ML IJ SOLN
10.0000 mg | Freq: Once | INTRAMUSCULAR | Status: AC
  Administered 2015-03-09: 10 mg via INTRAVENOUS

## 2015-03-09 MED ORDER — ONDANSETRON HCL 4 MG/2ML IJ SOLN
INTRAMUSCULAR | Status: DC | PRN
  Administered 2015-03-09: 4 mg via INTRAVENOUS

## 2015-03-09 MED ORDER — FENTANYL CITRATE 0.05 MG/ML IJ SOLN
INTRAMUSCULAR | Status: AC
  Filled 2015-03-09: qty 2

## 2015-03-09 MED ORDER — BISACODYL 10 MG RE SUPP
10.0000 mg | Freq: Every day | RECTAL | Status: DC | PRN
  Administered 2015-03-11: 10 mg via RECTAL
  Filled 2015-03-09: qty 1

## 2015-03-09 MED ORDER — PHENOL 1.4 % MT LIQD
1.0000 | OROMUCOSAL | Status: DC | PRN
  Filled 2015-03-09: qty 177

## 2015-03-09 MED ORDER — MIDAZOLAM HCL 2 MG/2ML IJ SOLN
INTRAMUSCULAR | Status: AC
  Filled 2015-03-09: qty 2

## 2015-03-09 MED ORDER — FLEET ENEMA 7-19 GM/118ML RE ENEM: 1.0000 | ENEMA | Freq: Once | RECTAL | Status: AC | PRN

## 2015-03-09 MED ORDER — DEXAMETHASONE SODIUM PHOSPHATE 10 MG/ML IJ SOLN
INTRAMUSCULAR | Status: AC
  Filled 2015-03-09: qty 1

## 2015-03-09 MED ORDER — METOCLOPRAMIDE HCL 5 MG/ML IJ SOLN: 5.0000 mg | Freq: Three times a day (TID) | INTRAMUSCULAR | Status: DC | PRN

## 2015-03-09 MED ORDER — METHOCARBAMOL 1000 MG/10ML IJ SOLN
500.0000 mg | Freq: Four times a day (QID) | INTRAVENOUS | Status: DC | PRN
  Administered 2015-03-10: 500 mg via INTRAVENOUS
  Filled 2015-03-09 (×2): qty 5

## 2015-03-09 MED ORDER — BUPIVACAINE HCL (PF) 0.5 % IJ SOLN
INTRAMUSCULAR | Status: DC | PRN
  Administered 2015-03-09: 3 mL

## 2015-03-09 MED ORDER — POLYETHYLENE GLYCOL 3350 17 G PO PACK: 17.0000 g | PACK | Freq: Every day | ORAL | Status: DC | PRN

## 2015-03-09 MED ORDER — MORPHINE SULFATE 2 MG/ML IJ SOLN
1.0000 mg | INTRAMUSCULAR | Status: DC | PRN
  Administered 2015-03-10 – 2015-03-11 (×7): 2 mg via INTRAVENOUS
  Filled 2015-03-09 (×8): qty 1

## 2015-03-09 MED ORDER — MIDAZOLAM HCL 5 MG/5ML IJ SOLN
INTRAMUSCULAR | Status: DC | PRN
  Administered 2015-03-09: 1 mg via INTRAVENOUS
  Administered 2015-03-09: 2 mg via INTRAVENOUS
  Administered 2015-03-09: 1 mg via INTRAVENOUS

## 2015-03-09 MED ORDER — NITROFURANTOIN MACROCRYSTAL 50 MG PO CAPS
50.0000 mg | ORAL_CAPSULE | Freq: Every day | ORAL | Status: DC
  Administered 2015-03-09 – 2015-03-13 (×5): 50 mg via ORAL
  Filled 2015-03-09 (×7): qty 1

## 2015-03-09 MED ORDER — ONDANSETRON HCL 4 MG PO TABS: 4.0000 mg | ORAL_TABLET | Freq: Four times a day (QID) | ORAL | Status: DC | PRN

## 2015-03-09 MED ORDER — ACETAMINOPHEN 500 MG PO TABS
1000.0000 mg | ORAL_TABLET | Freq: Four times a day (QID) | ORAL | Status: AC
  Administered 2015-03-10: 1000 mg via ORAL
  Filled 2015-03-09 (×2): qty 2

## 2015-03-09 MED ORDER — HYDROMORPHONE HCL 1 MG/ML IJ SOLN: 0.2500 mg | INTRAMUSCULAR | Status: DC | PRN

## 2015-03-09 MED ORDER — LACTATED RINGERS IV SOLN
INTRAVENOUS | Status: DC
  Administered 2015-03-09: 12:00:00 via INTRAVENOUS
  Administered 2015-03-09: 1000 mL via INTRAVENOUS
  Administered 2015-03-09: 12:00:00 via INTRAVENOUS

## 2015-03-09 MED ORDER — ACETAMINOPHEN 10 MG/ML IV SOLN
1000.0000 mg | Freq: Once | INTRAVENOUS | Status: AC
  Administered 2015-03-09: 1000 mg via INTRAVENOUS
  Filled 2015-03-09: qty 100

## 2015-03-09 MED ORDER — VANCOMYCIN HCL IN DEXTROSE 1-5 GM/200ML-% IV SOLN
1000.0000 mg | Freq: Once | INTRAVENOUS | Status: AC
  Administered 2015-03-09: 1000 mg via INTRAVENOUS
  Filled 2015-03-09: qty 200

## 2015-03-09 MED ORDER — VANCOMYCIN HCL IN DEXTROSE 1-5 GM/200ML-% IV SOLN
1000.0000 mg | INTRAVENOUS | Status: AC
  Administered 2015-03-09: 1000 mg via INTRAVENOUS

## 2015-03-09 MED ORDER — DEXAMETHASONE SODIUM PHOSPHATE 10 MG/ML IJ SOLN
10.0000 mg | Freq: Once | INTRAMUSCULAR | Status: AC
  Administered 2015-03-10: 10 mg via INTRAVENOUS
  Filled 2015-03-09: qty 1

## 2015-03-09 MED ORDER — SODIUM CHLORIDE 0.9 % IV SOLN
INTRAVENOUS | Status: DC
  Administered 2015-03-09: 75 mL/h via INTRAVENOUS
  Administered 2015-03-11 (×2): via INTRAVENOUS

## 2015-03-09 MED ORDER — CHLORTHALIDONE 25 MG PO TABS
25.0000 mg | ORAL_TABLET | Freq: Every morning | ORAL | Status: DC
  Administered 2015-03-11 – 2015-03-13 (×3): 25 mg via ORAL
  Filled 2015-03-09 (×5): qty 1

## 2015-03-09 MED ORDER — OXYCODONE HCL 5 MG PO TABS
5.0000 mg | ORAL_TABLET | ORAL | Status: DC | PRN
  Administered 2015-03-10: 5 mg via ORAL
  Administered 2015-03-11 (×5): 15 mg via ORAL
  Administered 2015-03-12: 5 mg via ORAL
  Administered 2015-03-12 (×2): 15 mg via ORAL
  Administered 2015-03-12: 5 mg via ORAL
  Administered 2015-03-12: 15 mg via ORAL
  Administered 2015-03-13 – 2015-03-14 (×4): 5 mg via ORAL
  Filled 2015-03-09: qty 3
  Filled 2015-03-09: qty 1
  Filled 2015-03-09 (×3): qty 3
  Filled 2015-03-09 (×2): qty 1
  Filled 2015-03-09: qty 2
  Filled 2015-03-09: qty 1
  Filled 2015-03-09: qty 3
  Filled 2015-03-09: qty 1
  Filled 2015-03-09 (×4): qty 3
  Filled 2015-03-09 (×2): qty 1

## 2015-03-09 MED ORDER — TRAMADOL HCL 50 MG PO TABS
50.0000 mg | ORAL_TABLET | Freq: Four times a day (QID) | ORAL | Status: DC | PRN
  Administered 2015-03-11 – 2015-03-12 (×3): 100 mg via ORAL
  Administered 2015-03-13 – 2015-03-14 (×2): 50 mg via ORAL
  Filled 2015-03-09 (×2): qty 1
  Filled 2015-03-09 (×3): qty 2

## 2015-03-09 MED ORDER — FENTANYL CITRATE 0.05 MG/ML IJ SOLN
50.0000 ug | INTRAMUSCULAR | Status: DC | PRN
  Administered 2015-03-09: 100 ug via INTRAVENOUS

## 2015-03-09 MED ORDER — PROPOFOL INFUSION 10 MG/ML OPTIME
INTRAVENOUS | Status: DC | PRN
  Administered 2015-03-09: 75 ug/kg/min via INTRAVENOUS

## 2015-03-09 MED ORDER — PHENYLEPHRINE HCL 10 MG/ML IJ SOLN
INTRAMUSCULAR | Status: DC | PRN
  Administered 2015-03-09 (×3): 40 ug via INTRAVENOUS

## 2015-03-09 MED ORDER — ROPIVACAINE HCL 2 MG/ML IJ SOLN
14.0000 mL/h | INTRAMUSCULAR | Status: DC
  Administered 2015-03-09: 12 mL/h via EPIDURAL
  Administered 2015-03-10: 14 mL/h via EPIDURAL
  Administered 2015-03-10: 12 mL/h via EPIDURAL
  Filled 2015-03-09 (×9): qty 200

## 2015-03-09 MED ORDER — SODIUM CHLORIDE 0.9 % IV SOLN: INTRAVENOUS | Status: DC

## 2015-03-09 MED ORDER — CHLORHEXIDINE GLUCONATE 4 % EX LIQD: 60.0000 mL | Freq: Once | CUTANEOUS | Status: DC

## 2015-03-09 MED ORDER — VANCOMYCIN HCL IN DEXTROSE 1-5 GM/200ML-% IV SOLN
INTRAVENOUS | Status: AC
  Filled 2015-03-09: qty 200

## 2015-03-09 MED ORDER — ONDANSETRON HCL 4 MG/2ML IJ SOLN
4.0000 mg | Freq: Four times a day (QID) | INTRAMUSCULAR | Status: DC | PRN
  Administered 2015-03-11: 4 mg via INTRAVENOUS
  Filled 2015-03-09: qty 2

## 2015-03-09 MED ORDER — PHENYLEPHRINE 40 MCG/ML (10ML) SYRINGE FOR IV PUSH (FOR BLOOD PRESSURE SUPPORT)
PREFILLED_SYRINGE | INTRAVENOUS | Status: AC
  Filled 2015-03-09: qty 10

## 2015-03-09 MED ORDER — ACETAMINOPHEN 650 MG RE SUPP: 650.0000 mg | Freq: Four times a day (QID) | RECTAL | Status: DC | PRN

## 2015-03-09 MED ORDER — DOCUSATE SODIUM 100 MG PO CAPS
100.0000 mg | ORAL_CAPSULE | Freq: Two times a day (BID) | ORAL | Status: DC
  Administered 2015-03-09 – 2015-03-14 (×9): 100 mg via ORAL

## 2015-03-09 MED ORDER — ONDANSETRON HCL 4 MG/2ML IJ SOLN
INTRAMUSCULAR | Status: AC
  Filled 2015-03-09: qty 2

## 2015-03-09 MED ORDER — ACETAMINOPHEN 325 MG PO TABS
650.0000 mg | ORAL_TABLET | Freq: Four times a day (QID) | ORAL | Status: DC | PRN
  Administered 2015-03-10: 650 mg via ORAL
  Filled 2015-03-09: qty 2

## 2015-03-09 MED ORDER — TRANEXAMIC ACID 100 MG/ML IV SOLN
1000.0000 mg | INTRAVENOUS | Status: AC
  Administered 2015-03-09: 1000 mg via INTRAVENOUS
  Filled 2015-03-09: qty 10

## 2015-03-09 MED ORDER — PROPOFOL 10 MG/ML IV BOLUS
INTRAVENOUS | Status: DC | PRN
  Administered 2015-03-09 (×4): 30 mg via INTRAVENOUS

## 2015-03-09 MED ORDER — BUPIVACAINE HCL (PF) 0.25 % IJ SOLN
INTRAMUSCULAR | Status: AC
  Filled 2015-03-09: qty 30

## 2015-03-09 SURGICAL SUPPLY — 65 items
BAG DECANTER FOR FLEXI CONT (MISCELLANEOUS) ×1 IMPLANT
BAG SPEC THK2 15X12 ZIP CLS (MISCELLANEOUS) ×1
BAG ZIPLOCK 12X15 (MISCELLANEOUS) ×2 IMPLANT
BANDAGE ELASTIC 6 VELCRO ST LF (GAUZE/BANDAGES/DRESSINGS) ×2 IMPLANT
BANDAGE ESMARK 6X9 LF (GAUZE/BANDAGES/DRESSINGS) ×1 IMPLANT
BLADE SAG 18X100X1.27 (BLADE) ×3 IMPLANT
BLADE SAW SGTL 11.0X1.19X90.0M (BLADE) ×3 IMPLANT
BNDG CMPR 9X6 STRL LF SNTH (GAUZE/BANDAGES/DRESSINGS) ×2
BNDG ESMARK 6X9 LF (GAUZE/BANDAGES/DRESSINGS) ×4
BOWL SMART MIX CTS (DISPOSABLE) ×3 IMPLANT
CAPT KNEE TOTAL 3 ATTUNE ×2 IMPLANT
CEMENT HV SMART SET (Cement) ×6 IMPLANT
CUFF TOURN SGL QUICK 34 (TOURNIQUET CUFF) ×4
CUFF TRNQT CYL 34X4X40X1 (TOURNIQUET CUFF) ×1 IMPLANT
DECANTER SPIKE VIAL GLASS SM (MISCELLANEOUS) ×1 IMPLANT
DRAPE EXTREMITY T 121X128X90 (DRAPE) ×2 IMPLANT
DRAPE LG THREE QUARTER DISP (DRAPES) ×2 IMPLANT
DRAPE POUCH INSTRU U-SHP 10X18 (DRAPES) ×2 IMPLANT
DRAPE U-SHAPE 47X51 STRL (DRAPES) ×3 IMPLANT
DRSG ADAPTIC 3X8 NADH LF (GAUZE/BANDAGES/DRESSINGS) ×2 IMPLANT
DRSG PAD ABDOMINAL 8X10 ST (GAUZE/BANDAGES/DRESSINGS) ×2 IMPLANT
DURAPREP 26ML APPLICATOR (WOUND CARE) ×3 IMPLANT
ELECT REM PT RETURN 9FT ADLT (ELECTROSURGICAL) ×4
ELECTRODE REM PT RTRN 9FT ADLT (ELECTROSURGICAL) ×1 IMPLANT
EVACUATOR 1/8 PVC DRAIN (DRAIN) ×3 IMPLANT
FACESHIELD WRAPAROUND (MASK) ×8 IMPLANT
FACESHIELD WRAPAROUND OR TEAM (MASK) ×5 IMPLANT
GAUZE SPONGE 4X4 12PLY STRL (GAUZE/BANDAGES/DRESSINGS) ×2 IMPLANT
GAUZE SPONGE 4X4 16PLY XRAY LF (GAUZE/BANDAGES/DRESSINGS) ×1 IMPLANT
GLOVE BIO SURGEON STRL SZ7.5 (GLOVE) ×1 IMPLANT
GLOVE BIO SURGEON STRL SZ8 (GLOVE) ×2 IMPLANT
GLOVE BIOGEL PI IND STRL 6.5 (GLOVE) IMPLANT
GLOVE BIOGEL PI IND STRL 8 (GLOVE) ×1 IMPLANT
GLOVE BIOGEL PI INDICATOR 6.5 (GLOVE)
GLOVE BIOGEL PI INDICATOR 8 (GLOVE) ×1
GLOVE SURG SS PI 6.5 STRL IVOR (GLOVE) IMPLANT
GOWN STRL REUS W/TWL LRG LVL3 (GOWN DISPOSABLE) ×2 IMPLANT
GOWN STRL REUS W/TWL XL LVL3 (GOWN DISPOSABLE) ×1 IMPLANT
HANDPIECE INTERPULSE COAX TIP (DISPOSABLE) ×2
IMMOBILIZER KNEE 20 (SOFTGOODS) ×2
IMMOBILIZER KNEE 20 THIGH 36 (SOFTGOODS) ×1 IMPLANT
KIT BASIN OR (CUSTOM PROCEDURE TRAY) ×2 IMPLANT
MANIFOLD NEPTUNE II (INSTRUMENTS) ×2 IMPLANT
NDL SAFETY ECLIPSE 18X1.5 (NEEDLE) ×2 IMPLANT
NEEDLE HYPO 18GX1.5 SHARP (NEEDLE) ×4
NS IRRIG 1000ML POUR BTL (IV SOLUTION) ×2 IMPLANT
PACK TOTAL JOINT (CUSTOM PROCEDURE TRAY) ×2 IMPLANT
PADDING CAST COTTON 6X4 STRL (CAST SUPPLIES) ×4 IMPLANT
PEN SKIN MARKING BROAD (MISCELLANEOUS) ×2 IMPLANT
POSITIONER SURGICAL ARM (MISCELLANEOUS) ×2 IMPLANT
SET HNDPC FAN SPRY TIP SCT (DISPOSABLE) ×1 IMPLANT
STRIP CLOSURE SKIN 1/2X4 (GAUZE/BANDAGES/DRESSINGS) ×3 IMPLANT
SUCTION FRAZIER 12FR DISP (SUCTIONS) ×2 IMPLANT
SUT MNCRL AB 4-0 PS2 18 (SUTURE) ×3 IMPLANT
SUT VIC AB 2-0 CT1 27 (SUTURE) ×6
SUT VIC AB 2-0 CT1 TAPERPNT 27 (SUTURE) ×3 IMPLANT
SUT VLOC 180 0 24IN GS25 (SUTURE) ×3 IMPLANT
SYR 20CC LL (SYRINGE) ×2 IMPLANT
SYR 50ML LL SCALE MARK (SYRINGE) ×2 IMPLANT
TOWEL OR 17X26 10 PK STRL BLUE (TOWEL DISPOSABLE) ×2 IMPLANT
TOWEL OR NON WOVEN STRL DISP B (DISPOSABLE) ×1 IMPLANT
TRAY FOLEY CATH 14FRSI W/METER (CATHETERS) ×2 IMPLANT
WATER STERILE IRR 1500ML POUR (IV SOLUTION) ×2 IMPLANT
WRAP KNEE MAXI GEL POST OP (GAUZE/BANDAGES/DRESSINGS) ×3 IMPLANT
YANKAUER SUCT BULB TIP 10FT TU (MISCELLANEOUS) ×2 IMPLANT

## 2015-03-09 NOTE — Progress Notes (Signed)
No pain currently. No complaints. No back pain.  BP 136/68, HR 66, RR 16, SpO2 100% on oxygen 2L/min [place]  Ropivacaine 0.2 % at 12 cc/hour  Able to move left toes.  T-10 level bilaterally to ice.  Continue current therapy.

## 2015-03-09 NOTE — Op Note (Signed)
Pre-operative diagnosis- Osteoarthritis  Bilateral knee(s)  Post-operative diagnosis- Osteoarthritis Bilateral knee(s)  Procedure-  Bilateral  Total Knee Arthroplasty (Attune system)  Surgeon- Dione Plover. Ever Gustafson, MD  Assistant- Arlee Muslim, PA-C   Anesthesia-  Epidural  EBL-* No blood loss amount entered *   Drains Hemovac x 1 each side  Tourniquet time- Right- 29 minutes @ 300 mm HG    Left 27 minutes @ 850 mm Hg    Complications- None  Condition-PACU - hemodynamically stable.   Brief Clinical Note  Tara Stanley is a 59 y.o. year old female with end stage OA of both knees with progressively worsening pain and dysfunction. He has constant pain, with activity and at rest and significant functional deficits with difficulties even with ADLs. She has had extensive non-op management including analgesics, injections of cortisone and viscosupplements, and home exercise program, but remains in significant pain with significant dysfunction. We discussed replacing both knees in the same setting versus one at a time including procedure, risks, potential complications, rehab course, and pros and cons associated with each and the patient elects to do both knees at the same time. She presents now for Bilateral Total Knee Arthroplasty.     Procedure in detail---   The patient is brought into the operating room and positioned supine on the operating table. After successful administration of  Epidural,   a tourniquet is placed high on the  Bilateral thigh(s) and the lower extremities are prepped and draped in the usual sterile fashion. Time out is performed by the operating team and then the  Right lower extremity is wrapped in Esmarch, knee flexed and the tourniquet inflated to 300 mmHg.       A midline incision is made with a ten blade through the subcutaneous tissue to the level of the extensor mechanism. A fresh blade is used to make a medial parapatellar arthrotomy. Soft tissue over the proximal  medial tibia is subperiosteally elevated to the joint line with a knife and into the semimembranosus bursa with a Cobb elevator. Soft tissue over the proximal lateral tibia is elevated with attention being paid to avoiding the patellar tendon on the tibial tubercle. The patella is everted, knee flexed 90 degrees and the ACL and PCL are removed. Findings are bone on bone medial and patellofemoral with large medial osteophytes        The drill is used to create a starting hole in the distal femur and the canal is thoroughly irrigated with sterile saline to remove the fatty contents. The 5 degree Right  valgus alignment guide is placed into the femoral canal and the distal femoral cutting block is pinned to remove 9 mm off the distal femur. Resection is made with an oscillating saw.      The tibia is subluxed forward and the menisci are removed. The extramedullary alignment guide is placed referencing proximally at the medial aspect of the tibial tubercle and distally along the second metatarsal axis and tibial crest. The block is pinned to remove 54mm off the more deficient medial  side. Resection is made with an oscillating saw. Size 6is the most appropriate size for the tibia and the proximal tibia is prepared with the modular drill and keel punch for that size.      The femoral sizing guide is placed and size 6 is most appropriate. Rotation is marked off the epicondylar axis and confirmed by creating a rectangular flexion gap at 90 degrees. The size 6 cutting block is pinned in  this rotation and the anterior, posterior and chamfer cuts are made with the oscillating saw. The intercondylar block is then placed and that cut is made.      Trial size 6 tibial component, trial size 6 posterior stabilized femur and a 8  mm posterior stabilized rotating platform insert trial is placed. Full extension is achieved with excellent varus/valgus and anterior/posterior balance throughout full range of motion. The patella is  everted and thickness measured to be 22  mm. Free hand resection is taken to 12 mm, a 35 template is placed, lug holes are drilled, trial patella is placed, and it tracks normally. Osteophytes are removed off the posterior femur with the trial in place. All trials are removed and the cut bone surfaces prepared with pulsatile lavage. Cement is mixed and once ready for implantation, the size 6 tibial implant, size  6 narrow posterior stabilized femoral component, and the size 35 patella are cemented in place and the patella is held with the clamp. The trial insert is placed and the knee held in full extension.  All extruded cement is removed and once the cement is hard the permanent 8 mm posterior stabilized rotating platform insert is placed into the tibial tray.      The wound is copiously irrigated with saline solution and the extensor mechanism closed over a hemovac drain with #1 V-loc suture. The tourniquet is released for a total tourniquet time of 29  minutes. Flexion against gravity is 140 degrees and the patella tracks normally. Subcutaneous tissue is closed with 2.0 vicryl and subcuticular with running 4.0 Monocryl.       The  Left lower extremity is wrapped in Esmarch, knee flexed and the tourniquet inflated to 300 mmHg.       A midline incision is made with a ten blade through the subcutaneous tissue to the level of the extensor mechanism. A fresh blade is used to make a medial parapatellar arthrotomy. Soft tissue over the proximal medial tibia is subperiosteally elevated to the joint line with a knife and into the semimembranosus bursa with a Cobb elevator. Soft tissue over the proximal lateral tibia is elevated with attention being paid to avoiding the patellar tendon on the tibial tubercle. The patella is everted, knee flexed 90 degrees and the ACL and PCL are removed. Findings are bone on bone medial and patellofemoral with large medial osteophyts        The drill is used to create a starting hole  in the distal femur and the canal is thoroughly irrigated with sterile saline to remove the fatty contents. The 5 degree Left  valgus alignment guide is placed into the femoral canal and the distal femoral cutting block is pinned to remove 9 mm off the distal femur. Resection is made with an oscillating saw.      The tibia is subluxed forward and the menisci are removed. The extramedullary alignment guide is placed referencing proximally at the medial aspect of the tibial tubercle and distally along the second metatarsal axis and tibial crest. The block is pinned to remove 22mm off the more deficient medial  side. Resection is made with an oscillating saw. Size 6is the most appropriate size for the tibia and the proximal tibia is prepared with the modular drill and keel punch for that size.      The femoral sizing guide is placed and size 6 is most appropriate. Rotation is marked off the epicondylar axis and confirmed by creating a rectangular flexion gap  at 90 degrees. The size 6 cutting block is pinned in this rotation and the anterior, posterior and chamfer cuts are made with the oscillating saw. The intercondylar block is then placed and that cut is made.      Trial size 6 tibial component, trial size 6 posterior stabilized femur and a 8  mm posterior stabilized rotating platform insert trial is placed. Full extension is achieved with excellent varus/valgus and anterior/posterior balance throughout full range of motion. The patella is everted and thickness measured to be 22  mm. Free hand resection is taken to 12 mm, a 35 template is placed, lug holes are drilled, trial patella is placed, and it tracks normally. Osteophytes are removed off the posterior femur with the trial in place. All trials are removed and the cut bone surfaces prepared with pulsatile lavage. Cement is mixed and once ready for implantation, the size 6 tibial implant, size  6 narrow posterior stabilized femoral component, and the size 35  patella are cemented in place and the patella is held with the clamp. The trial insert is placed and the knee held in full extension.   All extruded cement is removed and once the cement is hard the permanent 8 mm posterior stabilized rotating platform insert is placed into the tibial tray.      The wound is copiously irrigated with saline solution and the extensor mechanism closed over a hemovac drain with #1 V-loc suture. The tourniquet is released for a total tourniquet time of 27  minutes. Flexion against gravity is 140 degrees and the patella tracks normally. Subcutaneous tissue is closed with 2.0 vicryl and subcuticular with running 4.0 Monocryl. The incisions are cleaned and dried and steri-strips and  bulky sterile dressings are applied. The limbs are placed into knee immobilizers and the patient is awakened and transported to recovery in stable condition.            Please note that a surgical assistant was a medical necessity for this procedure in order to perform it in a safe and expeditious manner. Surgical assistant was necessary to retract the ligaments and vital neurovascular structures to prevent injury to them and also necessary for proper positioning of the limb to allow for anatomic placement of the prosthesis.   Dione Plover Ciel Yanes, MD    03/09/2015, 1:07 PM

## 2015-03-09 NOTE — Anesthesia Preprocedure Evaluation (Addendum)
Anesthesia Evaluation  Patient identified by MRN, date of birth, ID band Patient awake    Reviewed: Allergy & Precautions, NPO status , Patient's Chart, lab work & pertinent test results  Airway Mallampati: II  TM Distance: >3 FB Neck ROM: Full    Dental no notable dental hx.    Pulmonary former smoker,  breath sounds clear to auscultation  Pulmonary exam normal       Cardiovascular Exercise Tolerance: Good hypertension, Pt. on medications Rhythm:Regular Rate:Normal     Neuro/Psych H/O lumbar spinal stenosis.  Had HNP many years ago. Has been symptom free since doing yoga and strengthening her core. negative psych ROS   GI/Hepatic negative GI ROS, Neg liver ROS,   Endo/Other  Hypothyroidism   Renal/GU Renal disease  negative genitourinary   Musculoskeletal  (+) Arthritis -, Osteoarthritis,    Abdominal   Peds negative pediatric ROS (+)  Hematology negative hematology ROS (+)   Anesthesia Other Findings   Reproductive/Obstetrics negative OB ROS                            Anesthesia Physical Anesthesia Plan  ASA: II  Anesthesia Plan: Combined Spinal and Epidural   Post-op Pain Management:    Induction: Intravenous  Airway Management Planned:   Additional Equipment:   Intra-op Plan:   Post-operative Plan: Extubation in OR  Informed Consent: I have reviewed the patients History and Physical, chart, labs and discussed the procedure including the risks, benefits and alternatives for the proposed anesthesia with the patient or authorized representative who has indicated his/her understanding and acceptance.   Dental advisory given  Plan Discussed with: CRNA  Anesthesia Plan Comments: (Discussed spinal/epidural and general. Patient consents. Discussed risks/benefits of spinal and epidural  including headache, backache, failure, bleeding, infection, and nerve damage. Patient  consents to spinal and epidural. Questions answered. Coagulation studies and platelet count acceptable.)      Anesthesia Quick Evaluation

## 2015-03-09 NOTE — Anesthesia Postprocedure Evaluation (Signed)
  Anesthesia Post-op Note  Patient: Tara Stanley  Procedure(s) Performed: Procedure(s) (LRB): BILATERAL TOTAL KNEE ARTHROPLASTY (Bilateral)  Patient Location: PACU  Anesthesia Type: Spinal and epidural  Level of Consciousness: awake and alert   Airway and Oxygen Therapy: Patient Spontanous Breathing  Post-op Pain: mild  Post-op Assessment: Post-op Vital signs reviewed, Patient's Cardiovascular Status Stable, Respiratory Function Stable, Patent Airway and No signs of Nausea or vomiting  Last Vitals:  Filed Vitals:   03/09/15 1733  BP: 126/65  Pulse: 65  Temp: 36.9 C  Resp: 15    Post-op Vital Signs: stable   Complications: No apparent anesthesia complications. Spinal level has fallen three levels from T-10 to L-1 while in PACU. No pain. To Floor on Ropivacaine epidural.

## 2015-03-09 NOTE — Transfer of Care (Signed)
Immediate Anesthesia Transfer of Care Note  Patient: Tara Stanley  Procedure(s) Performed: Procedure(s): BILATERAL TOTAL KNEE ARTHROPLASTY (Bilateral)  Patient Location: PACU  Anesthesia Type:Regional  Level of Consciousness: awake, alert  and oriented  Airway & Oxygen Therapy: Patient Spontanous Breathing and Patient connected to face mask oxygen  Post-op Assessment: Report given to RN and Post -op Vital signs reviewed and stable  Post vital signs: Reviewed and stable  Last Vitals:  Filed Vitals:   03/09/15 0836  BP: 143/90  Pulse: 84  Temp: 37.3 C  Resp: 16    Complications: No apparent anesthesia complications

## 2015-03-09 NOTE — H&P (View-Only) (Signed)
Tara Stanley DOB: [birth year] Married / Language: English / Race: White Female Date of Admission:  03/09/2015 CC: Bilateral Knee Pain History of Present Illness (Rian Koon L. Dwayne Begay III PA-C; 02/27/2015 5:44 PM) The patient is a 59 year old female who comes in for a preoperative History and Physical. The patient is scheduled for a bilateral total knee arthroplasty to be performed by Dr. Dione Plover. Aluisio, MD at [facility] on 03-09-2015. The patient is a 59 year old female who presents for follow up of their knee. The patient is being followed for their bilateral knee pain and osteoarthritis. They are now several months out from Euflexxa series. Symptoms reported today include: pain and aching. The patient feels that they are doing poorly. Current treatment includes: activity modification. The patient has not gotten any relief of their symptoms with viscosupplementation. The knees have gotten progressively worse. She states she can not do anything she desires with respect to activity level. She said she can not wear heels at all. She can not go walking any more. She can not play tennis. She is very frustrated with her knees. She is not a great candidate for unicompartmental replacement as she has a significant laxity in both knees. Also, she has significant patellofemoral degeneration. At this point, the most predictable means of improving pain and function will be a total knee arthroplasty. She is not thrilled to hear this but realizes that this is the only thing that will predictably get her better. She is a great candidate to do both at the same time. We discussed a knee replacement in detail, procedure, risks and rehab course. We discussed the pros and cons of doing both at the same time versus one at a time. She is definitely young and healthy enough to do both at the same time. She would prefer to do it that way. She elects to proceed with surgery for both knees at this time. They have been  treated conservatively in the past for the above stated problem and despite conservative measures, they continue to have progressive pain and severe functional limitations and dysfunction. They have failed non-operative management including home exercise, medications, and injections. It is felt that they would benefit from undergoing total joint replacements. Risks and benefits of the procedure have been discussed with the patient and they elect to proceed with surgery. There are no active contraindications to surgery such as ongoing infection or rapidly progressive neurological disease.  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) Hypothyroidism Osteoarthritis Skin Cancer High blood pressure Hypothyroidism Osteoarthritis Skin Cancer High blood pressure  Allergies No Known Drug Allergies  Family History (Osmin Welz L Johnetta Sloniker, III PA-C; 02/15/2015 3:47 PM) Cerebrovascular Accident father, grandfather mothers side and grandmother fathers side Chronic Obstructive Lung Disease mother Cancer grandfather mothers side Hypertension mother and father Osteoarthritis mother, sister and grandfather mothers side Osteoporosis grandmother mothers side  Social History (Maalik Pinn Monika Salk, III PA-C; 02/15/2015 3:47 PM) Drug/Alcohol Rehab (Currently) no Exercise Exercises daily; does running / walking, individual sport, gym / weights and team sport Alcohol use Occasional alcohol use. current drinker; drinks wine; 15 or more per week Tobacco use Former smoker. former smoker; smoke(d) 3 or more pack(s) per day Illicit drug use no Number of flights of stairs before winded greater than 5 Pain Contract no Living situation live with spouse Children 2 Previously in rehab no Most recent primary occupation Nurse, adult, serve on  local boards Marital status  married Tobacco / smoke exposure no  Medication History (Deriyah Kunath L Jefferson Fullam, III PA-C; 02/15/2015 3:49 PM) Meloxicam (15MG  Tablet, 1 (one) Tablet Oral daily, Taken starting 09/30/2014) Active. Biotin (Oral) Specific dose unknown - Active. Losartan Potassium (100MG  Tablet, Oral) Active. Chlorthalidone (25MG  Tablet, Oral) Active. Centrum Silver Ultra Womens (Oral) Active. Black Cohosh Root (Oral) Specific dose unknown - Active. Cranberry Extract (Oral) Specific dose unknown - Active. Estring (2MG  Ring, Vaginal) Active.  Past Surgical History (Laray Corbit L Tianni Escamilla, III PA-C; 02/15/2015 3:47 PM) Appendectomy Arthroscopy of Knee right   Review of Systems (Paiton Fosco L. Breunna Nordmann III PA-C; 02/15/2015 4:29 PM) General Not Present- Chills, Fatigue, Fever, Memory Loss, Night Sweats, Weight Gain and Weight Loss. Skin Not Present- Eczema, Hives, Itching, Lesions and Rash. HEENT Not Present- Dentures, Double Vision, Headache, Hearing Loss, Tinnitus and Visual Loss. Respiratory Not Present- Allergies, Chronic Cough, Coughing up blood, Shortness of breath at rest and Shortness of breath with exertion. Cardiovascular Not Present- Chest Pain, Difficulty Breathing Lying Down, Murmur, Palpitations, Racing/skipping heartbeats and Swelling. Gastrointestinal Not Present- Abdominal Pain, Bloody Stool, Constipation, Diarrhea, Difficulty Swallowing, Heartburn, Jaundice, Loss of appetitie, Nausea and Vomiting. Female Genitourinary Not Present- Blood in Urine, Discharge, Flank Pain, Incontinence, Painful Urination, Urgency, Urinary frequency, Urinary Retention, Urinating at Night and Weak urinary stream. Musculoskeletal Present- Joint Pain. Not Present- Back Pain, Joint Swelling, Morning Stiffness, Muscle Pain, Muscle Weakness and Spasms. Neurological Not Present- Blackout spells, Difficulty with balance, Dizziness, Paralysis, Tremor and Weakness. Psychiatric Not Present- Insomnia.   Vitals  (Rania Prothero L. Rejeana Fadness III PA-C; 02/15/2015 4:25 PM) 02/15/2015 3:32 PM Weight: 150.13 lb Height: 67in Body Surface Area: 1.79 m Body Mass Index: 23.51 kg/m  Pulse: 81 (Regular)  BP: 141/86 (Sitting, Left Arm, Standard)  Physical Exam (Rael Yo L. Truth Barot III PA-C; 02/15/2015 3:45 PM) General Mental Status -Alert, cooperative and good historian. General Appearance-pleasant, Not in acute distress. Orientation-Oriented X3. Build & Nutrition-Well nourished and Well developed.  Head and Neck Head-normocephalic, atraumatic . Neck Global Assessment - supple, no bruit auscultated on the right, no bruit auscultated on the left.  Eye Pupil - Bilateral-Regular and Round. Motion - Bilateral-EOMI.  Chest and Lung Exam Auscultation Breath sounds - clear at anterior chest wall and clear at posterior chest wall. Adventitious sounds - No Adventitious sounds.  Cardiovascular Auscultation Rhythm - Regular rate and rhythm. Heart Sounds - S1 WNL and S2 WNL. Murmurs & Other Heart Sounds - Auscultation of the heart reveals - No Murmurs.  Abdomen Palpation/Percussion Tenderness - Abdomen is non-tender to palpation. Rigidity (guarding) - Abdomen is soft. Auscultation Auscultation of the abdomen reveals - Bowel sounds normal.  Female Genitourinary Note: Not done, not pertinent to present illness   Musculoskeletal Note: She is a well developed female. She is alert and oriented, in no apparent distress. Both hips show a normal range of motion with no discomfort. The left knee shows no effusion. Range is about 5-125. There is moderate crepitus on range of motion. Tenderness, medial greater than lateral. She does have a varus valgus laxity and a slight bit of AP laxity.  Right knee with no effusion. Significant varus. Range is 5-125. There is marked crepitus on range of motion. She is tender, medial greater than lateral. She has a moderate laxity in the right knee, both AP  varus and valgus.  RADIOGRAPHS: Radiographs from early in the summer and she does have bone on bone arthritis medial compartment both knees with patellofemoral involvement. Also, worse on the right  side than the left.   Assessment & Plan (Charma Mocarski L. Daymian Lill III PA-C; 02/15/2015 3:45 PM) Primary osteoarthritis of both knees (M17.0) Note:Surgical Plans: Bilateral Total Knee Repalcements  Disposition: Rehab  PCP: Dr. Tommie Ard Baxley  IV TXA  Anesthesia Issues: NONE  Signed electronically by Joelene Millin, III PA-C

## 2015-03-09 NOTE — Anesthesia Procedure Notes (Addendum)
Epidural Patient location during procedure: holding area Start time: 03/09/2015 11:00 AM End time: 03/09/2015 11:14 AM  Staffing Anesthesiologist: Franne Grip Performed by: anesthesiologist   Preanesthetic Checklist Completed: patient identified, site marked, surgical consent, pre-op evaluation, timeout performed, IV checked, risks and benefits discussed, monitors and equipment checked and post-op pain management  Epidural Patient position: sitting Prep: Betadine Patient monitoring: heart rate, continuous pulse ox and blood pressure Approach: midline Location: L3-L4 Injection technique: LOR saline  Needle:  Needle type: Hustead  Needle gauge: 18 G Needle length: 9 cm and 9 Needle insertion depth: 4 and 4.2 cm Catheter type: closed end flexible Catheter size: 20 Guage Catheter at skin depth: 8 cm Test dose: negative and 1.5% lidocaine  Additional Notes Test dose 1.5% Lidocaine with epi 1:200,000  Patient tolerated the insertion well without complications. No paresthesia. No heme  Meaningful verbal contact maintained throughout epidural/spinal placement.Reason for block:post-op pain management  Spinal Patient location during procedure: OR Staffing Anesthesiologist: Franne Grip Performed by: anesthesiologist  Preanesthetic Checklist Completed: patient identified, site marked, surgical consent, pre-op evaluation, timeout performed, IV checked, risks and benefits discussed and monitors and equipment checked Spinal Block Patient position: sitting Prep: Betadine Patient monitoring: heart rate, continuous pulse ox and blood pressure Approach: midline Location: L3-4 Injection technique: single-shot Needle Needle type: Pencan  Needle gauge: 25 G Needle length: 9 cm Additional Notes Expiration date of kit checked and confirmed. Patient tolerated procedure well, without complications. No paresthesia. Spinal through epidural needle. (kit used)

## 2015-03-09 NOTE — Interval H&P Note (Signed)
History and Physical Interval Note:  03/09/2015 10:44 AM  Tara Stanley  has presented today for surgery, with the diagnosis of OA OF BILATERAL KNEE  The various methods of treatment have been discussed with the patient and family. After consideration of risks, benefits and other options for treatment, the patient has consented to  Procedure(s): BILATERAL TOTAL KNEE ARTHROPLASTY (Bilateral) as a surgical intervention .  The patient's history has been reviewed, patient examined, no change in status, stable for surgery.  I have reviewed the patient's chart and labs.  Questions were answered to the patient's satisfaction.     Gearlean Alf

## 2015-03-09 NOTE — Progress Notes (Signed)
ANTICOAGULATION CONSULT NOTE - Initial Consult  Pharmacy Consult for warfarin Indication: VTE prophylaxis while on epidural analgesia  Allergies  Allergen Reactions  . Hydrocodone Other (See Comments)    Makes me goofy    Patient Measurements: Height: 5\' 7"  (170.2 cm) Weight: 148 lb (67.132 kg) IBW/kg (Calculated) : 61.6  Vital Signs: Temp: 98.3 F (36.8 C) (03/09 2125) Temp Source: Oral (03/09 2125) BP: 107/75 mmHg (03/09 2125) Pulse Rate: 81 (03/09 2125)  Labs: No results for input(s): HGB, HCT, PLT, APTT, LABPROT, INR, HEPARINUNFRC, CREATININE, CKTOTAL, CKMB, TROPONINI in the last 72 hours.  Estimated Creatinine Clearance: 72.7 mL/min (by C-G formula based on Cr of 0.53).   Medical History: Past Medical History  Diagnosis Date  . Hypertension   . Thyroid disease      nodule, hypothyroidism pt was on synthyroid but is not having to take currently pt states her MD states she does not have issues with   . Spinal stenosis     mild hx of herniated disc  . Arthritis     osteoarthritis  . Cancer     melanoma- right upper arm  . Osteomyelitis     left leg  . Renal cyst     benign, found on CT scan, is being followed  . History of frequent urinary tract infections     on prophalaytic medication     Medications:  Prescriptions prior to admission  Medication Sig Dispense Refill Last Dose  . Biotin 1 MG CAPS Take 1 tablet by mouth every morning.   Past Week at Unknown time  . BLACK COHOSH PO Take 300 mg by mouth every morning.    Past Week at Unknown time  . chlorthalidone (HYGROTON) 25 MG tablet Take 25 mg by mouth every morning.    03/08/2015 at 0700  . CRANBERRY PO Take 465 mg by mouth every morning.   Past Week at Unknown time  . estradiol (ESTRING) 2 MG vaginal ring Place 2 mg vaginally every 3 (three) months. 1 each 3 03/05/2015 at out  . losartan (COZAAR) 100 MG tablet TAKE 1 TABLET DAILY 90 tablet 3 03/09/2015 at 0700  . meloxicam (MOBIC) 7.5 MG tablet Take 15 mg by  mouth every morning.   5 03/08/2015 at 0700  . Multiple Vitamin (MULTIVITAMIN) tablet Take 1 tablet by mouth every morning.    Past Week at Unknown time  . nitrofurantoin (MACRODANTIN) 50 MG capsule Take 50 mg by mouth at bedtime.   03/07/2015 at 2000  . cephALEXin (KEFLEX) 500 MG capsule Take 1 capsule (500 mg total) by mouth 4 (four) times daily. Take all of medicine and drink lots of fluids (Patient not taking: Reported on 02/18/2015) 20 capsule 0 Taking   Scheduled:  . acetaminophen  1,000 mg Oral 4 times per day  . [START ON 03/10/2015] chlorthalidone  25 mg Oral q morning - 10a  . [START ON 03/10/2015] dexamethasone  10 mg Intravenous Once  . docusate sodium  100 mg Oral BID  . [START ON 03/10/2015] losartan  100 mg Oral Daily  . nitrofurantoin  50 mg Oral QHS  . vancomycin  1,000 mg Intravenous Once   Infusions:  . sodium chloride 75 mL/hr (03/09/15 1637)  . ropivacaine (PF) 2 mg/ml (0.2%) 12 mL/hr (03/09/15 1347)   PRN: [START ON 03/10/2015] acetaminophen **OR** [START ON 03/10/2015] acetaminophen, bisacodyl, diphenhydrAMINE, menthol-cetylpyridinium **OR** phenol, methocarbamol **OR** methocarbamol (ROBAXIN)  IV, metoCLOPramide **OR** metoCLOPramide (REGLAN) injection, morphine injection, ondansetron **OR** ondansetron (ZOFRAN) IV,  oxyCODONE, polyethylene glycol, sodium phosphate, traMADol  Assessment: 58yoF s/p bilateral TKA on ropivacaine epidural; pharmacy consulted to dose warfarin for VTE prophylaxis   CBC wnl (from 2/29)  No bleeding issues noted per nursing  No interacting meds  Baseline INR pending  Goal of Therapy:  INR 2-3   Plan:   Warfarin 4 mg PO x 1 tomorrow at 10:00 - no further doses of warfarin to be given until epidural removed.  Daily INR, CBC q72 hr while on warfarin  Monitor for signs of bleeding  Reuel Boom, PharmD Pager: [phone] 03/09/2015, 10:10 PM

## 2015-03-09 NOTE — Progress Notes (Signed)
Utilization review completed.  

## 2015-03-09 NOTE — Progress Notes (Signed)
Dr. Delma Post in to dose epiural.

## 2015-03-09 NOTE — Progress Notes (Signed)
No pain currently. No complaints. No back pain.  BP 107/75, HR 81, RR 12, SpO2 99% on 2 L/min [place]  Moving both legs on command. Level about L-1  Plan: Level has fallen. Increase epidural rate to 14 mL/hour. Order written. Notified nurse.

## 2015-03-10 ENCOUNTER — Encounter (HOSPITAL_COMMUNITY): Payer: Self-pay | Admitting: Orthopedic Surgery

## 2015-03-10 DIAGNOSIS — Z96653 Presence of artificial knee joint, bilateral: Secondary | ICD-10-CM

## 2015-03-10 DIAGNOSIS — M170 Bilateral primary osteoarthritis of knee: Principal | ICD-10-CM

## 2015-03-10 LAB — PROTIME-INR
INR: 0.96 (ref 0.00–1.49)
PROTHROMBIN TIME: 12.9 s (ref 11.6–15.2)

## 2015-03-10 LAB — BASIC METABOLIC PANEL
Anion gap: 8 (ref 5–15)
BUN: 11 mg/dL (ref 6–23)
CALCIUM: 8.5 mg/dL (ref 8.4–10.5)
CHLORIDE: 105 mmol/L (ref 96–112)
CO2: 25 mmol/L (ref 19–32)
CREATININE: 0.52 mg/dL (ref 0.50–1.10)
GFR calc Af Amer: >90 mL/min (ref >90)
GFR calc non Af Amer: >90 mL/min (ref >90)
Glucose, Bld: 144 mg/dL — ABNORMAL HIGH (ref 70–99)
POTASSIUM: 3.4 mmol/L — AB (ref 3.5–5.1)
Sodium: 138 mmol/L (ref 135–145)

## 2015-03-10 LAB — CBC
HEMATOCRIT: 30.5 % — AB (ref 36.0–46.0)
HEMOGLOBIN: 10.5 g/dL — AB (ref 12.0–15.0)
MCH: 31.9 pg (ref 26.0–34.0)
MCHC: 34.4 g/dL (ref 30.0–36.0)
MCV: 92.7 fL (ref 78.0–100.0)
PLATELETS: 215 10*3/uL (ref 150–400)
RBC: 3.29 MIL/uL — ABNORMAL LOW (ref 3.87–5.11)
RDW: 12.2 % (ref 11.5–15.5)
WBC: 8.4 10*3/uL (ref 4.0–10.5)

## 2015-03-10 NOTE — Care Management Note (Signed)
    Page 1 of 1   03/10/2015     2:04:09 PM CARE MANAGEMENT NOTE 03/10/2015  Patient:  Tara Stanley, Tara Stanley   Account Number:  [id number]  Date Initiated:  03/10/2015  Documentation initiated by:  [facility]  Subjective/Objective Assessment:   adm: BILATERAL TOTAL KNEE ARTHROPLASTY (Bilateral)     Action/Plan:   CIR   Anticipated DC Date:  03/11/2015   Anticipated DC Plan:  IP REHAB FACILITY         Choice offered to / List presented to:             Status of service:  Completed, signed off Medicare Important Message given?   (If response is "NO", the following Medicare IM given date fields will be blank) Date Medicare IM given:   Medicare IM given by:   Date Additional Medicare IM given:   Additional Medicare IM given by:    Discharge Disposition:  IP REHAB FACILITY  Per UR Regulation:    If discussed at Long Length of Stay Meetings, dates discussed:    Comments:  03/10/15 14:00 CM notes pt to go to CIR; PA has placed CIR consult.  No other CM needs were communicated. Tempie Hoist, BSN, [place].

## 2015-03-10 NOTE — Progress Notes (Signed)
Pt is hoping to have ST Rehab at [facility] following hospital d/c.Marland Kitchen CSW is available to assist with d/c planning if these plans change.Werner Lean LCSW [phone]

## 2015-03-10 NOTE — Progress Notes (Signed)
Physical Therapy Treatment Patient Details Name: Ciella Obi MRN: [id number] DOB: March 30, 1956 Today's Date: 03/10/2015    History of Present Illness B TKR    PT Comments    Pt very motivated but continues ltd by decreased sensation L LE 2* epidural.  Follow Up Recommendations  CIR     Equipment Recommendations  None recommended by PT    Recommendations for Other Services OT consult     Precautions / Restrictions Precautions Precautions: Knee;Fall Required Braces or Orthoses: Knee Immobilizer - Right;Knee Immobilizer - Left Knee Immobilizer - Right: Discontinue once straight leg raise with < 10 degree lag Knee Immobilizer - Left: Discontinue once straight leg raise with < 10 degree lag Restrictions Weight Bearing Restrictions: No Other Position/Activity Restrictions: WBAT    Mobility  Bed Mobility Overal bed mobility: Needs Assistance;+2 for physical assistance;+ 2 for safety/equipment Bed Mobility: Sit to Supine       Sit to supine: Mod assist;+2 for safety/equipment;+2 for physical assistance   General bed mobility comments: cues for sequence  with assist to manage bil LEs and to bring trunk to upright  Transfers Overall transfer level: Needs assistance Equipment used: Rolling walker (2 wheeled) Transfers: Sit to/from Stand Sit to Stand: Mod assist;Max assist;+2 physical assistance;+2 safety/equipment Stand pivot transfers: Mod assist;Max assist;+2 physical assistance;+2 safety/equipment       General transfer comment: cues for use of UEs to self assist and for LE management; Physical assist for support, balance, RW management and to advance L LE  Ambulation/Gait             General Gait Details: stand pvt with RW only 2* ltd sensation L LE   Stairs            Wheelchair Mobility    Modified Rankin (Stroke Patients Only)       Balance                                    Cognition Arousal/Alertness: Awake/alert Behavior  During Therapy: WFL for tasks assessed/performed Overall Cognitive Status: Within Functional Limits for tasks assessed                      Exercises      General Comments        Pertinent Vitals/Pain Pain Assessment: No/denies pain    Home Living                      Prior Function            PT Goals (current goals can now be found in the care plan section) Acute Rehab PT Goals Patient Stated Goal: rehab and home to resume previous lifestyle with decreased pain PT Goal Formulation: With patient Time For Goal Achievement: 03/17/15 Potential to Achieve Goals: Good Progress towards PT goals: Progressing toward goals    Frequency  7X/week    PT Plan Current plan remains appropriate    Co-evaluation             End of Session Equipment Utilized During Treatment: Gait belt;Right knee immobilizer;Left knee immobilizer Activity Tolerance: Patient tolerated treatment well;Patient limited by fatigue Patient left: in bed;with call bell/phone within reach     Time: 1275-1700 PT Time Calculation (min) (ACUTE ONLY): 30 min  Charges:  $Therapeutic Activity: 23-37 mins  G Codes:      Nobuo Nunziata Mar 22, 2015, 3:15 PM

## 2015-03-10 NOTE — Evaluation (Signed)
Physical Therapy Evaluation Patient Details Name: Tara Stanley MRN: [id number] DOB: September 11, 1956 Today's Date: 03/10/2015   History of Present Illness  B TKR  Clinical Impression  Pt s/p Bil TKR presents with decreased Bil LE strength/ROM and post op pain limiting functional mobility.  Pt would benefit from follow up rehab at CIR level to maximize IND and safety prior to return home.    Follow Up Recommendations CIR    Equipment Recommendations  None recommended by PT    Recommendations for Other Services OT consult     Precautions / Restrictions Precautions Precautions: Knee;Fall Required Braces or Orthoses: Knee Immobilizer - Right;Knee Immobilizer - Left Knee Immobilizer - Right: Discontinue once straight leg raise with < 10 degree lag Knee Immobilizer - Left: Discontinue once straight leg raise with < 10 degree lag Restrictions Weight Bearing Restrictions: No Other Position/Activity Restrictions: WBAT      Mobility  Bed Mobility Overal bed mobility: Needs Assistance;+2 for physical assistance;+ 2 for safety/equipment Bed Mobility: Supine to Sit     Supine to sit: Mod assist;+2 for physical assistance;+2 for safety/equipment     General bed mobility comments: cues for sequence  with assist to manage bil LEs and to bring trunk to upright  Transfers Overall transfer level: Needs assistance Equipment used: Rolling walker (2 wheeled) Transfers: Sit to/from Stand Sit to Stand: Mod assist;+2 physical assistance;+2 safety/equipment;From elevated surface Stand pivot transfers: Mod assist;+2 physical assistance;+2 safety/equipment       General transfer comment: cues for use of UEs to self assist and for LE management  Ambulation/Gait             General Gait Details: stand pvt with RW only 2* ltd sensation L LE  Stairs            Wheelchair Mobility    Modified Rankin (Stroke Patients Only)       Balance                                              Pertinent Vitals/Pain Pain Assessment: No/denies pain    Home Living Family/patient expects to be discharged to:: Inpatient rehab Living Arrangements: Spouse/significant other                    Prior Function Level of Independence: Independent               Hand Dominance        Extremity/Trunk Assessment   Upper Extremity Assessment: Overall WFL for tasks assessed           Lower Extremity Assessment: RLE deficits/detail;LLE deficits/detail RLE Deficits / Details: 2+/5 quads with AAROM at knee -10 - 90 LLE Deficits / Details: 2-/5 quads with AAROM at knee -10 - 90 - pt with min LE control 2* effect of epidural  Cervical / Trunk Assessment: Normal  Communication   Communication: No difficulties  Cognition Arousal/Alertness: Awake/alert Behavior During Therapy: WFL for tasks assessed/performed Overall Cognitive Status: Within Functional Limits for tasks assessed                      General Comments      Exercises Total Joint Exercises Ankle Circles/Pumps: AROM;Both;15 reps;Supine Quad Sets: AROM;Both;10 reps;Supine Heel Slides: AAROM;Both;10 reps;Supine Straight Leg Raises: AAROM;Both;10 reps;Supine      Assessment/Plan    PT Assessment Patient needs  continued PT services  PT Diagnosis Difficulty walking   PT Problem List Decreased strength;Decreased range of motion;Decreased activity tolerance;Decreased mobility;Decreased knowledge of use of DME;Pain  PT Treatment Interventions DME instruction;Gait training;Stair training;Functional mobility training;Therapeutic activities;Therapeutic exercise;Patient/family education   PT Goals (Current goals can be found in the Care Plan section) Acute Rehab PT Goals Patient Stated Goal: rehab and home to resume previous lifestyle with decreased pain PT Goal Formulation: With patient Time For Goal Achievement: 03/17/15 Potential to Achieve Goals: Good    Frequency 7X/week    Barriers to discharge        Co-evaluation               End of Session Equipment Utilized During Treatment: Gait belt;Right knee immobilizer;Left knee immobilizer Activity Tolerance: Patient tolerated treatment well Patient left: in chair;with call bell/phone within reach Nurse Communication: Mobility status         Time: 0950-1030 PT Time Calculation (min) (ACUTE ONLY): 40 min   Charges:   PT Evaluation $Initial PT Evaluation Tier I: 1 Procedure PT Treatments $Therapeutic Exercise: 8-22 mins $Therapeutic Activity: 8-22 mins   PT G Codes:        An Schnabel 06-Apr-2015, 12:30 PM

## 2015-03-10 NOTE — Consult Note (Signed)
Physical Medicine and Rehabilitation Consult Reason for Consult: Bilateral total knee arthroplasty secondary to end-stage osteoarthritis Referring Physician: Dr.Alusio   HPI: Tara Stanley is a 58 y.o. right handed female with history of hypertension. Patient independent prior to admission living with her husband. Presented 03/08/2015 with end-stage osteoarthritis bilateral knees. No change with conservative care including activity modification. Underwent bilateral total knee arthroplasty 03/09/2015 per Dr.Alusio. Hospital course pain management. Coumadin for DVT prophylaxis. Weightbearing as tolerated. Physical and occupational therapy evaluations are pending. M.D. has requested physical medicine rehabilitation consult.  Pt states she has been "pre habbing " in gym for several months prior to surgery Left LE more numb than right with epidural Plans to have epidural removed in am Tolerating po meds and meals , has foley Review of Systems  Gastrointestinal: Positive for constipation.  Musculoskeletal: Positive for myalgias and joint pain.  All other systems reviewed and are negative.  Past Medical History  Diagnosis Date  . Hypertension   . Thyroid disease      nodule, hypothyroidism pt was on synthyroid but is not having to take currently pt states her MD states she does not have issues with   . Spinal stenosis     mild hx of herniated disc  . Arthritis     osteoarthritis  . Cancer     melanoma- right upper arm  . Osteomyelitis     left leg  . Renal cyst     benign, found on CT scan, is being followed  . History of frequent urinary tract infections     on prophalaytic medication    Past Surgical History  Procedure Laterality Date  . Appendectomy  03/2011    laparoscopic  . Repair of rt med meniscal tear    . Skin cancer excision      right upper arm  . Left tibia      for osteomelitis of left tibia at age 17  . Colonscopy      Family History  Problem Relation  Age of Onset  . Colon cancer Neg Hx   . Esophageal cancer Neg Hx   . Stomach cancer Neg Hx   . Rectal cancer Neg Hx   . Thyroid disease Neg Hx   . Diabetes Mother     borderline type II  . Hypertension Mother   . Arthritis Mother   . Hypertension Father   . Stroke Father   . Hypertension Other     all grandparents  . Stroke Maternal Grandmother   . Osteoporosis Maternal Grandmother   . Stroke Paternal Grandfather   . Aneurysm Maternal Aunt 63   Social History:  reports that she quit smoking about 21 years ago. Her smoking use included Cigarettes. She has a 40 pack-year smoking history. She has never used smokeless tobacco. She reports that she drinks about 1.2 oz of alcohol per week. She reports that she uses illicit drugs. Allergies:  Allergies  Allergen Reactions  . Hydrocodone Other (See Comments)    Makes me goofy   Medications Prior to Admission  Medication Sig Dispense Refill  . Biotin 1 MG CAPS Take 1 tablet by mouth every morning.    Marland Kitchen BLACK COHOSH PO Take 300 mg by mouth every morning.     . chlorthalidone (HYGROTON) 25 MG tablet Take 25 mg by mouth every morning.     Marland Kitchen CRANBERRY PO Take 465 mg by mouth every morning.    Marland Kitchen estradiol (ESTRING) 2 MG vaginal  ring Place 2 mg vaginally every 3 (three) months. 1 each 3  . losartan (COZAAR) 100 MG tablet TAKE 1 TABLET DAILY 90 tablet 3  . meloxicam (MOBIC) 7.5 MG tablet Take 15 mg by mouth every morning.   5  . Multiple Vitamin (MULTIVITAMIN) tablet Take 1 tablet by mouth every morning.     . nitrofurantoin (MACRODANTIN) 50 MG capsule Take 50 mg by mouth at bedtime.    . cephALEXin (KEFLEX) 500 MG capsule Take 1 capsule (500 mg total) by mouth 4 (four) times daily. Take all of medicine and drink lots of fluids (Patient not taking: Reported on 02/18/2015) 20 capsule 0    Home: Home Living Family/patient expects to be discharged to:: Private residence Living Arrangements: Spouse/significant other Available Help at  Discharge: Family Type of Home: House Home Layout: Two level, Able to live on main level with bedroom/bathroom Home Equipment: None  Functional History:   Functional Status:  Mobility:          ADL:    Cognition: Cognition Orientation Level: Oriented X4    Blood pressure 119/75, pulse 72, temperature 98.2 F (36.8 C), temperature source Oral, resp. rate 12, height 5\' 7"  (1.702 m), weight 67.132 kg (148 lb), last menstrual period 01/01/2008, SpO2 100 %. Physical Exam  Constitutional: She is oriented to person, place, and time. She appears well-developed.  HENT:  Head: Normocephalic.  Eyes: EOM are normal.  Neck: Normal range of motion. Neck supple. No thyromegaly present.  Cardiovascular: Normal rate and regular rhythm.   Respiratory: Effort normal and breath sounds normal. No respiratory distress.  GI: Soft. Bowel sounds are normal. She exhibits no distension.  Neurological: She is alert and oriented to person, place, and time.  Skin:  Bilateral knee incisions are dressed appropriately tender  Motor:  5/5 in Bilateral delt, bi, tri, grip Right 3-/5 HF, KE, 4/5 , ankle is 4/5 Left 2- HF, 3- KE 3- ADF Sensation parasthesias with LT in bilateral feet and up to Left thigh  Results for orders placed or performed during the hospital encounter of 03/09/15 (from the past 24 hour(s))  ABO/Rh     Status: None   Collection Time: 03/09/15  9:00 AM  Result Value Ref Range   ABO/RH(D) O NEG   Type and screen     Status: None   Collection Time: 03/09/15  9:10 AM  Result Value Ref Range   ABO/RH(D) O NEG    Antibody Screen NEG    Sample Expiration 03/12/2015    No results found.  Assessment/Plan: Diagnosis: Bilateral end stage osteoarthritis of the knees POD #1 post B TKR 1. Does the need for close, 24 hr/day medical supervision in concert with the patient's rehab needs make it unreasonable for this patient to be served in a less intensive setting? Yes 2. Co-Morbidities  requiring supervision/potential complications: HTN,lumbar spinal stenosis 3. Due to bladder management, bowel management, safety, skin/wound care, disease management, medication administration, pain management and patient education, does the patient require 24 hr/day rehab nursing? Yes 4. Does the patient require coordinated care of a physician, rehab nurse, PT (1-2 hrs/day, 5 days/week) and OT (1-2 hrs/day, 5 days/week) to address physical and functional deficits in the context of the above medical diagnosis(es)? Yes Addressing deficits in the following areas: balance, endurance, locomotion, strength, transferring, bowel/bladder control, bathing, dressing, feeding, grooming and toileting 5. Can the patient actively participate in an intensive therapy program of at least 3 hrs of therapy per day at least 5  days per week? Yes and after Epidural out 6. The potential for patient to make measurable gains while on inpatient rehab is excellent 7. Anticipated functional outcomes upon discharge from inpatient rehab are modified independent  with PT, modified independent with OT, n/a with SLP. 8. Estimated rehab length of stay to reach the above functional goals is: 7d 9. Does the patient have adequate social supports and living environment to accommodate these discharge functional goals? Yes 10. Anticipated D/C setting: Home 11. Anticipated post D/C treatments: [place] therapy 12. Overall Rehab/Functional Prognosis: excellent  RECOMMENDATIONS: This patient's condition is appropriate for continued rehabilitative care in the following setting: CIR if still requiring at least Min A after epidural is out POD#2 Patient has agreed to participate in recommended program. Yes Note that insurance prior authorization may be required for reimbursement for recommended care.  Comment:     03/10/2015

## 2015-03-10 NOTE — Progress Notes (Signed)
ANTICOAGULATION CONSULT NOTE - Follow Up Consult  Pharmacy Consult for warfarin Indication: VTE prophylaxis while on epidural anesthesia  Allergies  Allergen Reactions  . Hydrocodone Other (See Comments)    Makes me goofy    Patient Measurements: Height: 5\' 7"  (170.2 cm) Weight: 148 lb (67.132 kg) IBW/kg (Calculated) : 61.6   Vital Signs: Temp: 98.4 F (36.9 C) (03/10 0946) Temp Source: Oral (03/10 0946) BP: 128/74 mmHg (03/10 0946) Pulse Rate: 85 (03/10 0946)  Labs:  Recent Labs  03/10/15 0530  HGB 10.5*  HCT 30.5*  PLT 215  LABPROT 12.9  INR 0.96  CREATININE 0.52    Estimated Creatinine Clearance: 72.7 mL/min (by C-G formula based on Cr of 0.52).   Medications:  Prescriptions prior to admission  Medication Sig Dispense Refill Last Dose  . Biotin 1 MG CAPS Take 1 tablet by mouth every morning.   Past Week at Unknown time  . BLACK COHOSH PO Take 300 mg by mouth every morning.    Past Week at Unknown time  . chlorthalidone (HYGROTON) 25 MG tablet Take 25 mg by mouth every morning.    03/08/2015 at 0700  . CRANBERRY PO Take 465 mg by mouth every morning.   Past Week at Unknown time  . estradiol (ESTRING) 2 MG vaginal ring Place 2 mg vaginally every 3 (three) months. 1 each 3 03/05/2015 at out  . losartan (COZAAR) 100 MG tablet TAKE 1 TABLET DAILY 90 tablet 3 03/09/2015 at 0700  . meloxicam (MOBIC) 7.5 MG tablet Take 15 mg by mouth every morning.   5 03/08/2015 at 0700  . Multiple Vitamin (MULTIVITAMIN) tablet Take 1 tablet by mouth every morning.    Past Week at Unknown time  . nitrofurantoin (MACRODANTIN) 50 MG capsule Take 50 mg by mouth at bedtime.   03/07/2015 at 2000  . cephALEXin (KEFLEX) 500 MG capsule Take 1 capsule (500 mg total) by mouth 4 (four) times daily. Take all of medicine and drink lots of fluids (Patient not taking: Reported on 02/18/2015) 20 capsule 0 Taking   Scheduled:  . acetaminophen  1,000 mg Oral 4 times per day  . chlorthalidone  25 mg Oral q  morning - 10a  . docusate sodium  100 mg Oral BID  . losartan  100 mg Oral Daily  . nitrofurantoin  50 mg Oral QHS  . warfarin   Does not apply Once  . Warfarin - Pharmacist Dosing Inpatient   Does not apply Daily   Infusions:  . sodium chloride 50 mL/hr (03/10/15 0816)  . ropivacaine (PF) 2 mg/ml (0.2%) 12 mL/hr (03/10/15 0147)   PRN: acetaminophen **OR** acetaminophen, bisacodyl, diphenhydrAMINE, menthol-cetylpyridinium **OR** phenol, methocarbamol **OR** methocarbamol (ROBAXIN)  IV, metoCLOPramide **OR** metoCLOPramide (REGLAN) injection, morphine injection, ondansetron **OR** ondansetron (ZOFRAN) IV, oxyCODONE, polyethylene glycol, traMADol  Assessment: 58yoF s/p bilateral TKA on ropivacaine epidural; pharmacy consulted to dose warfarin for VTE prophylaxis  Today, 03/10/2015:  Received 4 mg warfarin this AM; epidural catheter still in place with plans to remove tomorrow AM.  CBC with Hgb slightly low c/w ABLA, Plt wnl  No bleeding issues noted per nursing  No major medication interactions  INR today (baseline) ~1.0 as expected  Eating 100% of meals  Goal of Therapy:  INR 2-3  Plan:   No further warfarin today  Re-assess patient tomorrow; if epidural removed, may give warfarin after a MINIMUM of 4 hours post-catheter removal  INR, CBC q72 hr while on warfarin  Monitor for signs of bleeding  Reuel Boom, PharmD Pager: [phone] 03/10/2015, 1:41 PM

## 2015-03-10 NOTE — Progress Notes (Signed)
POD#1 s/p Bil TKR  Pt sitting in bed. Quite comfortable with epidural. No complaints. Left side more numb than left, but satisfied with pain control.   Site clean, dry, intact. AFVSS  Plan: Continue epidural at 14cc/hr, plain 0.2%ropiv. Coumadin tonight.  D/c epidural in AM tomorrow. Lovenox tomorrow night.  [employer]

## 2015-03-10 NOTE — Progress Notes (Addendum)
Subjective: 1 Day Post-Op Procedure(s) (LRB): BILATERAL TOTAL KNEE ARTHROPLASTY (Bilateral) Patient reports pain as mild.   Patient seen in rounds by Dr. Wynelle Link.  Epidural in place.  Anesthesia following for epidural management. Patient is well, but has had some minor complaints of pain in the knees, requiring pain medications We will start therapy today.  Plan is to go Rehab after hospital stay.  Objective: Vital signs in last 24 hours: Temp:  [97.6 F (36.4 C)-99.2 F (37.3 C)] 98.2 F (36.8 C) (03/10 0446) Pulse Rate:  [53-84] 72 (03/10 0446) Resp:  [9-16] 12 (03/10 0446) BP: (101-143)/(59-90) 119/75 mmHg (03/10 0446) SpO2:  [97 %-100 %] 100 % (03/10 0446) FiO2 (%):  [2 %] 2 % (03/09 1630) Weight:  [67.132 kg (148 lb)-67.359 kg (148 lb 8 oz)] 67.132 kg (148 lb) (03/09 1430)  Intake/Output from previous day:  Intake/Output Summary (Last 24 hours) at 03/10/15 0724 Last data filed at 03/10/15 0622  Gross per 24 hour  Intake   5440 ml  Output   3968 ml  Net   1472 ml    Intake/Output this shift:    Labs:  Recent Labs  03/10/15 0530  HGB 10.5*    Recent Labs  03/10/15 0530  WBC 8.4  RBC 3.29*  HCT 30.5*  PLT 215    Recent Labs  03/10/15 0530  NA 138  K 3.4*  CL 105  CO2 25  BUN 11  CREATININE 0.52  GLUCOSE 144*  CALCIUM 8.5    Recent Labs  03/10/15 0530  INR 0.96    EXAM General - Patient is Alert, Appropriate and Oriented Extremity - Neurovascular intact Sensation intact distally Dorsiflexion/Plantar flexion intact Dressing - dressing C/D/I Motor Function - intact, moving feet and toes well on exam.  Both Hemovacs pulled without difficulty.  Past Medical History  Diagnosis Date  . Hypertension   . Thyroid disease      nodule, hypothyroidism pt was on synthyroid but is not having to take currently pt states her MD states she does not have issues with   . Spinal stenosis     mild hx of herniated disc  . Arthritis    osteoarthritis  . Cancer     melanoma- right upper arm  . Osteomyelitis     left leg  . Renal cyst     benign, found on CT scan, is being followed  . History of frequent urinary tract infections     on prophalaytic medication     Assessment/Plan: 1 Day Post-Op Procedure(s) (LRB): BILATERAL TOTAL KNEE ARTHROPLASTY (Bilateral) Principal Problem:   OA (osteoarthritis) of knee  Estimated body mass index is 23.17 kg/(m^2) as calculated from the following:   Height as of this encounter: 5\' 7"  (1.702 m).   Weight as of this encounter: 67.132 kg (148 lb). Advance diet Up with therapy Continue foley for now.  Will keep foley until tomorrow and will not be removed until at least 6-8 hours following the removal of the epidural catheter.  DVT Prophylaxis - Lovenox and Coumadin, Lovenox will not start until tomorrow afternoon following removal of the epidural. First dose of Coumadin will start this evening. Weight-Bearing as tolerated to both leg  Continue O2 and Pulse OX   Take Coumadin for four weeks and then discontinue.  The dose may need to be adjusted based upon the INR.  Please follow the INR and titrate Coumadin dose for a therapeutic range between 2.0 and 3.0 INR.  After completing  the four weeks of Coumadin, the patient may stop the Coumadin and resume their 81 mg Aspirin daily.  Lovenox injections will start tomorrow evening after the epidural has been removed and continue until the INR is therapeutic at or greater than 2.0.  When INR reaches the therapeutic level of equal to or greater than 2.0, the patient may discontinue the Lovenox injections.  Arlee Muslim, PA-C Orthopaedic Surgery 03/10/2015, 7:24 AM

## 2015-03-10 NOTE — Progress Notes (Signed)
Spoke with patient regarding her PCA/End Tidal pump.  She was concerned because it continued to alarm last night every time she dozed off to sleep.  RT look at the pump and all alarms are set with in limits and her trends do reflect come shallow breathing as well as an apnea here and there.  Explained how the respiratory system works with anesthesia and how it effects the system.  She understood the concepts and was assured by the RT that these limits are in place to keep her safe as she recovers.  She was also assured by the RT that this should be better tonight as her pain dissipates and the medicine is weaning out of her system.  She had a great understanding of the concepts we discussed and was happy with the explanation.

## 2015-03-11 LAB — BASIC METABOLIC PANEL
Anion gap: 8 (ref 5–15)
BUN: 12 mg/dL (ref 6–23)
CHLORIDE: 107 mmol/L (ref 96–112)
CO2: 26 mmol/L (ref 19–32)
Calcium: 8.6 mg/dL (ref 8.4–10.5)
Creatinine, Ser: 0.48 mg/dL — ABNORMAL LOW (ref 0.50–1.10)
GFR calc Af Amer: >90 mL/min (ref >90)
Glucose, Bld: 94 mg/dL (ref 70–99)
Potassium: 3.4 mmol/L — ABNORMAL LOW (ref 3.5–5.1)
Sodium: 141 mmol/L (ref 135–145)

## 2015-03-11 LAB — CBC
HCT: 26.7 % — ABNORMAL LOW (ref 36.0–46.0)
Hemoglobin: 9.1 g/dL — ABNORMAL LOW (ref 12.0–15.0)
MCH: 32 pg (ref 26.0–34.0)
MCHC: 34.1 g/dL (ref 30.0–36.0)
MCV: 94 fL (ref 78.0–100.0)
Platelets: 185 10*3/uL (ref 150–400)
RBC: 2.84 MIL/uL — ABNORMAL LOW (ref 3.87–5.11)
RDW: 12.4 % (ref 11.5–15.5)
WBC: 7.8 10*3/uL (ref 4.0–10.5)

## 2015-03-11 LAB — PROTIME-INR
INR: 0.94 (ref 0.00–1.49)
PROTHROMBIN TIME: 12.6 s (ref 11.6–15.2)

## 2015-03-11 MED ORDER — WARFARIN SODIUM 5 MG PO TABS
5.0000 mg | ORAL_TABLET | Freq: Once | ORAL | Status: AC
  Administered 2015-03-11: 5 mg via ORAL
  Filled 2015-03-11: qty 1

## 2015-03-11 MED ORDER — ENOXAPARIN SODIUM 30 MG/0.3ML ~~LOC~~ SOLN
30.0000 mg | Freq: Two times a day (BID) | SUBCUTANEOUS | Status: DC
  Administered 2015-03-11 – 2015-03-14 (×6): 30 mg via SUBCUTANEOUS
  Filled 2015-03-11 (×9): qty 0.3

## 2015-03-11 NOTE — Progress Notes (Signed)
Physical Therapy Treatment Patient Details Name: Tara Stanley MRN: [id number] DOB: 1956/02/07 Today's Date: 03/11/2015    History of Present Illness B TKR    PT Comments    Pt continues motivated but moving slowly this date - ltd by pain and fatigue this pm.  Follow Up Recommendations  CIR     Equipment Recommendations  None recommended by PT    Recommendations for Other Services OT consult     Precautions / Restrictions Precautions Precautions: Knee;Fall Required Braces or Orthoses: Knee Immobilizer - Right;Knee Immobilizer - Left Knee Immobilizer - Right: Discontinue once straight leg raise with < 10 degree lag Knee Immobilizer - Left: Discontinue once straight leg raise with < 10 degree lag Restrictions Weight Bearing Restrictions: No Other Position/Activity Restrictions: WBAT    Mobility  Bed Mobility Overal bed mobility: Needs Assistance;+2 for physical assistance;+ 2 for safety/equipment Bed Mobility: Sit to Supine     Supine to sit: Mod assist;+2 for physical assistance;+2 for safety/equipment Sit to supine: Mod assist;+2 for safety/equipment;+2 for physical assistance   General bed mobility comments: cues for sequence  with assist to manage bil LEs and to bring trunk to upright  Transfers Overall transfer level: Needs assistance Equipment used: Rolling walker (2 wheeled) Transfers: Sit to/from Stand Sit to Stand: Mod assist;+2 physical assistance;+2 safety/equipment Stand pivot transfers: Mod assist;+2 physical assistance;+2 safety/equipment       General transfer comment: cues for use of UEs to self assist and for LE management; Physical assist for support, balance, RW management  Ambulation/Gait Ambulation/Gait assistance: Mod assist;+2 physical assistance;+2 safety/equipment Ambulation Distance (Feet): 4 Feet Assistive device: Rolling walker (2 wheeled) Gait Pattern/deviations: Step-to pattern;Decreased step length - right;Decreased step length -  left;Shuffle;Trunk flexed Gait velocity: decr   General Gait Details: cues for posture, position from RW and sequence   Stairs            Wheelchair Mobility    Modified Rankin (Stroke Patients Only)       Balance                                    Cognition Arousal/Alertness: Awake/alert Behavior During Therapy: WFL for tasks assessed/performed Overall Cognitive Status: Within Functional Limits for tasks assessed                      Exercises      General Comments        Pertinent Vitals/Pain Pain Assessment: 0-10 Pain Score: 7  Pain Location: Bil knees Pain Descriptors / Indicators: Aching;Sore Pain Intervention(s): Limited activity within patient's tolerance;Monitored during session;Premedicated before session;Ice applied    Home Living Family/patient expects to be discharged to:: Inpatient rehab Living Arrangements: Spouse/significant other Available Help at Discharge: Family         Home Equipment: None      Prior Function Level of Independence: Independent          PT Goals (current goals can now be found in the care plan section) Acute Rehab PT Goals Patient Stated Goal: rehab and home to resume previous lifestyle with decreased pain PT Goal Formulation: With patient Time For Goal Achievement: 03/17/15 Potential to Achieve Goals: Good Progress towards PT goals: Progressing toward goals    Frequency  7X/week    PT Plan Current plan remains appropriate    Co-evaluation PT/OT/SLP Co-Evaluation/Treatment: Yes Reason for Co-Treatment: For patient/therapist safety PT goals addressed during  session: Mobility/safety with mobility;Proper use of DME OT goals addressed during session: ADL's and self-care     End of Session Equipment Utilized During Treatment: Gait belt;Right knee immobilizer;Left knee immobilizer Activity Tolerance: Patient tolerated treatment well;Patient limited by pain Patient left: in bed;with  call bell/phone within reach;with nursing/sitter in room     Time: 3976-7341 PT Time Calculation (min) (ACUTE ONLY): 23 min  Charges:  $Gait Training: 8-22 mins $Therapeutic Activity: 8-22 mins                    G Codes:      Tara Stanley 04-02-15, 2:36 PM

## 2015-03-11 NOTE — Evaluation (Signed)
Occupational Therapy Evaluation Patient Details Name: Tara Stanley MRN: [id number] DOB: Nov 19, 1956 Today's Date: 03/11/2015    History of Present Illness B TKR   Clinical Impression   This 59 year old female was admitted for the above surgeries.  She will benefit from skilled OT in acute and follow up OT in rehab to increase safety and independence with ADLs.  Pt was very active prior to admission and she is very motivated.  She was limited by nausea/dizziness at time of initial evaluation.  Pt needs up to total A x 2 for LB adls because of this.  She is a great CIR candidate    Follow Up Recommendations  CIR    Equipment Recommendations  3 in 1 bedside comode    Recommendations for Other Services       Precautions / Restrictions Precautions Precautions: Knee;Fall Required Braces or Orthoses: Knee Immobilizer - Right;Knee Immobilizer - Left Knee Immobilizer - Right: Discontinue once straight leg raise with < 10 degree lag Knee Immobilizer - Left: Discontinue once straight leg raise with < 10 degree lag Restrictions Weight Bearing Restrictions: No Other Position/Activity Restrictions: WBAT      Mobility Bed Mobility Overal bed mobility: Needs Assistance;+2 for physical assistance;+ 2 for safety/equipment Bed Mobility: Supine to Sit     Supine to sit: Mod assist;+2 for physical assistance;+2 for safety/equipment     General bed mobility comments: cues for sequence, assist to manage bil LEs and assist to bring trunk to upright  Transfers Overall transfer level: Needs assistance Equipment used: Rolling walker (2 wheeled) Transfers: Sit to/from Stand Sit to Stand: Mod assist;From elevated surface;+2 physical assistance;+2 safety/equipment Stand pivot transfers: Mod assist;+2 physical assistance;+2 safety/equipment       General transfer comment: cues for UE/LE placement; Physical assist for support, balance, RW management    Balance                                             ADL Overall ADL's : Needs assistance/impaired     Grooming: Set up;Sitting   Upper Body Bathing: Set up;Sitting   Lower Body Bathing: Maximal assistance;+2 for physical assistance;Sit to/from stand   Upper Body Dressing : Minimal assistance;Sitting   Lower Body Dressing: Total assistance;+2 for physical assistance;Sit to/from stand   Toilet Transfer: Moderate assistance;+2 for physical assistance;Stand-pivot (to recliner)   Toileting- Clothing Manipulation and Hygiene: Total assistance;+2 for physical assistance;Sit to/from stand         General ADL Comments: pt became nauseas and dizzy when sitting EOB--RN brought pain medication.  Pt limited with ADLS because of this.  Did not introduce AE at this time. Pt is unable to lift legs independently--will plan to introduce reacher with washcloth for bathing     Vision     Perception     Praxis      Pertinent Vitals/Pain Pain Assessment: 0-10 Pain Score: 7  Pain Location: bil knees Pain Descriptors / Indicators: Aching Pain Intervention(s): Limited activity within patient's tolerance;Monitored during session;Premedicated before session;Repositioned;Ice applied     Hand Dominance     Extremity/Trunk Assessment Upper Extremity Assessment Upper Extremity Assessment: Overall WFL for tasks assessed           Communication Communication Communication: No difficulties   Cognition Arousal/Alertness: Awake/alert Behavior During Therapy: WFL for tasks assessed/performed Overall Cognitive Status: Within Functional Limits for tasks assessed  General Comments       Exercises       Shoulder Instructions      Home Living Family/patient expects to be discharged to:: Inpatient rehab Living Arrangements: Spouse/significant other Available Help at Discharge: Family                         Home Equipment: None          Prior  Functioning/Environment Level of Independence: Independent             OT Diagnosis: Generalized weakness;Acute pain   OT Problem List: Decreased strength;Decreased activity tolerance;Decreased knowledge of use of DME or AE;Decreased knowledge of precautions;Pain   OT Treatment/Interventions: Self-care/ADL training;DME and/or AE instruction;Patient/family education;Visual/perceptual remediation/compensation    OT Goals(Current goals can be found in the care plan section) Acute Rehab OT Goals Patient Stated Goal: rehab and home to resume previous lifestyle with decreased pain OT Goal Formulation: With patient Time For Goal Achievement: 03/18/15 Potential to Achieve Goals: Good ADL Goals Pt Will Perform Lower Body Bathing: with min assist;with adaptive equipment;sit to/from stand Pt Will Transfer to Toilet: with min assist;ambulating;bedside commode Pt Will Perform Toileting - Clothing Manipulation and hygiene: with min assist;sit to/from stand  OT Frequency: Min 2X/week   Barriers to D/C:            Co-evaluation   Reason for Co-Treatment: For patient/therapist safety PT goals addressed during session: Mobility/safety with mobility;Proper use of DME OT goals addressed during session: ADL's and self-care      End of Session    Activity Tolerance:  (limited by nausea and dizziness; felt better once in chair) Patient left: in chair;with call bell/phone within reach;with family/visitor present   Time: 8828-0034 OT Time Calculation (min): 40 min Charges:  OT General Charges $OT Visit: 1 Procedure OT Evaluation $Initial OT Evaluation Tier I: 1 Procedure G-Codes:    Azarya Oconnell 2015-04-06, 12:51 PM Lesle Chris, OTR/L [phone] 2015-04-06

## 2015-03-11 NOTE — PMR Pre-admission (Signed)
PMR Admission Coordinator Pre-Admission Assessment  Patient: Tara Stanley is an 59 y.o., female MRN: [id number] DOB: 10/30/1956 Height: 5' 7" (170.2 cm) Weight: 67.132 kg (148 lb)              Insurance Information HMO:   PPO:       PCP:       IPA:       80/20:       OTHER:  Choice Plus plan PRIMARY: [employer]      Policy#: [id number]      Subscriber: [name] CM Name: Gaylan Gerold      Phone#: [phone]     Fax#:   Pre-Cert#: [id number]      Employer: Husband works [employer] Benefits:  Phone #: [phone]     Name: Bobette Mo. Date:  01/01/12     Deduct: $650 (met all)      Out of Pocket Max:  $2500 ( met $679.79)      Life Max: unlimited CIR: 80% w/auth      SNF:  80% w/auth 90 days combined CIR/SNF Outpatient:  80% with 60 visits max, has used 1 visit     Co-Pay: 20% Home Health:  80% with 60 visits max      Co-Pay: 20% DME: 80%     Co-Pay: 20% Providers:  In network  Emergency [place]    Name [place] Spouse [phone]     Current Medical History  Patient Admitting Diagnosis:  B TKR  History of Present Illness: A 59 y.o. right handed female with history of hypertension. Patient independent prior to admission living with her husband and active. Presented 03/08/2015 with end-stage osteoarthritis bilateral knees. No change with conservative care including activity modification. Underwent bilateral total knee arthroplasty 03/09/2015 per Dr.Alusio. Hospital course pain management with epidural removed 03/11/2015. Coumadin for DVT prophylaxis. Weightbearing as tolerated. Acute blood loss anemia 9.0 and monitor. Physical therapy evaluation completed with recommendations of physical medicine rehabilitation consult. M.D. has requested physical medicine rehabilitation consult. Patient to be admitted for comprehensiveinpatient rehabilitation program. : Past Medical History  Past Medical History   Diagnosis Date  . Hypertension   . Thyroid disease      nodule, hypothyroidism pt was on synthyroid but is not having to take currently pt states her MD states she does not have issues with   . Spinal stenosis     mild hx of herniated disc  . Arthritis     osteoarthritis  . Cancer     melanoma- right upper arm  . Osteomyelitis     left leg  . Renal cyst     benign, found on CT scan, is being followed  . History of frequent urinary tract infections     on prophalaytic medication     Family History  family history includes Aneurysm (age of onset: 19) in her maternal aunt; Arthritis in her mother; Diabetes in her mother; Hypertension in her father, mother, and other; Osteoporosis in her maternal grandmother; Stroke in her father, maternal grandmother, and paternal grandfather. There is no history of Colon cancer, Esophageal cancer, Stomach cancer, Rectal cancer, or Thyroid disease.  Prior Rehab/Hospitalizations:  None   Current Medications   Current facility-administered medications:  .  0.9 %  sodium chloride infusion, , Intravenous, Continuous, Arlee Muslim, PA-C, Last Rate: 10 mL/hr at 03/11/15 1821 .  acetaminophen (TYLENOL) tablet 650 mg, 650 mg, Oral, Q6H PRN, 650  mg at 03/10/15 2307 **OR** acetaminophen (TYLENOL) suppository 650 mg, 650 mg, Rectal, Q6H PRN, Gaynelle Arabian, MD .  bisacodyl (DULCOLAX) suppository 10 mg, 10 mg, Rectal, Daily PRN, Gaynelle Arabian, MD, 10 mg at 03/11/15 1409 .  chlorthalidone (HYGROTON) tablet 25 mg, 25 mg, Oral, q morning - 10a, Gaynelle Arabian, MD, 25 mg at 03/13/15 0950 .  diphenhydrAMINE (BENADRYL) 12.5 MG/5ML elixir 12.5-25 mg, 12.5-25 mg, Oral, Q4H PRN, Gaynelle Arabian, MD, 12.5 mg at 03/10/15 2213 .  docusate sodium (COLACE) capsule 100 mg, 100 mg, Oral, BID, Gaynelle Arabian, MD, 100 mg at 03/14/15 0956 .  enoxaparin (LOVENOX) injection 30 mg, 30 mg, Subcutaneous, Q12H, Arlee Muslim, PA-C, 30 mg at 03/14/15 0450 .  losartan (COZAAR) tablet 100 mg, 100  mg, Oral, Daily, Gaynelle Arabian, MD, 100 mg at 03/13/15 0950 .  menthol-cetylpyridinium (CEPACOL) lozenge 3 mg, 1 lozenge, Oral, PRN **OR** phenol (CHLORASEPTIC) mouth spray 1 spray, 1 spray, Mouth/Throat, PRN, Gaynelle Arabian, MD .  methocarbamol (ROBAXIN) tablet 500 mg, 500 mg, Oral, Q6H PRN, 500 mg at 03/13/15 1043 **OR** methocarbamol (ROBAXIN) 500 mg in dextrose 5 % 50 mL IVPB, 500 mg, Intravenous, Q6H PRN, Gaynelle Arabian, MD, 500 mg at 03/10/15 1519 .  metoCLOPramide (REGLAN) tablet 5-10 mg, 5-10 mg, Oral, Q8H PRN **OR** metoCLOPramide (REGLAN) injection 5-10 mg, 5-10 mg, Intravenous, Q8H PRN, Gaynelle Arabian, MD .  morphine 2 MG/ML injection 1-2 mg, 1-2 mg, Intravenous, Q1H PRN, Gaynelle Arabian, MD, 2 mg at 03/11/15 2671 .  nitrofurantoin (MACRODANTIN) capsule 50 mg, 50 mg, Oral, QHS, Gaynelle Arabian, MD, 50 mg at 03/13/15 2118 .  ondansetron (ZOFRAN) tablet 4 mg, 4 mg, Oral, Q6H PRN **OR** ondansetron (ZOFRAN) injection 4 mg, 4 mg, Intravenous, Q6H PRN, Gaynelle Arabian, MD, 4 mg at 03/11/15 1138 .  oxyCODONE (Oxy IR/ROXICODONE) immediate release tablet 5-15 mg, 5-15 mg, Oral, Q3H PRN, Gaynelle Arabian, MD, 5 mg at 03/14/15 0728 .  polyethylene glycol (MIRALAX / GLYCOLAX) packet 17 g, 17 g, Oral, Daily PRN, Gaynelle Arabian, MD .  traMADol Veatrice Bourbon) tablet 50-100 mg, 50-100 mg, Oral, Q6H PRN, Gaynelle Arabian, MD, 50 mg at 03/14/15 0430 .  warfarin (COUMADIN) video, , Does not apply, Once, [employer], RPH .  Warfarin - Pharmacist Dosing Inpatient, , Does not apply, q1800, Minda Ditto, RPH, 0  at 03/13/15 1800  Patients Current Diet: Diet regular  Precautions / Restrictions Precautions Precautions: Knee, Fall Restrictions Weight Bearing Restrictions: No Other Position/Activity Restrictions: WBAT   Prior Activity Level Community (5-7x/wk): Went out daily.  volunteers regularly.  Not working.   Home Assistive Devices / Equipment Home Assistive Devices/Equipment: Eyeglasses Home Equipment:  None  Prior Functional Level Prior Function Level of Independence: Independent  Current Functional Level Cognition  Overall Cognitive Status: Within Functional Limits for tasks assessed Orientation Level: Oriented X4    Extremity Assessment (includes Sensation/Coordination)  Upper Extremity Assessment: Overall WFL for tasks assessed  Lower Extremity Assessment: RLE deficits/detail, LLE deficits/detail RLE Deficits / Details: 2+/5 quads with AAROM at knee -10 - 90 LLE Deficits / Details: 2-/5 quads with AAROM at knee -10 - 90 - pt with min LE control 2* effect of epidural    ADLs  Overall ADL's : Needs assistance/impaired Grooming: Set up, Sitting Upper Body Bathing: Set up, Sitting Lower Body Bathing: Maximal assistance, +2 for physical assistance, Sit to/from stand Upper Body Dressing : Minimal assistance, Sitting Lower Body Dressing: Total assistance, +2 for physical assistance, Sit to/from stand Toilet Transfer: Moderate assistance, +2  for physical assistance, Stand-pivot (to recliner) Toileting- Clothing Manipulation and Hygiene: Total assistance, +2 for physical assistance, Sit to/from stand General ADL Comments: pt became nauseas and dizzy when sitting EOB--RN brought pain medication.  Pt limited with ADLS because of this.  Did not introduce AE at this time. Pt is unable to lift legs independently--could use reacher with washcloth for bathing    Mobility  Overal bed mobility: Needs Assistance, +2 for physical assistance, + 2 for safety/equipment Bed Mobility: Supine to Sit Supine to sit: Min assist Sit to supine: Min assist, Mod assist, +2 for physical assistance, +2 for safety/equipment General bed mobility comments: cues for sequence  with assist to manage bil LEs     Transfers  Overall transfer level: Needs assistance Equipment used: Rolling walker (2 wheeled) Transfers: Sit to/from Stand Sit to Stand: From elevated surface, Min assist, Mod assist, +2  safety/equipment Stand pivot transfers: +2 physical assistance, +2 safety/equipment, Min assist, Mod assist General transfer comment: cues for use of UEs to self assist and for LE management; Physical assist for support, balance, RW management    Ambulation / Gait / Stairs / Wheelchair Mobility  Ambulation/Gait Ambulation/Gait assistance: Min assist Ambulation Distance (Feet): 24 Feet Assistive device: Rolling walker (2 wheeled) Gait Pattern/deviations: Step-to pattern, Decreased step length - right, Decreased step length - left, Shuffle, Trunk flexed Gait velocity: decr General Gait Details: cues for posture, position from RW and sequence    Posture / Balance      Special needs/care consideration BiPAP/CPAP No CPM Yes Continuous Drip IV No Dialysis No        Life Vest No Oxygen No Special Bed No Trach Size No Wound Vac (area) No     Skin Has B TKR incisions with dressings                              Bowel mgmt: Last BM 03/12/15 Bladder mgmt: Voiding WDL Diabetic mgmt No    Previous Home Environment Living Arrangements: Spouse/significant other Available Help at Discharge: Family Type of Home: House Home Layout: Two level, Able to live on main level with bedroom/bathroom Bathroom Shower/Tub: Multimedia programmer: Standard Bathroom Accessibility: Yes Home Care Services: No  Discharge Living Setting Plans for Discharge Living Setting: Patient's home, House, Lives with (comment) (Lives with husband.) Type of Home at Discharge: House Discharge Home Layout: Two level, Able to live on main level with bedroom/bathroom Alternate Level Stairs-Number of Steps: 15-18 steps Discharge Home Access: Level entry Entrance Stairs-Number of Steps: very small lip at entrance, basically level entry. Discharge Bathroom Shower/Tub: Walk-in shower Discharge Bathroom Toilet: Standard Does the patient have any problems obtaining your medications?: No  Social/Family/Support  Systems Patient Roles: Spouse, Parent (Has a husband and 2 daughters, in [place] and [place].) Contact Information: Falisa Lamora - husband Anticipated Caregiver: self, spouse and may hire NA help. Anticipated Caregiver's Contact Information: Randall Hiss - spouse (c) [phone] Ability/Limitations of Caregiver: Husband works and has to travel out of town for his job. Caregiver Availability: Intermittent (May hire NA help through an agency.) Discharge Plan Discussed with Primary Caregiver: Yes Is Caregiver In Agreement with Plan?: Yes Does Caregiver/Family have Issues with Lodging/Transportation while Pt is in Rehab?: No  Goals/Additional Needs Patient/Family Goal for Rehab: PT/OT mod I goals Expected length of stay: 7 days Cultural Considerations: None Dietary Needs: Regular, thin liquids Equipment Needs: TBD Pt/Family Agrees to Admission and willing to participate: Yes Program  Orientation Provided & Reviewed with Pt/Caregiver Including Roles  & Responsibilities: Yes  Decrease burden of Care through IP rehab admission: N/A  Possible need for SNF placement upon discharge: Not planned  Patient Condition: This patient's medical and functional status has changed since the consult dated: 03/10/15 in which the Rehabilitation Physician determined and documented that the patient's condition is appropriate for intensive rehabilitative care in an inpatient rehabilitation facility. See "History of Present Illness" (above) for medical update. Functional changes are:  Currently requiring min/mod assist for transfers and ambulated min assist 24 ft RW. Patient's medical and functional status update has been discussed with the Rehabilitation physician and patient remains appropriate for inpatient rehabilitation. Will admit to inpatient rehab today.  Preadmission Screen Completed By:  Retta Diones, 03/14/2015 9:58 AM ______________________________________________________________________   Discussed status with Dr.  Naaman Plummer on 03/14/15 at [phone] and received telephone approval for admission today.  Admission Coordinator:  Retta Diones, time0958/Date03/14/16

## 2015-03-11 NOTE — Progress Notes (Signed)
Physical Therapy Treatment Patient Details Name: Tara Stanley MRN: [id number] DOB: 03-20-1956 Today's Date: 19-Mar-2015    History of Present Illness B TKR    PT Comments    Pt continues motivated but ltd by increased pain  Follow Up Recommendations  CIR     Equipment Recommendations  None recommended by PT    Recommendations for Other Services OT consult     Precautions / Restrictions Precautions Precautions: Knee;Fall Required Braces or Orthoses: Knee Immobilizer - Right;Knee Immobilizer - Left Knee Immobilizer - Right: Discontinue once straight leg raise with < 10 degree lag Knee Immobilizer - Left: Discontinue once straight leg raise with < 10 degree lag Restrictions Weight Bearing Restrictions: No Other Position/Activity Restrictions: WBAT    Mobility  Bed Mobility                  Transfers                    Ambulation/Gait                 Stairs            Wheelchair Mobility    Modified Rankin (Stroke Patients Only)       Balance                                    Cognition Arousal/Alertness: Awake/alert Behavior During Therapy: WFL for tasks assessed/performed Overall Cognitive Status: Within Functional Limits for tasks assessed                      Exercises Total Joint Exercises Ankle Circles/Pumps: AROM;Both;15 reps;Supine Quad Sets: AROM;Both;10 reps;Supine Heel Slides: AAROM;Both;10 reps;Supine Straight Leg Raises: AAROM;Both;10 reps;Supine Goniometric ROM: R knee -10 - 40, L knee -10 - 35    General Comments        Pertinent Vitals/Pain Pain Assessment: 0-10 Pain Score: 7  Pain Location: Bil knees Pain Descriptors / Indicators: Aching;Sore Pain Intervention(s): Limited activity within patient's tolerance;Monitored during session;Premedicated before session;Ice applied    Home Living                      Prior Function            PT Goals (current goals can  now be found in the care plan section) Acute Rehab PT Goals Patient Stated Goal: rehab and home to resume previous lifestyle with decreased pain PT Goal Formulation: With patient Time For Goal Achievement: 03/17/15 Potential to Achieve Goals: Good Progress towards PT goals: Progressing toward goals    Frequency  7X/week    PT Plan Current plan remains appropriate    Co-evaluation             End of Session   Activity Tolerance: Patient tolerated treatment well;Patient limited by pain Patient left: in bed;with call bell/phone within reach;with family/visitor present     Time: 1962-2297 PT Time Calculation (min) (ACUTE ONLY): 22 min  Charges:  $Therapeutic Exercise: 8-22 mins                    G Codes:      Tara Stanley 03-19-2015, 12:12 PM

## 2015-03-11 NOTE — Progress Notes (Signed)
Subjective: 2 Days Post-Op Procedure(s) (LRB): BILATERAL TOTAL KNEE ARTHROPLASTY (Bilateral) Patient reports pain as moderate and severe.  Husband at bedside. Patient seen in rounds with Dr. Wynelle Link.  Epidural slipped out last night and had moderate to sever increase in pain. Given IV meds and encouraged POs. Patient is having problems with pain in the knees, requiring pain medications Epidural to come out last night.  Increased in pain temporarily after the epidural was removed but a little better this morning so far. Plan is to go Rehab after hospital stay.  Objective: Vital signs in last 24 hours: Temp:  [98.5 F (36.9 C)-98.8 F (37.1 C)] 98.5 F (36.9 C) (03/11 0658) Pulse Rate:  [70-86] 70 (03/11 0658) Resp:  [17-18] 18 (03/11 0658) BP: (103-134)/(62-73) 111/62 mmHg (03/11 0658) SpO2:  [100 %] 100 % (03/11 0658)  Intake/Output from previous day:  Intake/Output Summary (Last 24 hours) at 03/11/15 1109 Last data filed at 03/11/15 1941  Gross per 24 hour  Intake   1320 ml  Output   3650 ml  Net  -2330 ml    Intake/Output this shift: UOP 700 since MN  Labs:  Recent Labs  03/10/15 0530 03/11/15 0542  HGB 10.5* 9.1*    Recent Labs  03/10/15 0530 03/11/15 0542  WBC 8.4 7.8  RBC 3.29* 2.84*  HCT 30.5* 26.7*  PLT 215 185    Recent Labs  03/10/15 0530 03/11/15 0542  NA 138 141  K 3.4* 3.4*  CL 105 107  BUN 11 12  CREATININE 0.52 0.48*  GLUCOSE 144* 94  CALCIUM 8.5 8.6    Recent Labs  03/10/15 0530 03/11/15 0542  INR 0.96 0.94    EXAM General - Patient is Alert and Appropriate Extremity - Neurovascular intact Sensation intact distally Dorsiflexion/Plantar flexion intact Dressing/Incision - clean, dry, no drainage Motor Function - intact, moving feet and toes well on exam.    Past Medical History  Diagnosis Date  . Hypertension   . Thyroid disease      nodule, hypothyroidism pt was on synthyroid but is not having to take currently pt  states her MD states she does not have issues with   . Spinal stenosis     mild hx of herniated disc  . Arthritis     osteoarthritis  . Cancer     melanoma- right upper arm  . Osteomyelitis     left leg  . Renal cyst     benign, found on CT scan, is being followed  . History of frequent urinary tract infections     on prophalaytic medication     Assessment/Plan: 2 Days Post-Op Procedure(s) (LRB): BILATERAL TOTAL KNEE ARTHROPLASTY (Bilateral) Principal Problem:   OA (osteoarthritis) of knee  Estimated body mass index is 23.17 kg/(m^2) as calculated from the following:   Height as of this encounter: 5\' 7"  (1.702 m).   Weight as of this encounter: 67.132 kg (148 lb). Up with therapy Continue foley due to urinary output monitoring and she still has her epidural in place.  Will remove the catheter later this evening around 6 PM.    DVT Prophylaxis - Lovenox and Coumadin, Lovenox will not start until later this afternoon (6 PM) though after the epidural has been removed for twelve hours.   Weight-Bearing as tolerated to both legs  Take Coumadin for four weeks and then discontinue.  The dose may need to be adjusted based upon the INR.  Please follow the INR and titrate Coumadin dose  for a therapeutic range between 2.0 and 3.0 INR.  After completing the four weeks of Coumadin, the patient may stop the Coumadin and resume their 81 mg Aspirin daily.  Lovenox injections will start later this evening after the epidural has been removed and continue until the INR is therapeutic at or greater than 2.0.  When INR reaches the therapeutic level of equal to or greater than 2.0, the patient may discontinue the Lovenox injections.  Arlee Muslim, PA-C Orthopaedic Surgery 03/11/2015, 11:09 AM

## 2015-03-11 NOTE — Progress Notes (Signed)
Physical Therapy Treatment Patient Details Name: Tara Stanley MRN: [id number] DOB: [birth year] Today's Date: 03/11/2015    History of Present Illness B TKR    PT Comments    Pt continues motivated to progress.  Pt ltd this session by increased pain as well as nausea/lighheadedness with activity.    Follow Up Recommendations  CIR     Equipment Recommendations  None recommended by PT    Recommendations for Other Services OT consult     Precautions / Restrictions Precautions Precautions: Knee;Fall Required Braces or Orthoses: Knee Immobilizer - Right;Knee Immobilizer - Left Knee Immobilizer - Right: Discontinue once straight leg raise with < 10 degree lag Knee Immobilizer - Left: Discontinue once straight leg raise with < 10 degree lag Restrictions Weight Bearing Restrictions: No Other Position/Activity Restrictions: WBAT    Mobility  Bed Mobility Overal bed mobility: Needs Assistance;+2 for physical assistance;+ 2 for safety/equipment Bed Mobility: Supine to Sit     Supine to sit: Mod assist;+2 for physical assistance;+2 for safety/equipment     General bed mobility comments: cues for sequence  with assist to manage bil LEs and to bring trunk to upright  Transfers Overall transfer level: Needs assistance Equipment used: Rolling walker (2 wheeled) Transfers: Sit to/from Stand Sit to Stand: Mod assist;From elevated surface;+2 physical assistance;+2 safety/equipment Stand pivot transfers: Mod assist;+2 physical assistance;+2 safety/equipment       General transfer comment: cues for use of UEs to self assist and for LE management; Physical assist for support, balance, RW management  Ambulation/Gait Ambulation/Gait assistance: Mod assist;+2 physical assistance;+2 safety/equipment Ambulation Distance (Feet): 3 Feet Assistive device: Rolling walker (2 wheeled) Gait Pattern/deviations: Step-to pattern;Decreased step length - right;Decreased step length -  left;Shuffle;Trunk flexed Gait velocity: decr   General Gait Details: cues for posture, position from RW and sequence   Stairs            Wheelchair Mobility    Modified Rankin (Stroke Patients Only)       Balance                                    Cognition Arousal/Alertness: Awake/alert Behavior During Therapy: WFL for tasks assessed/performed Overall Cognitive Status: Within Functional Limits for tasks assessed                      Exercises Total Joint Exercises Ankle Circles/Pumps: AROM;Both;15 reps;Supine Quad Sets: AROM;Both;10 reps;Supine Heel Slides: AAROM;Both;10 reps;Supine Straight Leg Raises: AAROM;Both;10 reps;Supine Goniometric ROM: R knee -10 - 40, L knee -10 - 35    General Comments        Pertinent Vitals/Pain Pain Assessment: 0-10 Pain Score: 7  Pain Location: Bil knees Pain Descriptors / Indicators: Aching;Sore Pain Intervention(s): Limited activity within patient's tolerance;Monitored during session;Premedicated before session;Ice applied    Home Living                      Prior Function            PT Goals (current goals can now be found in the care plan section) Acute Rehab PT Goals Patient Stated Goal: rehab and home to resume previous lifestyle with decreased pain PT Goal Formulation: With patient Time For Goal Achievement: 03/17/15 Potential to Achieve Goals: Good Progress towards PT goals: Progressing toward goals    Frequency  7X/week    PT Plan Current plan remains appropriate  Co-evaluation PT/OT/SLP Co-Evaluation/Treatment: Yes Reason for Co-Treatment: For patient/therapist safety PT goals addressed during session: Mobility/safety with mobility;Proper use of DME OT goals addressed during session: ADL's and self-care     End of Session Equipment Utilized During Treatment: Gait belt;Right knee immobilizer;Left knee immobilizer Activity Tolerance: Patient tolerated treatment  well;Patient limited by pain Patient left: in chair;with call bell/phone within reach;with family/visitor present     Time: 1119-1200 PT Time Calculation (min) (ACUTE ONLY): 41 min  Charges:  $Gait Training: 8-22 mins $Therapeutic Exercise: 8-22 mins $Therapeutic Activity: 8-22 mins                    G Codes:      Mohamed Portlock 04/05/15, 12:18 PM

## 2015-03-11 NOTE — Progress Notes (Signed)
ANTICOAGULATION CONSULT NOTE - Follow Up Consult  Pharmacy Consult for warfarin Indication: VTE prophylaxis while on epidural anesthesia  Allergies  Allergen Reactions  . Hydrocodone Other (See Comments)    Makes me goofy    Patient Measurements: Height: 5\' 7"  (170.2 cm) Weight: 148 lb (67.132 kg) IBW/kg (Calculated) : 61.6   Vital Signs: Temp: 98.5 F (36.9 C) (03/11 0658) Temp Source: Oral (03/11 0658) BP: 111/62 mmHg (03/11 0658) Pulse Rate: 70 (03/11 0658)  Labs:  Recent Labs  03/10/15 0530 03/11/15 0542  HGB 10.5* 9.1*  HCT 30.5* 26.7*  PLT 215 185  LABPROT 12.9 12.6  INR 0.96 0.94  CREATININE 0.52 0.48*    Estimated Creatinine Clearance: 72.7 mL/min (by C-G formula based on Cr of 0.48).   Medications:  Prescriptions prior to admission  Medication Sig Dispense Refill Last Dose  . Biotin 1 MG CAPS Take 1 tablet by mouth every morning.   Past Week at Unknown time  . BLACK COHOSH PO Take 300 mg by mouth every morning.    Past Week at Unknown time  . chlorthalidone (HYGROTON) 25 MG tablet Take 25 mg by mouth every morning.    03/08/2015 at 0700  . CRANBERRY PO Take 465 mg by mouth every morning.   Past Week at Unknown time  . estradiol (ESTRING) 2 MG vaginal ring Place 2 mg vaginally every 3 (three) months. 1 each 3 03/05/2015 at out  . losartan (COZAAR) 100 MG tablet TAKE 1 TABLET DAILY 90 tablet 3 03/09/2015 at 0700  . meloxicam (MOBIC) 7.5 MG tablet Take 15 mg by mouth every morning.   5 03/08/2015 at 0700  . Multiple Vitamin (MULTIVITAMIN) tablet Take 1 tablet by mouth every morning.    Past Week at Unknown time  . nitrofurantoin (MACRODANTIN) 50 MG capsule Take 50 mg by mouth at bedtime.   03/07/2015 at 2000  . cephALEXin (KEFLEX) 500 MG capsule Take 1 capsule (500 mg total) by mouth 4 (four) times daily. Take all of medicine and drink lots of fluids (Patient not taking: Reported on 02/18/2015) 20 capsule 0 Taking   Scheduled:  . chlorthalidone  25 mg Oral q morning  - 10a  . docusate sodium  100 mg Oral BID  . losartan  100 mg Oral Daily  . nitrofurantoin  50 mg Oral QHS  . warfarin   Does not apply Once  . Warfarin - Pharmacist Dosing Inpatient   Does not apply Daily   Infusions:  . sodium chloride 50 mL/hr (03/10/15 0816)  . ropivacaine (PF) 2 mg/ml (0.2%) 14 mL/hr (03/10/15 1518)   PRN: acetaminophen **OR** acetaminophen, bisacodyl, diphenhydrAMINE, menthol-cetylpyridinium **OR** phenol, methocarbamol **OR** methocarbamol (ROBAXIN)  IV, metoCLOPramide **OR** metoCLOPramide (REGLAN) injection, morphine injection, ondansetron **OR** ondansetron (ZOFRAN) IV, oxyCODONE, polyethylene glycol, traMADol  Assessment: 58yoF s/p bilateral TKA on ropivacaine epidural; pharmacy consulted to dose warfarin for VTE prophylaxis  Today, 03/11/2015:   epidural catheter documented as removed this morning ~9am without any issues  CBC with Hgb slightly low c/w ABLA, Plt wnl  No bleeding issues noted per nursing  No major medication interactions  INR today 0.94, no increase after 4mg  dose yesterday as expected  Eating 100% of meals  Goal of Therapy:  INR 2-3  Plan:   warfarin 5mg  po x 1 @ 1800   Daily INR  CBC q72 hr while on warfarin  Monitor for signs of bleeding  Dolly Rias RPh 03/11/2015, 11:00 AM Pager [phone]

## 2015-03-11 NOTE — Progress Notes (Signed)
Epidural came out yesterday during PT and patient had significant pain in the middle of the night.  Infusion turned off last night and she was started on other pain medicine with adequate relief.  I removed the catheter this morning.  Site looked good.  Sabri Teal MD

## 2015-03-11 NOTE — Progress Notes (Signed)
When pt's epidural site was checked, it was wet and slightly  Puffy.  Epidural stopped and anesthesia called.  Enrigue Catena CRNA responded and said the epidural had come out of position and advised pt.  Pt and anesthesia agreed to removing epidural in am since it hurts to move pt.  Additional pain meds given.

## 2015-03-11 NOTE — Progress Notes (Signed)
Rehab admissions - Evaluated for possible admission.  I spoke with Dr. Wynelle Link and patient will not be ready until Monday for inpatient rehab.  I met with patient and her husband at the bedside.  I explained inpatient rehab, spoke about [facility] insurance and went over process of getting authorization extensively.  Patient and spouse very interested in inpatient rehab.  I will begin authorization process and will follow up Monday am for plans.  Call me for questions.  #[phone]

## 2015-03-12 LAB — CBC
HCT: 26.3 % — ABNORMAL LOW (ref 36.0–46.0)
Hemoglobin: 9 g/dL — ABNORMAL LOW (ref 12.0–15.0)
MCH: 32.1 pg (ref 26.0–34.0)
MCHC: 34.2 g/dL (ref 30.0–36.0)
MCV: 93.9 fL (ref 78.0–100.0)
Platelets: 174 10*3/uL (ref 150–400)
RBC: 2.8 MIL/uL — ABNORMAL LOW (ref 3.87–5.11)
RDW: 12.3 % (ref 11.5–15.5)
WBC: 7.3 10*3/uL (ref 4.0–10.5)

## 2015-03-12 LAB — PROTIME-INR
INR: 0.95 (ref 0.00–1.49)
Prothrombin Time: 12.8 seconds (ref 11.6–15.2)

## 2015-03-12 MED ORDER — WARFARIN - PHARMACIST DOSING INPATIENT: Freq: Every day | Status: DC

## 2015-03-12 MED ORDER — WARFARIN SODIUM 5 MG PO TABS
5.0000 mg | ORAL_TABLET | Freq: Once | ORAL | Status: AC
  Administered 2015-03-12: 5 mg via ORAL
  Filled 2015-03-12: qty 1

## 2015-03-12 NOTE — Progress Notes (Signed)
Physical Therapy Treatment Patient Details Name: Tara Stanley MRN: [id number] DOB: [birth year] Today's Date: 03/12/2015    History of Present Illness B TKR    PT Comments    Pt progressing steadily - ltd this date by fatigue/pain  Follow Up Recommendations  CIR     Equipment Recommendations  None recommended by PT    Recommendations for Other Services OT consult     Precautions / Restrictions Precautions Precautions: Knee;Fall Required Braces or Orthoses: Knee Immobilizer - Right;Knee Immobilizer - Left Knee Immobilizer - Right: Discontinue once straight leg raise with < 10 degree lag Knee Immobilizer - Left: Discontinue once straight leg raise with < 10 degree lag Restrictions Weight Bearing Restrictions: No Other Position/Activity Restrictions: WBAT    Mobility  Bed Mobility Overal bed mobility: Needs Assistance;+2 for physical assistance;+ 2 for safety/equipment Bed Mobility: Supine to Sit     Supine to sit: Min assist;Mod assist;+2 for physical assistance     General bed mobility comments: cues for sequence  with assist to manage bil LEs and to bring trunk to upright  Transfers Overall transfer level: Needs assistance Equipment used: Rolling walker (2 wheeled)   Sit to Stand: Mod assist;+2 physical assistance;+2 safety/equipment         General transfer comment: cues for use of UEs to self assist and for LE management; Physical assist for support, balance, RW management  Ambulation/Gait Ambulation/Gait assistance: Min assist;Mod assist Ambulation Distance (Feet): 15 Feet Assistive device: Rolling walker (2 wheeled) Gait Pattern/deviations: Step-to pattern;Decreased step length - right;Decreased step length - left;Shuffle;Antalgic;Trunk flexed Gait velocity: decr   General Gait Details: cues for posture, position from RW and sequence   Stairs            Wheelchair Mobility    Modified Rankin (Stroke Patients Only)       Balance                                     Cognition Arousal/Alertness: Awake/alert Behavior During Therapy: WFL for tasks assessed/performed Overall Cognitive Status: Within Functional Limits for tasks assessed                      Exercises Total Joint Exercises Ankle Circles/Pumps: AROM;Both;Supine;20 reps Quad Sets: AROM;Both;Supine;15 reps Heel Slides: AAROM;Both;Supine;20 reps Straight Leg Raises: AAROM;Both;Supine;20 reps Goniometric ROM: R knee -10 - 50; L knee -10 - 40    General Comments        Pertinent Vitals/Pain Pain Assessment: 0-10 Pain Score: 6  Pain Location: Bil knees Pain Descriptors / Indicators: Aching;Sore Pain Intervention(s): Limited activity within patient's tolerance;Monitored during session;Premedicated before session;Ice applied    Home Living                      Prior Function            PT Goals (current goals can now be found in the care plan section) Acute Rehab PT Goals Patient Stated Goal: rehab and home to resume previous lifestyle with decreased pain PT Goal Formulation: With patient Time For Goal Achievement: 03/17/15 Potential to Achieve Goals: Good Progress towards PT goals: Progressing toward goals    Frequency  7X/week    PT Plan Current plan remains appropriate    Co-evaluation             End of Session Equipment Utilized During Treatment: Gait belt;Right knee immobilizer;Left  knee immobilizer Activity Tolerance: Patient tolerated treatment well;Patient limited by pain Patient left: in chair;with call bell/phone within reach     Time: 0931-1015 PT Time Calculation (min) (ACUTE ONLY): 44 min  Charges:  $Gait Training: 8-22 mins $Therapeutic Exercise: 23-37 mins                    G Codes:      Tara Stanley 26-Mar-2015, 1:49 PM

## 2015-03-12 NOTE — Progress Notes (Signed)
   Subjective: 3 Days Post-Op Procedure(s) (LRB): BILATERAL TOTAL KNEE ARTHROPLASTY (Bilateral) Patient reports pain as moderate.  Much better controlled today. Feels fatigued Plan is to go Rehab after hospital stay.  Objective: Vital signs in last 24 hours: Temp:  [98 F (36.7 C)-99.3 F (37.4 C)] 99.3 F (37.4 C) (03/12 0603) Pulse Rate:  [66-83] 82 (03/12 0603) Resp:  [16-18] 16 (03/12 0603) BP: (106-114)/(49-57) 106/49 mmHg (03/12 0603) SpO2:  [94 %-100 %] 99 % (03/12 0603)  Intake/Output from previous day:  Intake/Output Summary (Last 24 hours) at 03/12/15 0750 Last data filed at 03/11/15 1824  Gross per 24 hour  Intake      0 ml  Output   1400 ml  Net  -1400 ml    Intake/Output this shift:    Labs:  Recent Labs  03/10/15 0530 03/11/15 0542 03/12/15 0520  HGB 10.5* 9.1* 9.0*    Recent Labs  03/11/15 0542 03/12/15 0520  WBC 7.8 7.3  RBC 2.84* 2.80*  HCT 26.7* 26.3*  PLT 185 174    Recent Labs  03/10/15 0530 03/11/15 0542  NA 138 141  K 3.4* 3.4*  CL 105 107  CO2 25 26  BUN 11 12  CREATININE 0.52 0.48*  GLUCOSE 144* 94  CALCIUM 8.5 8.6    Recent Labs  03/11/15 0542 03/12/15 0520  INR 0.94 0.95    EXAM General - Patient is Alert, Appropriate and Oriented Extremity - Neurologically intact Neurovascular intact No cellulitis present Compartment soft Dressing/Incision - clean, dry, no drainage Motor Function - intact, moving foot and toes well on exam.   Past Medical History  Diagnosis Date  . Hypertension   . Thyroid disease      nodule, hypothyroidism pt was on synthyroid but is not having to take currently pt states her MD states she does not have issues with   . Spinal stenosis     mild hx of herniated disc  . Arthritis     osteoarthritis  . Cancer     melanoma- right upper arm  . Osteomyelitis     left leg  . Renal cyst     benign, found on CT scan, is being followed  . History of frequent urinary tract infections    on prophalaytic medication     Assessment/Plan: 3 Days Post-Op Procedure(s) (LRB): BILATERAL TOTAL KNEE ARTHROPLASTY (Bilateral) Principal Problem:   OA (osteoarthritis) of knee   Up with therapy D/C IV fluids  Hopeful discharge to CIR Monday 3/14  DVT Prophylaxis - Lovenox and Coumadin Weight-Bearing as tolerated to bilateral legs  Kvion Shapley V 03/12/2015, 7:50 AM

## 2015-03-12 NOTE — Plan of Care (Signed)
Problem: Consults Goal: Diagnosis- Total Joint Replacement Outcome: Completed/Met Date Met:  03/12/15 Primary Total Knee BILATERAL

## 2015-03-12 NOTE — Progress Notes (Signed)
Physical Therapy Treatment Patient Details Name: Tara Stanley MRN: [id number] DOB: 1956/12/15 Today's Date: 03/12/2015    History of Present Illness B TKR    PT Comments      Follow Up Recommendations  CIR     Equipment Recommendations  None recommended by PT    Recommendations for Other Services OT consult     Precautions / Restrictions Precautions Precautions: Knee;Fall Required Braces or Orthoses: Knee Immobilizer - Right;Knee Immobilizer - Left Knee Immobilizer - Right: Discontinue once straight leg raise with < 10 degree lag Knee Immobilizer - Left: Discontinue once straight leg raise with < 10 degree lag Restrictions Weight Bearing Restrictions: No Other Position/Activity Restrictions: WBAT    Mobility  Bed Mobility Overal bed mobility: Needs Assistance;+2 for physical assistance;+ 2 for safety/equipment Bed Mobility: Sit to Supine     Supine to sit: Min assist;Mod assist;+2 for physical assistance Sit to supine: Min assist;Mod assist;+2 for physical assistance;+2 for safety/equipment   General bed mobility comments: cues for sequence  with assist to manage bil LEs   Transfers Overall transfer level: Needs assistance Equipment used: Rolling walker (2 wheeled) Transfers: Sit to/from Stand Sit to Stand: Mod assist;+2 physical assistance;+2 safety/equipment Stand pivot transfers: +2 physical assistance;+2 safety/equipment;Min assist;Mod assist       General transfer comment: cues for use of UEs to self assist and for LE management; Physical assist for support, balance, RW management  Ambulation/Gait Ambulation/Gait assistance: Min assist;Mod assist;+2 physical assistance;+2 safety/equipment Ambulation Distance (Feet): 6 Feet Assistive device: Rolling walker (2 wheeled) Gait Pattern/deviations: Step-to pattern;Decreased step length - right;Decreased step length - left;Shuffle Gait velocity: decr   General Gait Details: cues for posture, position from RW  and sequence   Stairs            Wheelchair Mobility    Modified Rankin (Stroke Patients Only)       Balance                                    Cognition Arousal/Alertness: Awake/alert Behavior During Therapy: WFL for tasks assessed/performed Overall Cognitive Status: Within Functional Limits for tasks assessed                      Exercises Total Joint Exercises Ankle Circles/Pumps: AROM;Both;Supine;20 reps Quad Sets: AROM;Both;Supine;15 reps Heel Slides: AAROM;Both;Supine;20 reps Straight Leg Raises: AAROM;Both;Supine;20 reps Goniometric ROM: R knee -10 - 50; L knee -10 - 40    General Comments        Pertinent Vitals/Pain Pain Assessment: 0-10 Pain Score: 6  Pain Location: Bil knees Pain Descriptors / Indicators: Aching;Sore Pain Intervention(s): Limited activity within patient's tolerance;Monitored during session;Premedicated before session;Ice applied    Home Living                      Prior Function            PT Goals (current goals can now be found in the care plan section) Acute Rehab PT Goals Patient Stated Goal: rehab and home to resume previous lifestyle with decreased pain PT Goal Formulation: With patient Time For Goal Achievement: 03/17/15 Potential to Achieve Goals: Good Progress towards PT goals: Progressing toward goals    Frequency  7X/week    PT Plan Current plan remains appropriate    Co-evaluation             End of Session Equipment  Utilized During Treatment: Gait belt;Right knee immobilizer;Left knee immobilizer Activity Tolerance: Patient tolerated treatment well;Patient limited by pain Patient left: in bed;with call bell/phone within reach     Time: 1252-1315 PT Time Calculation (min) (ACUTE ONLY): 23 min  Charges:  $Gait Training: 8-22 mins $Therapeutic Exercise: 23-37 mins $Therapeutic Activity: 8-22 mins                    G Codes:      Luisalberto Beegle 03-13-15, 1:55  PM

## 2015-03-12 NOTE — Progress Notes (Signed)
ANTICOAGULATION CONSULT NOTE - Follow Up Consult  Pharmacy Consult for warfarin Indication: VTE prophylaxis   Allergies  Allergen Reactions  . Hydrocodone Other (See Comments)    Makes me goofy   Patient Measurements: Height: 5\' 7"  (170.2 cm) Weight: 148 lb (67.132 kg) IBW/kg (Calculated) : 61.6  Vital Signs: Temp: 99.3 F (37.4 C) (03/12 0603) Temp Source: Oral (03/12 0603) BP: 104/79 mmHg (03/12 1010) Pulse Rate: 74 (03/12 1010)  Labs:  Recent Labs  03/10/15 0530 03/11/15 0542 03/12/15 0520  HGB 10.5* 9.1* 9.0*  HCT 30.5* 26.7* 26.3*  PLT 215 185 174  LABPROT 12.9 12.6 12.8  INR 0.96 0.94 0.95  CREATININE 0.52 0.48*  --    Estimated Creatinine Clearance: 72.7 mL/min (by C-G formula based on Cr of 0.48).  Scheduled:  . chlorthalidone  25 mg Oral q morning - 10a  . docusate sodium  100 mg Oral BID  . enoxaparin (LOVENOX) injection  30 mg Subcutaneous Q12H  . losartan  100 mg Oral Daily  . nitrofurantoin  50 mg Oral QHS  . warfarin   Does not apply Once  . Warfarin - Pharmacist Dosing Inpatient   Does not apply Daily   Assessment: 58yoF s/p bilateral TKA on ropivacaine epidural; pharmacy consulted to dose warfarin for VTE prophylaxis  Today, 03/12/2015:   epidural catheter documented as removed this 3/11 ~9am without any issues  Lovenox 30mg  bid begun 3/11 pm  CBC with Hgb slightly low c/w ABLA, Plt wnl  No bleeding issues noted per nursing  INR today 0.95, s/p 4,5mg  Warfarin  Eating 100% of meals  Goal of Therapy:  INR 2-3  Plan:   Warfarin 5mg  po x 1 @ 1800   Daily INR  CBC q72 hr while on warfarin  Monitor for signs of bleeding  Minda Ditto PharmD Pager [phone] 03/12/2015, 12:42 PM

## 2015-03-13 LAB — PROTIME-INR
INR: 1.01 (ref 0.00–1.49)
Prothrombin Time: 13.4 seconds (ref 11.6–15.2)

## 2015-03-13 MED ORDER — WARFARIN SODIUM 7.5 MG PO TABS
7.5000 mg | ORAL_TABLET | Freq: Once | ORAL | Status: AC
  Administered 2015-03-13: 7.5 mg via ORAL
  Filled 2015-03-13: qty 1

## 2015-03-13 NOTE — Progress Notes (Signed)
Subjective: 4 Days Post-Op Procedure(s) (LRB): BILATERAL TOTAL KNEE ARTHROPLASTY (Bilateral) Patient reports pain as moderate to bilateral knees. Tolerating PO's well. Progressing with PT. Denies SOB, CP, of bilateral calf pain.   Objective: Vital signs in last 24 hours: Temp:  [99.2 F (37.3 C)-99.7 F (37.6 C)] 99.7 F (37.6 C) (03/13 0535) Pulse Rate:  [74-87] 85 (03/13 0950) Resp:  [16-20] 20 (03/13 0535) BP: (101-121)/(49-79) 121/51 mmHg (03/13 0950) SpO2:  [99 %-100 %] 99 % (03/13 0535)  Intake/Output from previous day: 03/12 0701 - 03/13 0700 In: 600 [P.O.:600] Out: 1550 [Urine:1550] Intake/Output this shift:     Recent Labs  03/11/15 0542 03/12/15 0520  HGB 9.1* 9.0*    Recent Labs  03/11/15 0542 03/12/15 0520  WBC 7.8 7.3  RBC 2.84* 2.80*  HCT 26.7* 26.3*  PLT 185 174    Recent Labs  03/11/15 0542  NA 141  K 3.4*  CL 107  CO2 26  BUN 12  CREATININE 0.48*  GLUCOSE 94  CALCIUM 8.6    Recent Labs  03/12/15 0520 03/13/15 0427  INR 0.95 1.01    Well nourished. Alert and oriented x3. RRR, Lungs clear, BS x4. Abdomen soft and non tender.   Right Calf soft and non tender. Right knee dressing C/D/I. No DVT signs. Compartment soft. No signs of infection.  Right LE neurovascular intact.    Left Calf soft and non tender. L knee dressing C/D/I. No DVT signs. No signs of infection or compartment syndrome. LLE neurovascularly intact.  Assessment/Plan: 4 Days Post-Op Procedure(s) (LRB): BILATERAL TOTAL KNEE ARTHROPLASTY (Bilateral) Work with PT  Hopeful discharge to [facility] Monday 3/14  DVT Prophylaxis - Lovenox and Coumadin Weight-Bearing as tolerated to bilateral legs Continue current care.  Hazim Treadway L 03/13/2015, 9:56 AM

## 2015-03-13 NOTE — Progress Notes (Signed)
Physical Therapy Treatment Patient Details Name: Tara Stanley MRN: [id number] DOB: [birth year] Today's Date: 03/13/2015    History of Present Illness B TKR    PT Comments    Pt continues motivated and progressing steadily with mobility.  Follow Up Recommendations  CIR     Equipment Recommendations  None recommended by PT    Recommendations for Other Services OT consult     Precautions / Restrictions Precautions Precautions: Knee;Fall Required Braces or Orthoses: Knee Immobilizer - Right;Knee Immobilizer - Left Knee Immobilizer - Right: Discontinue once straight leg raise with < 10 degree lag (Pt performed IND SLR on R this am) Knee Immobilizer - Left: Discontinue once straight leg raise with < 10 degree lag Restrictions Weight Bearing Restrictions: No Other Position/Activity Restrictions: WBAT    Mobility  Bed Mobility Overal bed mobility: Needs Assistance;+2 for physical assistance;+ 2 for safety/equipment Bed Mobility: Supine to Sit     Supine to sit: Min assist     General bed mobility comments: cues for sequence  with assist to manage bil LEs   Transfers Overall transfer level: Needs assistance Equipment used: Rolling walker (2 wheeled) Transfers: Sit to/from Stand Sit to Stand: From elevated surface;Min assist;Mod assist;+2 safety/equipment         General transfer comment: cues for use of UEs to self assist and for LE management; Physical assist for support, balance, RW management  Ambulation/Gait Ambulation/Gait assistance: Min assist Ambulation Distance (Feet): 24 Feet Assistive device: Rolling walker (2 wheeled) Gait Pattern/deviations: Step-to pattern;Decreased step length - right;Decreased step length - left;Shuffle;Trunk flexed Gait velocity: decr   General Gait Details: cues for posture, position from RW and sequence   Stairs            Wheelchair Mobility    Modified Rankin (Stroke Patients Only)       Balance                                     Cognition Arousal/Alertness: Awake/alert Behavior During Therapy: WFL for tasks assessed/performed Overall Cognitive Status: Within Functional Limits for tasks assessed                      Exercises Total Joint Exercises Ankle Circles/Pumps: AROM;Both;Supine;20 reps Quad Sets: AROM;Both;Supine;20 reps Heel Slides: AAROM;Both;Supine;20 reps Straight Leg Raises: AAROM;Both;Supine;20 reps Goniometric ROM: R knee -10 - 70; L knee -10 - 50    General Comments        Pertinent Vitals/Pain Pain Assessment: 0-10 Pain Score: 6  Pain Location: Bil knees Pain Descriptors / Indicators: Aching;Sore Pain Intervention(s): Limited activity within patient's tolerance;Monitored during session;Premedicated before session;Ice applied    Home Living                      Prior Function            PT Goals (current goals can now be found in the care plan section) Acute Rehab PT Goals Patient Stated Goal: rehab and home to resume previous lifestyle with decreased pain PT Goal Formulation: With patient Time For Goal Achievement: 03/17/15 Potential to Achieve Goals: Good Progress towards PT goals: Progressing toward goals    Frequency  7X/week    PT Plan Current plan remains appropriate    Co-evaluation             End of Session Equipment Utilized During Treatment: Gait belt;Left knee immobilizer  Activity Tolerance: Patient tolerated treatment well;Patient limited by fatigue Patient left: with call bell/phone within reach;in chair     Time: 1700-1749 PT Time Calculation (min) (ACUTE ONLY): 38 min  Charges:  $Gait Training: 8-22 mins $Therapeutic Exercise: 23-37 mins                    G Codes:      [facility] 04-07-2015, 9:35 AM

## 2015-03-13 NOTE — Progress Notes (Signed)
ANTICOAGULATION CONSULT NOTE - Follow Up Consult  Pharmacy Consult for warfarin Indication: VTE prophylaxis   Allergies  Allergen Reactions  . Hydrocodone Other (See Comments)    Makes me goofy   Patient Measurements: Height: 5\' 7"  (170.2 cm) Weight: 148 lb (67.132 kg) IBW/kg (Calculated) : 61.6  Vital Signs: Temp: 99.7 F (37.6 C) (03/13 0535) Temp Source: Oral (03/13 0535) BP: 121/51 mmHg (03/13 0950) Pulse Rate: 85 (03/13 0950)  Labs:  Recent Labs  03/11/15 0542 03/12/15 0520 03/13/15 0427  HGB 9.1* 9.0*  --   HCT 26.7* 26.3*  --   PLT 185 174  --   LABPROT 12.6 12.8 13.4  INR 0.94 0.95 1.01  CREATININE 0.48*  --   --    Estimated Creatinine Clearance: 72.7 mL/min (by C-G formula based on Cr of 0.48).  Scheduled:  . chlorthalidone  25 mg Oral q morning - 10a  . docusate sodium  100 mg Oral BID  . enoxaparin (LOVENOX) injection  30 mg Subcutaneous Q12H  . losartan  100 mg Oral Daily  . nitrofurantoin  50 mg Oral QHS  . warfarin   Does not apply Once  . Warfarin - Pharmacist Dosing Inpatient   Does not apply q1800   Assessment: 58yoF s/p bilateral TKA on ropivacaine epidural post-op; pharmacy consulted to dose warfarin for VTE prophylaxis  Today, 03/13/2015:   epidural catheter documented as removed 3/11 ~9am without any issues  Lovenox 30mg  bid begun 3/11 pm  Hgb low, stable, Plt wnl yesterday  No bleeding issues noted per nursing  INR today 1.01, s/p 4,5,5mg  Warfarin  Eating 100% of meals  Goal of Therapy:  INR 2-3  Plan:   Warfarin 7.5 mg po x 1 @ 12N  Daily INR  CBC q72 hr while on warfarin  Monitor for signs of bleeding  Minda Ditto PharmD Pager [phone] 03/13/2015, 11:16 AM

## 2015-03-14 ENCOUNTER — Encounter (HOSPITAL_COMMUNITY): Payer: Self-pay

## 2015-03-14 ENCOUNTER — Inpatient Hospital Stay (HOSPITAL_COMMUNITY)
Admission: RE | Admit: 2015-03-14 | Discharge: 2015-03-19 | DRG: 560 | Disposition: A | Discharge status: Home Health | Payer: 59 | Source: Intra-hospital | Attending: Physical Medicine & Rehabilitation | Admitting: Physical Medicine & Rehabilitation

## 2015-03-14 DIAGNOSIS — I10 Essential (primary) hypertension: Secondary | ICD-10-CM | POA: Diagnosis present

## 2015-03-14 DIAGNOSIS — Z96653 Presence of artificial knee joint, bilateral: Secondary | ICD-10-CM | POA: Diagnosis present

## 2015-03-14 DIAGNOSIS — Z87440 Personal history of urinary (tract) infections: Secondary | ICD-10-CM | POA: Diagnosis not present

## 2015-03-14 DIAGNOSIS — Z471 Aftercare following joint replacement surgery: Secondary | ICD-10-CM | POA: Diagnosis present

## 2015-03-14 DIAGNOSIS — K5900 Constipation, unspecified: Secondary | ICD-10-CM | POA: Diagnosis present

## 2015-03-14 DIAGNOSIS — D62 Acute posthemorrhagic anemia: Secondary | ICD-10-CM | POA: Diagnosis present

## 2015-03-14 DIAGNOSIS — Z87891 Personal history of nicotine dependence: Secondary | ICD-10-CM

## 2015-03-14 LAB — PROTIME-INR
INR: 1.02 (ref 0.00–1.49)
PROTHROMBIN TIME: 13.5 s (ref 11.6–15.2)

## 2015-03-14 MED ORDER — TRAMADOL HCL 50 MG PO TABS: 50.0000 mg | ORAL_TABLET | Freq: Four times a day (QID) | ORAL | Status: DC | PRN

## 2015-03-14 MED ORDER — WARFARIN SODIUM 7.5 MG PO TABS
7.5000 mg | ORAL_TABLET | Freq: Once | ORAL | Status: AC
  Administered 2015-03-14: 7.5 mg via ORAL
  Filled 2015-03-14: qty 1

## 2015-03-14 MED ORDER — CHLORTHALIDONE 25 MG PO TABS
25.0000 mg | ORAL_TABLET | Freq: Every morning | ORAL | Status: DC
  Administered 2015-03-15: 25 mg via ORAL
  Filled 2015-03-14: qty 1

## 2015-03-14 MED ORDER — POLYETHYLENE GLYCOL 3350 17 G PO PACK
17.0000 g | PACK | Freq: Every day | ORAL | Status: DC | PRN
  Filled 2015-03-14: qty 1

## 2015-03-14 MED ORDER — OXYCODONE HCL 5 MG PO TABS: 5.0000 mg | ORAL_TABLET | ORAL | Status: DC | PRN

## 2015-03-14 MED ORDER — DOCUSATE SODIUM 100 MG PO CAPS: 100.0000 mg | ORAL_CAPSULE | Freq: Two times a day (BID) | ORAL | Status: DC

## 2015-03-14 MED ORDER — BISACODYL 10 MG RE SUPP: 10.0000 mg | Freq: Every day | RECTAL | Status: DC | PRN

## 2015-03-14 MED ORDER — WARFARIN - PHARMACIST DOSING INPATIENT: Freq: Every day | Status: DC

## 2015-03-14 MED ORDER — RIVAROXABAN 10 MG PO TABS
10.0000 mg | ORAL_TABLET | Freq: Every day | ORAL | Status: DC
  Administered 2015-03-14: 10 mg via ORAL
  Filled 2015-03-14: qty 1

## 2015-03-14 MED ORDER — RIVAROXABAN 10 MG PO TABS: 10.0000 mg | ORAL_TABLET | Freq: Every day | ORAL | Status: DC

## 2015-03-14 MED ORDER — METHOCARBAMOL 500 MG PO TABS: 500.0000 mg | ORAL_TABLET | Freq: Four times a day (QID) | ORAL | Status: DC | PRN

## 2015-03-14 MED ORDER — ONDANSETRON HCL 4 MG/2ML IJ SOLN: 4.0000 mg | Freq: Four times a day (QID) | INTRAMUSCULAR | Status: DC | PRN

## 2015-03-14 MED ORDER — POLYETHYLENE GLYCOL 3350 17 G PO PACK: 17.0000 g | PACK | Freq: Every day | ORAL | Status: DC | PRN

## 2015-03-14 MED ORDER — ACETAMINOPHEN 325 MG PO TABS: 650.0000 mg | ORAL_TABLET | Freq: Four times a day (QID) | ORAL | Status: DC | PRN

## 2015-03-14 MED ORDER — DOCUSATE SODIUM 100 MG PO CAPS
100.0000 mg | ORAL_CAPSULE | Freq: Two times a day (BID) | ORAL | Status: DC
  Administered 2015-03-14 – 2015-03-18 (×9): 100 mg via ORAL
  Filled 2015-03-14 (×12): qty 1

## 2015-03-14 MED ORDER — ONDANSETRON HCL 4 MG PO TABS: 4.0000 mg | ORAL_TABLET | Freq: Four times a day (QID) | ORAL | Status: DC | PRN

## 2015-03-14 MED ORDER — METHOCARBAMOL 500 MG PO TABS
500.0000 mg | ORAL_TABLET | Freq: Four times a day (QID) | ORAL | Status: DC | PRN
  Administered 2015-03-15 – 2015-03-18 (×9): 500 mg via ORAL
  Filled 2015-03-14 (×11): qty 1

## 2015-03-14 MED ORDER — METOCLOPRAMIDE HCL 5 MG PO TABS: 5.0000 mg | ORAL_TABLET | Freq: Three times a day (TID) | ORAL | Status: DC | PRN

## 2015-03-14 MED ORDER — TRAMADOL HCL 50 MG PO TABS
50.0000 mg | ORAL_TABLET | Freq: Four times a day (QID) | ORAL | Status: DC | PRN
  Administered 2015-03-14 – 2015-03-19 (×12): 50 mg via ORAL
  Filled 2015-03-14 (×13): qty 1

## 2015-03-14 MED ORDER — LOSARTAN POTASSIUM 50 MG PO TABS
100.0000 mg | ORAL_TABLET | Freq: Every day | ORAL | Status: DC
  Filled 2015-03-14 (×2): qty 2

## 2015-03-14 MED ORDER — WARFARIN SODIUM 7.5 MG PO TABS
7.5000 mg | ORAL_TABLET | Freq: Once | ORAL | Status: DC
  Filled 2015-03-14: qty 1

## 2015-03-14 MED ORDER — DIPHENHYDRAMINE HCL 25 MG PO CAPS
25.0000 mg | ORAL_CAPSULE | Freq: Three times a day (TID) | ORAL | Status: DC | PRN
  Administered 2015-03-14 – 2015-03-17 (×5): 25 mg via ORAL
  Filled 2015-03-14 (×5): qty 1

## 2015-03-14 MED ORDER — ACETAMINOPHEN 650 MG RE SUPP: 650.0000 mg | Freq: Four times a day (QID) | RECTAL | Status: DC | PRN

## 2015-03-14 MED ORDER — SORBITOL 70 % SOLN: 30.0000 mL | Freq: Every day | Status: DC | PRN

## 2015-03-14 NOTE — Progress Notes (Signed)
   Subjective: 5 Days Post-Op Procedure(s) (LRB): BILATERAL TOTAL KNEE ARTHROPLASTY (Bilateral) Patient reports pain as mild.   Patient seen in rounds with Dr. Wynelle Link. Patient is well, but has had some minor complaints of pain in the knees, requiring pain medications Patient is ready to go to CIR today  Objective: Vital signs in last 24 hours: Temp:  [98.6 F (37 C)-99.4 F (37.4 C)] 98.6 F (37 C) (03/14 0420) Pulse Rate:  [88-97] 88 (03/14 0420) Resp:  [16-20] 16 (03/14 0420) BP: (97-118)/(59-72) 115/66 mmHg (03/14 0420) SpO2:  [100 %] 100 % (03/14 0420)  Intake/Output from previous day:  Intake/Output Summary (Last 24 hours) at 03/14/15 1219 Last data filed at 03/14/15 0456  Gross per 24 hour  Intake    720 ml  Output      0 ml  Net    720 ml     Labs:  Recent Labs  03/12/15 0520  HGB 9.0*    Recent Labs  03/12/15 0520  WBC 7.3  RBC 2.80*  HCT 26.3*  PLT 174   No results for input(s): NA, K, CL, CO2, BUN, CREATININE, GLUCOSE, CALCIUM in the last 72 hours.  Recent Labs  03/13/15 0427 03/14/15 0500  INR 1.01 1.02    EXAM: General - Patient is Alert, Appropriate and Oriented Extremity - Neurovascular intact Sensation intact distally Dorsiflexion/Plantar flexion intact Incision - clean, dry, no drainage, healing Motor Function - intact, moving foot and toes well on exam.   Assessment/Plan: 5 Days Post-Op Procedure(s) (LRB): BILATERAL TOTAL KNEE ARTHROPLASTY (Bilateral) Procedure(s) (LRB): BILATERAL TOTAL KNEE ARTHROPLASTY (Bilateral) Past Medical History  Diagnosis Date  . Hypertension   . Thyroid disease      nodule, hypothyroidism pt was on synthyroid but is not having to take currently pt states her MD states she does not have issues with   . Spinal stenosis     mild hx of herniated disc  . Arthritis     osteoarthritis  . Cancer     melanoma- right upper arm  . Osteomyelitis     left leg  . Renal cyst     benign, found on CT scan,  is being followed  . History of frequent urinary tract infections     on prophalaytic medication    Principal Problem:   OA (osteoarthritis) of knee  Estimated body mass index is 23.17 kg/(m^2) as calculated from the following:   Height as of this encounter: 5\' 7"  (1.702 m).   Weight as of this encounter: 67.132 kg (148 lb). Up with therapy Diet - Cardiac diet Follow up - in 2 weeks Activity - WBAT to both legs Disposition - Rehab Condition Upon Discharge - Good D/C Meds - See DC Summary DVT Prophylaxis - Xarelto  Arlee Muslim, PA-C Orthopaedic Surgery 03/14/2015, 12:19 PM

## 2015-03-14 NOTE — Progress Notes (Signed)
Retta Diones, RN Rehab Admission Coordinator Signed Physical Medicine and Rehabilitation PMR Pre-admission 03/11/2015 12:18 PM  Related encounter: Admission (Discharged) from 03/09/2015 in [place] Collapse All   PMR Admission Coordinator Pre-Admission Assessment  Patient: Tara Stanley is an 59 y.o., female MRN: [id number] DOB: [birth year] Height: 5' 7"  (170.2 cm) Weight: 67.132 kg (148 lb)  Insurance Information HMO: PPO: PCP: IPA: 80/20: OTHER: Choice Plus plan PRIMARY: UHC Policy#: [id number] Subscriber: [name] CM Name: Gaylan Gerold Phone#: [phone] Fax#:  Pre-Cert#: [id number] Employer: Husband works [employer] Benefits: Phone #: [phone] Name: Bobette Mo. Date: 01/01/12 Deduct: $650 (met all) Out of Pocket Max: $2500 ( met $679.79) Life Max: unlimited CIR: 80% w/auth SNF: 80% w/auth 90 days combined CIR/SNF Outpatient: 80% with 60 visits max, has used 1 visit Co-Pay: 20% Home Health: 80% with 60 visits max Co-Pay: 20% DME: 80% Co-Pay: 20% Providers: In network  Emer[facility]    Name Roscoe Spouse [phone]     Current Medical History  Patient Admitting Diagnosis: B TKR  History of Present Illness: A 59 y.o. right handed female with history of hypertension. Patient independent prior to admission living with her husband and active. Presented 03/08/2015 with end-stage osteoarthritis bilateral knees. No change with conservative care including activity modification. Underwent bilateral total knee arthroplasty 03/09/2015 per Dr.Alusio. Hospital course pain management  with epidural removed 03/11/2015. Coumadin for DVT prophylaxis. Weightbearing as tolerated. Acute blood loss anemia 9.0 and monitor. Physical therapy evaluation completed with recommendations of physical medicine rehabilitation consult. M.D. has requested physical medicine rehabilitation consult. Patient to be admitted for comprehensiveinpatient rehabilitation program. : Past Medical History  Past Medical History  Diagnosis Date  . Hypertension   . Thyroid disease     nodule, hypothyroidism pt was on synthyroid but is not having to take currently pt states her MD states she does not have issues with   . Spinal stenosis     mild hx of herniated disc  . Arthritis     osteoarthritis  . Cancer     melanoma- right upper arm  . Osteomyelitis     left leg  . Renal cyst     benign, found on CT scan, is being followed  . History of frequent urinary tract infections     on prophalaytic medication     Family History  family history includes Aneurysm (age of onset: 12) in her maternal aunt; Arthritis in her mother; Diabetes in her mother; Hypertension in her father, mother, and other; Osteoporosis in her maternal grandmother; Stroke in her father, maternal grandmother, and paternal grandfather. There is no history of Colon cancer, Esophageal cancer, Stomach cancer, Rectal cancer, or Thyroid disease.  Prior Rehab/Hospitalizations: None  Current Medications   Current facility-administered medications:  . 0.9 % sodium chloride infusion, , Intravenous, Continuous, Arlee Muslim, PA-C, Last Rate: 10 mL/hr at 03/11/15 1821 . acetaminophen (TYLENOL) tablet 650 mg, 650 mg, Oral, Q6H PRN, 650 mg at 03/10/15 2307 **OR** acetaminophen (TYLENOL) suppository 650 mg, 650 mg, Rectal, Q6H PRN, Gaynelle Arabian, MD . bisacodyl (DULCOLAX) suppository 10 mg, 10 mg, Rectal, Daily PRN, Gaynelle Arabian, MD, 10 mg at 03/11/15 1409 . chlorthalidone (HYGROTON)  tablet 25 mg, 25 mg, Oral, q morning - 10a, Gaynelle Arabian, MD, 25 mg at 03/13/15 0950 . diphenhydrAMINE (BENADRYL) 12.5 MG/5ML elixir 12.5-25 mg, 12.5-25 mg, Oral, Q4H PRN, Gaynelle Arabian,  MD, 12.5 mg at 03/10/15 2213 . docusate sodium (COLACE) capsule 100 mg, 100 mg, Oral, BID, Gaynelle Arabian, MD, 100 mg at 03/14/15 0956 . enoxaparin (LOVENOX) injection 30 mg, 30 mg, Subcutaneous, Q12H, Arlee Muslim, PA-C, 30 mg at 03/14/15 0450 . losartan (COZAAR) tablet 100 mg, 100 mg, Oral, Daily, Gaynelle Arabian, MD, 100 mg at 03/13/15 0950 . menthol-cetylpyridinium (CEPACOL) lozenge 3 mg, 1 lozenge, Oral, PRN **OR** phenol (CHLORASEPTIC) mouth spray 1 spray, 1 spray, Mouth/Throat, PRN, Gaynelle Arabian, MD . methocarbamol (ROBAXIN) tablet 500 mg, 500 mg, Oral, Q6H PRN, 500 mg at 03/13/15 1043 **OR** methocarbamol (ROBAXIN) 500 mg in dextrose 5 % 50 mL IVPB, 500 mg, Intravenous, Q6H PRN, Gaynelle Arabian, MD, 500 mg at 03/10/15 1519 . metoCLOPramide (REGLAN) tablet 5-10 mg, 5-10 mg, Oral, Q8H PRN **OR** metoCLOPramide (REGLAN) injection 5-10 mg, 5-10 mg, Intravenous, Q8H PRN, Gaynelle Arabian, MD . morphine 2 MG/ML injection 1-2 mg, 1-2 mg, Intravenous, Q1H PRN, Gaynelle Arabian, MD, 2 mg at 03/11/15 1660 . nitrofurantoin (MACRODANTIN) capsule 50 mg, 50 mg, Oral, QHS, Gaynelle Arabian, MD, 50 mg at 03/13/15 2118 . ondansetron (ZOFRAN) tablet 4 mg, 4 mg, Oral, Q6H PRN **OR** ondansetron (ZOFRAN) injection 4 mg, 4 mg, Intravenous, Q6H PRN, Gaynelle Arabian, MD, 4 mg at 03/11/15 1138 . oxyCODONE (Oxy IR/ROXICODONE) immediate release tablet 5-15 mg, 5-15 mg, Oral, Q3H PRN, Gaynelle Arabian, MD, 5 mg at 03/14/15 0728 . polyethylene glycol (MIRALAX / GLYCOLAX) packet 17 g, 17 g, Oral, Daily PRN, Gaynelle Arabian, MD . traMADol Veatrice Bourbon) tablet 50-100 mg, 50-100 mg, Oral, Q6H PRN, Gaynelle Arabian, MD, 50 mg at 03/14/15 0430 . warfarin (COUMADIN) video, , Does not apply, Once, [employer], RPH . Warfarin - Pharmacist Dosing  Inpatient, , Does not apply, q1800, Minda Ditto, RPH, 0 at 03/13/15 1800  Patients Current Diet: Diet regular  Precautions / Restrictions Precautions Precautions: Knee, Fall Restrictions Weight Bearing Restrictions: No Other Position/Activity Restrictions: WBAT   Prior Activity Level Community (5-7x/wk): Went out daily. volunteers regularly. Not working.   Home Assistive Devices / Equipment Home Assistive Devices/Equipment: Eyeglasses Home Equipment: None  Prior Functional Level Prior Function Level of Independence: Independent  Current Functional Level Cognition  Overall Cognitive Status: Within Functional Limits for tasks assessed Orientation Level: Oriented X4   Extremity Assessment (includes Sensation/Coordination)  Upper Extremity Assessment: Overall WFL for tasks assessed  Lower Extremity Assessment: RLE deficits/detail, LLE deficits/detail RLE Deficits / Details: 2+/5 quads with AAROM at knee -10 - 90 LLE Deficits / Details: 2-/5 quads with AAROM at knee -10 - 90 - pt with min LE control 2* effect of epidural    ADLs  Overall ADL's : Needs assistance/impaired Grooming: Set up, Sitting Upper Body Bathing: Set up, Sitting Lower Body Bathing: Maximal assistance, +2 for physical assistance, Sit to/from stand Upper Body Dressing : Minimal assistance, Sitting Lower Body Dressing: Total assistance, +2 for physical assistance, Sit to/from stand Toilet Transfer: Moderate assistance, +2 for physical assistance, Stand-pivot (to recliner) Toileting- Clothing Manipulation and Hygiene: Total assistance, +2 for physical assistance, Sit to/from stand General ADL Comments: pt became nauseas and dizzy when sitting EOB--RN brought pain medication. Pt limited with ADLS because of this. Did not introduce AE at this time. Pt is unable to lift legs independently--could use reacher with washcloth for bathing    Mobility  Overal bed mobility: Needs Assistance, +2 for  physical assistance, + 2 for safety/equipment Bed Mobility: Supine to Sit Supine to sit: Min assist Sit to supine: Min assist,  Mod assist, +2 for physical assistance, +2 for safety/equipment General bed mobility comments: cues for sequence with assist to manage bil LEs     Transfers  Overall transfer level: Needs assistance Equipment used: Rolling walker (2 wheeled) Transfers: Sit to/from Stand Sit to Stand: From elevated surface, Min assist, Mod assist, +2 safety/equipment Stand pivot transfers: +2 physical assistance, +2 safety/equipment, Min assist, Mod assist General transfer comment: cues for use of UEs to self assist and for LE management; Physical assist for support, balance, RW management    Ambulation / Gait / Stairs / Wheelchair Mobility  Ambulation/Gait Ambulation/Gait assistance: Min assist Ambulation Distance (Feet): 24 Feet Assistive device: Rolling walker (2 wheeled) Gait Pattern/deviations: Step-to pattern, Decreased step length - right, Decreased step length - left, Shuffle, Trunk flexed Gait velocity: decr General Gait Details: cues for posture, position from RW and sequence    Posture / Balance      Special needs/care consideration BiPAP/CPAP No CPM Yes Continuous Drip IV No Dialysis No  Life Vest No Oxygen No Special Bed No Trach Size No Wound Vac (area) No  Skin Has B TKR incisions with dressings  Bowel mgmt: Last BM 03/12/15 Bladder mgmt: Voiding WDL Diabetic mgmt No    Previous Home Environment Living Arrangements: Spouse/significant other Available Help at Discharge: Family Type of Home: House Home Layout: Two level, Able to live on main level with bedroom/bathroom Bathroom Shower/Tub: Multimedia programmer: Standard Bathroom Accessibility: Yes Home Care Services: No  Discharge Living Setting Plans for Discharge Living Setting: Patient's home, House, Lives with (comment) (Lives  with husband.) Type of Home at Discharge: House Discharge Home Layout: Two level, Able to live on main level with bedroom/bathroom Alternate Level Stairs-Number of Steps: 15-18 steps Discharge Home Access: Level entry Entrance Stairs-Number of Steps: very small lip at entrance, basically level entry. Discharge Bathroom Shower/Tub: Walk-in shower Discharge Bathroom Toilet: Standard Does the patient have any problems obtaining your medications?: No  Social/Family/Support Systems Patient Roles: Spouse, Parent (Has a husband and 2 daughters, in [place] and [place].) Contact Information: Aeon Koors - husband Anticipated Caregiver: self, spouse and may hire NA help. Anticipated Caregiver's Contact Information: Randall Hiss - spouse (c) [phone] Ability/Limitations of Caregiver: Husband works and has to travel out of town for his job. Caregiver Availability: Intermittent (May hire NA help through an agency.) Discharge Plan Discussed with Primary Caregiver: Yes Is Caregiver In Agreement with Plan?: Yes Does Caregiver/Family have Issues with Lodging/Transportation while Pt is in Rehab?: No  Goals/Additional Needs Patient/Family Goal for Rehab: PT/OT mod I goals Expected length of stay: 7 days Cultural Considerations: None Dietary Needs: Regular, thin liquids Equipment Needs: TBD Pt/Family Agrees to Admission and willing to participate: Yes Program Orientation Provided & Reviewed with Pt/Caregiver Including Roles & Responsibilities: Yes  Decrease burden of Care through IP rehab admission: N/A  Possible need for SNF placement upon discharge: Not planned  Patient Condition: This patient's medical and functional status has changed since the consult dated: 03/10/15 in which the Rehabilitation Physician determined and documented that the patient's condition is appropriate for intensive rehabilitative care in an inpatient rehabilitation facility. See "History of Present Illness" (above) for medical  update. Functional changes are: Currently requiring min/mod assist for transfers and ambulated min assist 24 ft RW. Patient's medical and functional status update has been discussed with the Rehabilitation physician and patient remains appropriate for inpatient rehabilitation. Will admit to inpatient rehab today.  Preadmission Screen Completed By: Retta Diones, 03/14/2015 9:58 AM ______________________________________________________________________  Discussed status with Dr. Naaman Plummer on 03/14/15 at [phone] and received telephone approval for admission today.  Admission Coordinator: Retta Diones, time0958/Date03/14/16          Cosigned by: Meredith Staggers, MD at 03/14/2015 10:08 AM  Revision History     Date/Time User Provider Type Action   03/14/2015 10:08 AM Meredith Staggers, MD Physician Cosign   03/14/2015 9:58 AM Retta Diones, RN Rehab Admission Coordinator Sign

## 2015-03-14 NOTE — Discharge Summary (Signed)
Physician Discharge Summary   Patient ID: Tara Stanley MRN: [id number] DOB/AGE: Nov 28, 1956 59 y.o.  Admit date: 03/09/2015 Discharge date: 03-14-2015  Primary Diagnosis:  Osteoarthritis Bilateral knee(s)  Admission Diagnoses:  Past Medical History  Diagnosis Date  . Hypertension   . Thyroid disease      nodule, hypothyroidism pt was on synthyroid but is not having to take currently pt states her MD states she does not have issues with   . Spinal stenosis     mild hx of herniated disc  . Arthritis     osteoarthritis  . Cancer     melanoma- right upper arm  . Osteomyelitis     left leg  . Renal cyst     benign, found on CT scan, is being followed  . History of frequent urinary tract infections     on prophalaytic medication    Discharge Diagnoses:   Principal Problem:   OA (osteoarthritis) of knee  Estimated body mass index is 23.17 kg/(m^2) as calculated from the following:   Height as of this encounter: 5' 7" (1.702 m).   Weight as of this encounter: 67.132 kg (148 lb).  Procedure:  Procedure(s) (LRB): BILATERAL TOTAL KNEE ARTHROPLASTY (Bilateral)   Consults: [facility]  HPI: Tara Stanley is a 59 y.o. year old female with end stage OA of both knees with progressively worsening pain and dysfunction. He has constant pain, with activity and at rest and significant functional deficits with difficulties even with ADLs. She has had extensive non-op management including analgesics, injections of cortisone and viscosupplements, and home exercise program, but remains in significant pain with significant dysfunction. We discussed replacing both knees in the same setting versus one at a time including procedure, risks, potential complications, rehab course, and pros and cons associated with each and the patient elects to do both knees at the same time. She presents now for Bilateral Total Knee Arthroplasty.   Laboratory Data: Admission on 03/09/2015  Component Date Value  Ref Range Status  . ABO/RH(D) 03/09/2015 O NEG   Final  . Antibody Screen 03/09/2015 NEG   Final  . Sample Expiration 03/09/2015 03/12/2015   Final  . ABO/RH(D) 03/09/2015 O NEG   Final  . WBC 03/10/2015 8.4  4.0 - 10.5 K/uL Final  . RBC 03/10/2015 3.29* 3.87 - 5.11 MIL/uL Final  . Hemoglobin 03/10/2015 10.5* 12.0 - 15.0 g/dL Final  . HCT 03/10/2015 30.5* 36.0 - 46.0 % Final  . MCV 03/10/2015 92.7  78.0 - 100.0 fL Final  . MCH 03/10/2015 31.9  26.0 - 34.0 pg Final  . MCHC 03/10/2015 34.4  30.0 - 36.0 g/dL Final  . RDW 03/10/2015 12.2  11.5 - 15.5 % Final  . Platelets 03/10/2015 215  150 - 400 K/uL Final  . Sodium 03/10/2015 138  135 - 145 mmol/L Final  . Potassium 03/10/2015 3.4* 3.5 - 5.1 mmol/L Final  . Chloride 03/10/2015 105  96 - 112 mmol/L Final  . CO2 03/10/2015 25  19 - 32 mmol/L Final  . Glucose, Bld 03/10/2015 144* 70 - 99 mg/dL Final  . BUN 03/10/2015 11  6 - 23 mg/dL Final  . Creatinine, Ser 03/10/2015 0.52  0.50 - 1.10 mg/dL Final  . Calcium 03/10/2015 8.5  8.4 - 10.5 mg/dL Final  . GFR calc non Af Amer 03/10/2015 >90  >90 mL/min Final  . GFR calc Af Amer 03/10/2015 >90  >90 mL/min Final   Comment: (NOTE) The eGFR has been calculated using  the CKD EPI equation. This calculation has not been validated in all clinical situations. eGFR's persistently <90 mL/min signify possible Chronic Kidney Disease.   . Anion gap 03/10/2015 8  5 - 15 Final  . Prothrombin Time 03/10/2015 12.9  11.6 - 15.2 seconds Final  . INR 03/10/2015 0.96  0.00 - 1.49 Final  . WBC 03/11/2015 7.8  4.0 - 10.5 K/uL Final  . RBC 03/11/2015 2.84* 3.87 - 5.11 MIL/uL Final  . Hemoglobin 03/11/2015 9.1* 12.0 - 15.0 g/dL Final  . HCT 03/11/2015 26.7* 36.0 - 46.0 % Final  . MCV 03/11/2015 94.0  78.0 - 100.0 fL Final  . MCH 03/11/2015 32.0  26.0 - 34.0 pg Final  . MCHC 03/11/2015 34.1  30.0 - 36.0 g/dL Final  . RDW 03/11/2015 12.4  11.5 - 15.5 % Final  . Platelets 03/11/2015 185  150 - 400 K/uL Final  .  Sodium 03/11/2015 141  135 - 145 mmol/L Final  . Potassium 03/11/2015 3.4* 3.5 - 5.1 mmol/L Final  . Chloride 03/11/2015 107  96 - 112 mmol/L Final  . CO2 03/11/2015 26  19 - 32 mmol/L Final  . Glucose, Bld 03/11/2015 94  70 - 99 mg/dL Final  . BUN 03/11/2015 12  6 - 23 mg/dL Final  . Creatinine, Ser 03/11/2015 0.48* 0.50 - 1.10 mg/dL Final  . Calcium 03/11/2015 8.6  8.4 - 10.5 mg/dL Final  . GFR calc non Af Amer 03/11/2015 >90  >90 mL/min Final  . GFR calc Af Amer 03/11/2015 >90  >90 mL/min Final   Comment: (NOTE) The eGFR has been calculated using the CKD EPI equation. This calculation has not been validated in all clinical situations. eGFR's persistently <90 mL/min signify possible Chronic Kidney Disease.   . Anion gap 03/11/2015 8  5 - 15 Final  . Prothrombin Time 03/11/2015 12.6  11.6 - 15.2 seconds Final  . INR 03/11/2015 0.94  0.00 - 1.49 Final  . WBC 03/12/2015 7.3  4.0 - 10.5 K/uL Final  . RBC 03/12/2015 2.80* 3.87 - 5.11 MIL/uL Final  . Hemoglobin 03/12/2015 9.0* 12.0 - 15.0 g/dL Final  . HCT 03/12/2015 26.3* 36.0 - 46.0 % Final  . MCV 03/12/2015 93.9  78.0 - 100.0 fL Final  . MCH 03/12/2015 32.1  26.0 - 34.0 pg Final  . MCHC 03/12/2015 34.2  30.0 - 36.0 g/dL Final  . RDW 03/12/2015 12.3  11.5 - 15.5 % Final  . Platelets 03/12/2015 174  150 - 400 K/uL Final  . Prothrombin Time 03/12/2015 12.8  11.6 - 15.2 seconds Final  . INR 03/12/2015 0.95  0.00 - 1.49 Final  . Prothrombin Time 03/13/2015 13.4  11.6 - 15.2 seconds Final  . INR 03/13/2015 1.01  0.00 - 1.49 Final  . Prothrombin Time 03/14/2015 13.5  11.6 - 15.2 seconds Final  . INR 03/14/2015 1.02  0.00 - 1.49 Final  Hospital Outpatient Visit on 02/28/2015  Component Date Value Ref Range Status  . MRSA, PCR 02/28/2015 POSITIVE* NEGATIVE Final   Comment: RESULT CALLED TO, READ BACK BY AND VERIFIED WITH: STANLEY,K @ 1405 ON [id number] BY POTEAT,S   . Staphylococcus aureus 02/28/2015 POSITIVE* NEGATIVE Final   Comment:         The Xpert SA Assay (FDA approved for NASAL specimens in patients over 68 years of age), is one component of a comprehensive surveillance program.  Test performance has been validated by [facility] for patients greater than or equal to 32 year old. It  is not intended to diagnose infection nor to guide or monitor treatment.   Marland Kitchen aPTT 02/28/2015 27  24 - 37 seconds Final  . WBC 02/28/2015 7.3  4.0 - 10.5 K/uL Final  . RBC 02/28/2015 4.58  3.87 - 5.11 MIL/uL Final  . Hemoglobin 02/28/2015 14.4  12.0 - 15.0 g/dL Final  . HCT 02/28/2015 43.8  36.0 - 46.0 % Final  . MCV 02/28/2015 95.6  78.0 - 100.0 fL Final  . MCH 02/28/2015 31.4  26.0 - 34.0 pg Final  . MCHC 02/28/2015 32.9  30.0 - 36.0 g/dL Final  . RDW 02/28/2015 12.6  11.5 - 15.5 % Final  . Platelets 02/28/2015 269  150 - 400 K/uL Final  . Sodium 02/28/2015 138  135 - 145 mmol/L Final  . Potassium 02/28/2015 4.3  3.5 - 5.1 mmol/L Final  . Chloride 02/28/2015 99  96 - 112 mmol/L Final  . CO2 02/28/2015 28  19 - 32 mmol/L Final  . Glucose, Bld 02/28/2015 107* 70 - 99 mg/dL Final  . BUN 02/28/2015 20  6 - 23 mg/dL Final  . Creatinine, Ser 02/28/2015 0.53  0.50 - 1.10 mg/dL Final  . Calcium 02/28/2015 9.9  8.4 - 10.5 mg/dL Final  . Total Protein 02/28/2015 7.8  6.0 - 8.3 g/dL Final  . Albumin 02/28/2015 5.1  3.5 - 5.2 g/dL Final  . AST 02/28/2015 25  0 - 37 U/L Final  . ALT 02/28/2015 22  0 - 35 U/L Final  . Alkaline Phosphatase 02/28/2015 61  39 - 117 U/L Final  . Total Bilirubin 02/28/2015 1.3* 0.3 - 1.2 mg/dL Final  . GFR calc non Af Amer 02/28/2015 >90  >90 mL/min Final  . GFR calc Af Amer 02/28/2015 >90  >90 mL/min Final   Comment: (NOTE) The eGFR has been calculated using the CKD EPI equation. This calculation has not been validated in all clinical situations. eGFR's persistently <90 mL/min signify possible Chronic Kidney Disease.   . Anion gap 02/28/2015 11  5 - 15 Final  . Prothrombin Time 02/28/2015 12.7  11.6 -  15.2 seconds Final  . INR 02/28/2015 0.94  0.00 - 1.49 Final  . Color, Urine 02/28/2015 YELLOW  YELLOW Final  . APPearance 02/28/2015 CLEAR  CLEAR Final  . Specific Gravity, Urine 02/28/2015 1.007  1.005 - 1.030 Final  . pH 02/28/2015 7.0  5.0 - 8.0 Final  . Glucose, UA 02/28/2015 NEGATIVE  NEGATIVE mg/dL Final  . Hgb urine dipstick 02/28/2015 SMALL* NEGATIVE Final  . Bilirubin Urine 02/28/2015 NEGATIVE  NEGATIVE Final  . Ketones, ur 02/28/2015 NEGATIVE  NEGATIVE mg/dL Final  . Protein, ur 02/28/2015 NEGATIVE  NEGATIVE mg/dL Final  . Urobilinogen, UA 02/28/2015 0.2  0.0 - 1.0 mg/dL Final  . Nitrite 02/28/2015 NEGATIVE  NEGATIVE Final  . Leukocytes, UA 02/28/2015 NEGATIVE  NEGATIVE Final  . Squamous Epithelial / LPF 02/28/2015 RARE  RARE Final  . RBC / HPF 02/28/2015 0-2  <3 RBC/hpf Final     X-Rays:No results found.  EKG: Orders placed or performed in visit on 01/24/15  . EKG 12-Lead     Hospital Course: Patient was admitted to [facility] and taken to the OR and underwent the above stated procedure well without complications.  Patient tolerated the procedure well and was later transferred to the recovery room and then to the orthopaedic floor for postoperative care. Anesthesia was consulted postoperatively to place an epidural in for postoperative pain management. The patient was also given  PO and IV analgesics for pain control following their surgery.  They were given 24 hours of postoperative antibiotics and started on DVT prophylaxis in the form of Lovenox and Coumadin after the epidural had been removed.  Consult was called for an inpatient rehab consult.  PT and OT were ordered for total joint protocol.  Discharge planning consulted to help with postop disposition and equipment needs.  Patient had a decent night on the evening of surgery and started to get up OOB with therapy on day one.  Hemovac drains were pulled without difficulty on day one. Epidural slipped out later that  night night and had moderate to severe increase in pain.  Given IV meds and encouraged POs. Continued to work with therapy into day two.  Dressings were changed on day two and both incisions were healing well.  The epidural was removed without difficulty by Anesthesia on day two.  By day three, the patient started to show progress with therapy and feeling much better.  They continued to receive therapy each day for continued total knee protocol.  The incisions were healing well.  They continued to progress on day four and day five at which time the patient was seen in rounds and was ready to go to CIR.  The patient was not close to being therapeutic on her coumadin so she was changed over to Xarelto prior to being transferred over to CIR.  Take Xarelto for three and a half more weeks, then discontinue Xarelto. Once the patient has completed the blood thinner regimen, then take a Baby 81 mg Aspirin daily for three more weeks.  Diet - Cardiac diet Follow up - in 2 weeks Activity - WBAT to both legs Disposition - Rehab Condition Upon Discharge - Good D/C Meds - See DC Summary DVT Prophylaxis - Xarelto      Discharge Instructions    Call MD / Call 911    Complete by:  As directed   If you experience chest pain or shortness of breath, CALL 911 and be transported to the hospital emergency room.  If you develope a fever above 101 F, pus (white drainage) or increased drainage or redness at the wound, or calf pain, call your surgeon's office.     Change dressing    Complete by:  As directed   Change dressing daily with sterile 4 x 4 inch gauze dressing and apply TED hose. Do not submerge the incision under water.     Constipation Prevention    Complete by:  As directed   Drink plenty of fluids.  Prune juice may be helpful.  You may use a stool softener, such as Colace (over the counter) 100 mg twice a day.  Use MiraLax (over the counter) for constipation as needed.     Diet - low sodium heart healthy     Complete by:  As directed      Discharge instructions    Complete by:  As directed   Pick up stool softner and laxative for home use following surgery while on pain medications. Do not submerge incision under water. Please use good hand washing techniques while changing dressing each day. May shower starting three days after surgery. Please use a clean towel to pat the incision dry following showers. Continue to use ice for pain and swelling after surgery. Do not use any lotions or creams on the incision until instructed by your surgeon. Take Xarelto for three and a half more weeks, then discontinue Xarelto.  Once the patient has completed the blood thinner regimen, then take a Baby 81 mg Aspirin daily for three more weeks.  Postoperative Constipation Protocol  Constipation - defined medically as fewer than three stools per week and severe constipation as less than one stool per week.  One of the most common issues patients have following surgery is constipation.  Even if you have a regular bowel pattern at home, your normal regimen is likely to be disrupted due to multiple reasons following surgery.  Combination of anesthesia, postoperative narcotics, change in appetite and fluid intake all can affect your bowels.  In order to avoid complications following surgery, here are some recommendations in order to help you during your recovery period.  Colace (docusate) - Pick up an over-the-counter form of Colace or another stool softener and take twice a day as long as you are requiring postoperative pain medications.  Take with a full glass of water daily.  If you experience loose stools or diarrhea, hold the colace until you stool forms back up.  If your symptoms do not get better within 1 week or if they get worse, check with your doctor.  Dulcolax (bisacodyl) - Pick up over-the-counter and take as directed by the product packaging as needed to assist with the movement of your bowels.  Take with  a full glass of water.  Use this product as needed if not relieved by Colace only.   MiraLax (polyethylene glycol) - Pick up over-the-counter to have on hand.  MiraLax is a solution that will increase the amount of water in your bowels to assist with bowel movements.  Take as directed and can mix with a glass of water, juice, soda, coffee, or tea.  Take if you go more than two days without a movement. Do not use MiraLax more than once per day. Call your doctor if you are still constipated or irregular after using this medication for 7 days in a row.  If you continue to have problems with postoperative constipation, please contact the office for further assistance and recommendations.  If you experience "the worst abdominal pain ever" or develop nausea or vomiting, please contact the office immediatly for further recommendations for treatment.  When discharged from the skilled rehab facility, please have the facility set up the patient's [place] prior to being released.   Also provide the patient with their medications at time of release from the facility to include their pain medication, the muscle relaxants, and their blood thinner medication.  If the patient is still at the rehab facility at time of follow up appointment, please also assist the patient in arranging follow up appointment in our office and any transportation needs. ICE to the affected knee or hip every three hours for 30 minutes at a time and then as needed for pain and swelling.     Do not put a pillow under the knee. Place it under the heel.    Complete by:  As directed      Do not sit on low chairs, stoools or toilet seats, as it may be difficult to get up from low surfaces    Complete by:  As directed      Driving restrictions    Complete by:  As directed   No driving until released by the physician.     Increase activity slowly as tolerated    Complete by:  As directed      Lifting restrictions     Complete  by:  As directed   No lifting until released by the physician.     Patient may shower    Complete by:  As directed   You may shower without a dressing once there is no drainage.  Do not wash over the wound.  If drainage remains, do not shower until drainage stops.     TED hose    Complete by:  As directed   Use stockings (TED hose) for 3 weeks on both leg(s).  You may remove them at night for sleeping.     Weight bearing as tolerated    Complete by:  As directed   Laterality:  bilateral  Extremity:  Lower            Medication List    STOP taking these medications        Biotin 1 MG Caps     BLACK COHOSH PO     cephALEXin 500 MG capsule  Commonly known as:  KEFLEX     CRANBERRY PO     estradiol 2 MG vaginal ring  Commonly known as:  ESTRING     meloxicam 7.5 MG tablet  Commonly known as:  MOBIC     multivitamin tablet      TAKE these medications        acetaminophen 325 MG tablet  Commonly known as:  TYLENOL  Take 2 tablets (650 mg total) by mouth every 6 (six) hours as needed for mild pain (or Fever >/= 101).     bisacodyl 10 MG suppository  Commonly known as:  DULCOLAX  Place 1 suppository (10 mg total) rectally daily as needed for moderate constipation.     chlorthalidone 25 MG tablet  Commonly known as:  HYGROTON  Take 25 mg by mouth every morning.     docusate sodium 100 MG capsule  Commonly known as:  COLACE  Take 1 capsule (100 mg total) by mouth 2 (two) times daily.     losartan 100 MG tablet  Commonly known as:  COZAAR  TAKE 1 TABLET DAILY     methocarbamol 500 MG tablet  Commonly known as:  ROBAXIN  Take 1 tablet (500 mg total) by mouth every 6 (six) hours as needed for muscle spasms.     metoCLOPramide 5 MG tablet  Commonly known as:  REGLAN  Take 1-2 tablets (5-10 mg total) by mouth every 8 (eight) hours as needed for nausea (if ondansetron (ZOFRAN) ineffective.).     nitrofurantoin 50 MG capsule  Commonly known as:   MACRODANTIN  Take 50 mg by mouth at bedtime.     ondansetron 4 MG tablet  Commonly known as:  ZOFRAN  Take 1 tablet (4 mg total) by mouth every 6 (six) hours as needed for nausea.     oxyCODONE 5 MG immediate release tablet  Commonly known as:  Oxy IR/ROXICODONE  Take 1-3 tablets (5-15 mg total) by mouth every 3 (three) hours as needed for moderate pain, severe pain or breakthrough pain.     polyethylene glycol packet  Commonly known as:  MIRALAX / GLYCOLAX  Take 17 g by mouth daily as needed for mild constipation.     rivaroxaban 10 MG Tabs tablet  Commonly known as:  XARELTO  - Take 1 tablet (10 mg total) by mouth daily. Take Xarelto for three and a half more weeks, then discontinue Xarelto.  - Once the patient has completed the blood thinner regimen, then take a Baby 81 mg Aspirin daily  for three more weeks.     traMADol 50 MG tablet  Commonly known as:  ULTRAM  Take 1-2 tablets (50-100 mg total) by mouth every 6 (six) hours as needed (mild pain).       Follow-up Information    Follow up with Gearlean Alf, MD. Schedule an appointment as soon as possible for a visit on 03/24/2015.   Specialty:  Orthopedic Surgery   Why:  Call office for appointment at [phone] with Dr. Denman George information:   [address] [place] [address] [phone]       Signed: Arlee Muslim, PA-C Orthopaedic Surgery 03/14/2015, 12:28 PM

## 2015-03-14 NOTE — Progress Notes (Signed)
Rehab admissions - I have approval from [facility] insurance for acute inpatient rehab admission today.  Bed available and will admit to acute inpatient rehab today.  Call me for questions.  #[phone]

## 2015-03-14 NOTE — Progress Notes (Signed)
Patient prefers Ultram in place of oxycodone. Orders have been provided.

## 2015-03-14 NOTE — Progress Notes (Signed)
Tara Blake, MD Physician Signed Physical Medicine and Rehabilitation Consult Note 03/10/2015 5:42 AM  Related encounter: Admission (Discharged) from 03/09/2015 in [facility]        Physical Medicine and Rehabilitation Consult Reason for Consult: Bilateral total knee arthroplasty secondary to end-stage osteoarthritis Referring Physician: Joette Catching   HPI: Tara Stanley is a 59 y.o. right handed female with history of hypertension. Patient independent prior to admission living with her husband. Presented 03/08/2015 with end-stage osteoarthritis bilateral knees. No change with conservative care including activity modification. Underwent bilateral total knee arthroplasty 03/09/2015 per Dr.Alusio. Hospital course pain management. Coumadin for DVT prophylaxis. Weightbearing as tolerated. Physical and occupational therapy evaluations are pending. M.D. has requested physical medicine rehabilitation consult.  Pt states she has been "pre habbing " in gym for several months prior to surgery Left LE more numb than right with epidural Plans to have epidural removed in am Tolerating po meds and meals , has foley Review of Systems  Gastrointestinal: Positive for constipation.  Musculoskeletal: Positive for myalgias and joint pain.  All other systems reviewed and are negative.  Past Medical History  Diagnosis Date  . Hypertension   . Thyroid disease     nodule, hypothyroidism pt was on synthyroid but is not having to take currently pt states her MD states she does not have issues with   . Spinal stenosis     mild hx of herniated disc  . Arthritis     osteoarthritis  . Cancer     melanoma- right upper arm  . Osteomyelitis     left leg  . Renal cyst     benign, found on CT scan, is being followed  . History of frequent urinary tract infections     on prophalaytic medication      Past Surgical History  Procedure Laterality Date  . Appendectomy  03/2011    laparoscopic  . Repair of rt med meniscal tear    . Skin cancer excision      right upper arm  . Left tibia      for osteomelitis of left tibia at age 53  . Colonscopy      Family History  Problem Relation Age of Onset  . Colon cancer Neg Hx   . Esophageal cancer Neg Hx   . Stomach cancer Neg Hx   . Rectal cancer Neg Hx   . Thyroid disease Neg Hx   . Diabetes Mother     borderline type II  . Hypertension Mother   . Arthritis Mother   . Hypertension Father   . Stroke Father   . Hypertension Other     all grandparents  . Stroke Maternal Grandmother   . Osteoporosis Maternal Grandmother   . Stroke Paternal Grandfather   . Aneurysm Maternal Aunt 63   Social History:  reports that she quit smoking about 21 years ago. Her smoking use included Cigarettes. She has a 40 pack-year smoking history. She has never used smokeless tobacco. She reports that she drinks about 1.2 oz of alcohol per week. She reports that she uses illicit drugs. Allergies:  Allergies  Allergen Reactions  . Hydrocodone Other (See Comments)    Makes me goofy   Medications Prior to Admission  Medication Sig Dispense Refill  . Biotin 1 MG CAPS Take 1 tablet by mouth every morning.    Marland Kitchen BLACK COHOSH PO Take 300 mg by mouth every morning.     Marland Kitchen  chlorthalidone (HYGROTON) 25 MG tablet Take 25 mg by mouth every morning.     Marland Kitchen CRANBERRY PO Take 465 mg by mouth every morning.    Marland Kitchen estradiol (ESTRING) 2 MG vaginal ring Place 2 mg vaginally every 3 (three) months. 1 each 3  . losartan (COZAAR) 100 MG tablet TAKE 1 TABLET DAILY 90 tablet 3  . meloxicam (MOBIC) 7.5 MG tablet Take 15 mg by mouth every morning.   5  . Multiple Vitamin (MULTIVITAMIN) tablet Take 1 tablet by mouth every  morning.     . nitrofurantoin (MACRODANTIN) 50 MG capsule Take 50 mg by mouth at bedtime.    . cephALEXin (KEFLEX) 500 MG capsule Take 1 capsule (500 mg total) by mouth 4 (four) times daily. Take all of medicine and drink lots of fluids (Patient not taking: Reported on 02/18/2015) 20 capsule 0    Home: Home Living Family/patient expects to be discharged to:: Private residence Living Arrangements: Spouse/significant other Available Help at Discharge: Family Type of Home: House Home Layout: Two level, Able to live on main level with bedroom/bathroom Home Equipment: None  Functional History:   Functional Status:  Mobility:          ADL:    Cognition: Cognition Orientation Level: Oriented X4    Blood pressure 119/75, pulse 72, temperature 98.2 F (36.8 C), temperature source Oral, resp. rate 12, height 5\' 7"  (1.702 m), weight 67.132 kg (148 lb), last menstrual period 01/01/2008, SpO2 100 %. Physical Exam  Constitutional: She is oriented to person, place, and time. She appears well-developed.  HENT:  Head: Normocephalic.  Eyes: EOM are normal.  Neck: Normal range of motion. Neck supple. No thyromegaly present.  Cardiovascular: Normal rate and regular rhythm.  Respiratory: Effort normal and breath sounds normal. No respiratory distress.  GI: Soft. Bowel sounds are normal. She exhibits no distension.  Neurological: She is alert and oriented to person, place, and time.  Skin:  Bilateral knee incisions are dressed appropriately tender  Motor: 5/5 in Bilateral delt, bi, tri, grip Right 3-/5 HF, KE, 4/5 , ankle is 4/5 Left 2- HF, 3- KE 3- ADF Sensation parasthesias with LT in bilateral feet and up to Left thigh   Lab Results Last 24 Hours    Results for orders placed or performed during the hospital encounter of 03/09/15 (from the past 24 hour(s))  ABO/Rh Status: None   Collection Time: 03/09/15 9:00 AM  Result Value Ref Range   ABO/RH(D) O  NEG   Type and screen Status: None   Collection Time: 03/09/15 9:10 AM  Result Value Ref Range   ABO/RH(D) O NEG    Antibody Screen NEG    Sample Expiration 03/12/2015       Imaging Results (Last 48 hours)    No results found.    Assessment/Plan: Diagnosis: Bilateral end stage osteoarthritis of the knees POD #1 post B TKR 1. Does the need for close, 24 hr/day medical supervision in concert with the patient's rehab needs make it unreasonable for this patient to be served in a less intensive setting? Yes 2. Co-Morbidities requiring supervision/potential complications: HTN,lumbar spinal stenosis 3. Due to bladder management, bowel management, safety, skin/wound care, disease management, medication administration, pain management and patient education, does the patient require 24 hr/day rehab nursing? Yes 4. Does the patient require coordinated care of a physician, rehab nurse, PT (1-2 hrs/day, 5 days/week) and OT (1-2 hrs/day, 5 days/week) to address physical and functional deficits in the context of the above  medical diagnosis(es)? Yes Addressing deficits in the following areas: balance, endurance, locomotion, strength, transferring, bowel/bladder control, bathing, dressing, feeding, grooming and toileting 5. Can the patient actively participate in an intensive therapy program of at least 3 hrs of therapy per day at least 5 days per week? Yes and after Epidural out 6. The potential for patient to make measurable gains while on inpatient rehab is excellent 7. Anticipated functional outcomes upon discharge from inpatient rehab are modified independent with PT, modified independent with OT, n/a with SLP. 8. Estimated rehab length of stay to reach the above functional goals is: 7d 9. Does the patient have adequate social supports and living environment to accommodate these discharge functional goals? Yes 10. Anticipated D/C setting: Home 11. Anticipated post D/C  treatments: [facility] 12. Overall Rehab/Functional Prognosis: excellent  RECOMMENDATIONS: This patient's condition is appropriate for continued rehabilitative care in the following setting: CIR if still requiring at least Min A after epidural is out POD#2 Patient has agreed to participate in recommended program. Yes Note that insurance prior authorization may be required for reimbursement for recommended care.  Comment:     03/10/2015       Revision History     Date/Time User Provider Type Action   03/10/2015 10:10 AM Tara Blake, MD Physician Sign   03/10/2015 5:47 AM Cathlyn Parsons, PA-C Physician Assistant [place]   View Details Report       Routing History     Date/Time From To Method   03/10/2015 10:10 AM Tara Blake, MD Tara Blake, MD In Basket   03/10/2015 10:10 AM Tara Blake, MD Elby Showers, MD In [place]

## 2015-03-14 NOTE — H&P (Signed)
Physical Medicine and Rehabilitation Admission H&P   Chief complaint: Knee tightness  HPI: Tara Stanley is a 59 y.o. right handed female with history of hypertension. Patient independent prior to admission living with her husband and active. Presented 03/08/2015 with end-stage osteoarthritis bilateral knees. No change with conservative care including activity modification. Underwent bilateral total knee arthroplasty 03/09/2015 per Dr.Alusio. Hospital course pain management with epidural removed 03/11/2015. Coumadin for DVT prophylaxis. Weightbearing as tolerated. Acute blood loss anemia 9.0 and monitor. MRSA PCR screen positive maintained on contact precautions. Physical therapy evaluation completed with recommendations of physical medicine rehabilitation consult. M.D. has requested physical medicine rehabilitation consult. Patient was admitted for comprehensive rehabilitation program   ROS Review of Systems  Gastrointestinal: Positive for constipation.  Musculoskeletal: Positive for myalgias and joint pain.  All other systems reviewed and are negative   Past Medical History  Diagnosis Date  . Hypertension   . Thyroid disease     nodule, hypothyroidism pt was on synthyroid but is not having to take currently pt states her MD states she does not have issues with   . Spinal stenosis     mild hx of herniated disc  . Arthritis     osteoarthritis  . Cancer     melanoma- right upper arm  . Osteomyelitis     left leg  . Renal cyst     benign, found on CT scan, is being followed  . History of frequent urinary tract infections     on prophalaytic medication    Past Surgical History  Procedure Laterality Date  . Appendectomy  03/2011    laparoscopic  . Repair of rt med meniscal tear    . Skin cancer excision      right upper arm  . Left tibia      for osteomelitis of left tibia at age 30  .  Colonscopy     . Total knee arthroplasty Bilateral 03/09/2015    Procedure: BILATERAL TOTAL KNEE ARTHROPLASTY; Surgeon: Gaynelle Arabian, MD; Location: [facility] ORS; Service: Orthopedics; Laterality: Bilateral;   Family History  Problem Relation Age of Onset  . Colon cancer Neg Hx   . Esophageal cancer Neg Hx   . Stomach cancer Neg Hx   . Rectal cancer Neg Hx   . Thyroid disease Neg Hx   . Diabetes Mother     borderline type II  . Hypertension Mother   . Arthritis Mother   . Hypertension Father   . Stroke Father   . Hypertension Other     all grandparents  . Stroke Maternal Grandmother   . Osteoporosis Maternal Grandmother   . Stroke Paternal Grandfather   . Aneurysm Maternal Aunt 63   Social History:  reports that she quit smoking about 21 years ago. Her smoking use included Cigarettes. She has a 40 pack-year smoking history. She has never used smokeless tobacco. She reports that she drinks about 1.2 oz of alcohol per week. She reports that she uses illicit drugs. Allergies:  Allergies  Allergen Reactions  . Hydrocodone Other (See Comments)    Makes me goofy   Medications Prior to Admission  Medication Sig Dispense Refill  . Biotin 1 MG CAPS Take 1 tablet by mouth every morning.    Marland Kitchen BLACK COHOSH PO Take 300 mg by mouth every morning.     . chlorthalidone (HYGROTON) 25 MG tablet Take 25 mg by mouth every morning.     Marland Kitchen CRANBERRY PO Take 465 mg by mouth  every morning.    Marland Kitchen estradiol (ESTRING) 2 MG vaginal ring Place 2 mg vaginally every 3 (three) months. 1 each 3  . losartan (COZAAR) 100 MG tablet TAKE 1 TABLET DAILY 90 tablet 3  . meloxicam (MOBIC) 7.5 MG tablet Take 15 mg by mouth every morning.   5  . Multiple Vitamin (MULTIVITAMIN) tablet Take 1 tablet by mouth every morning.     . nitrofurantoin (MACRODANTIN) 50 MG capsule Take 50 mg by mouth  at bedtime.    . cephALEXin (KEFLEX) 500 MG capsule Take 1 capsule (500 mg total) by mouth 4 (four) times daily. Take all of medicine and drink lots of fluids (Patient not taking: Reported on 02/18/2015) 20 capsule 0    Home: Home Living Family/patient expects to be discharged to:: Inpatient rehab Living Arrangements: Spouse/significant other Available Help at Discharge: Family Type of Home: House Home Layout: Two level, Able to live on main level with bedroom/bathroom Home Equipment: None  Functional History: Prior Function Level of Independence: Independent  Functional Status:  Mobility: Bed Mobility Overal bed mobility: Needs Assistance, +2 for physical assistance, + 2 for safety/equipment Bed Mobility: Sit to Supine Supine to sit: Mod assist, +2 for physical assistance, +2 for safety/equipment Sit to supine: Mod assist, +2 for safety/equipment, +2 for physical assistance General bed mobility comments: cues for sequence with assist to manage bil LEs and to bring trunk to upright Transfers Overall transfer level: Needs assistance Equipment used: Rolling walker (2 wheeled) Transfers: Sit to/from Stand Sit to Stand: Mod assist, Max assist, +2 physical assistance, +2 safety/equipment Stand pivot transfers: Mod assist, Max assist, +2 physical assistance, +2 safety/equipment General transfer comment: cues for use of UEs to self assist and for LE management; Physical assist for support, balance, RW management and to advance L LE Ambulation/Gait General Gait Details: stand pvt with RW only 2* ltd sensation L LE    ADL:   Bed Mobility Overal bed mobility: Needs Assistance;+2 for physical assistance;+ 2 for safety/equipment Bed Mobility: Supine to Sit   Supine to sit: Mod assist;+2 for physical assistance;+2 for safety/equipment   General bed mobility comments: cues for sequence, assist to manage bil LEs and assist to bring trunk to  upright  Transfers Overall transfer level: Needs assistance Equipment used: Rolling walker (2 wheeled) Transfers: Sit to/from Stand Sit to Stand: Mod assist;From elevated surface;+2 physical assistance;+2 safety/equipment Stand pivot transfers: Mod assist;+2 physical assistance;+2 safety/equipment  Overall ADL's : Needs assistance/impaired   Grooming: Set up;Sitting  Upper Body Bathing: Set up;Sitting  Lower Body Bathing: Maximal assistance;+2 for physical assistance;Sit to/from stand  Upper Body Dressing : Minimal assistance;Sitting  Lower Body Dressing: Total assistance;+2 for physical assistance;Sit to/from stand  Toilet Transfer: Moderate assistance;+2 for physical assistance;Stand-pivot (to recliner)  Toileting- Clothing Manipulation and Hygiene: Total assistance;+2 for physical assistance;Sit to/from stand     General ADL Comments: pt became nauseas and dizzy when sitting EOB--RN brought pain medication. Pt limited with ADLS because of this. Did not introduce AE at this time. Pt is unable to lift legs independently--will plan to introduce reacher with washcloth for bathing  General transfer comment: cues for UE/LE placement; Physical assist for support, balance, RW management  Cognition: Cognition Overall Cognitive Status: Within Functional Limits for tasks assessed Orientation Level: Oriented X4 Cognition Arousal/Alertness: Awake/alert Behavior During Therapy: WFL for tasks assessed/performed Overall Cognitive Status: Within Functional Limits for tasks assessed  Physical Exam: Blood pressure 134/66, pulse 73, temperature 98.6 F (37 C), temperature source Oral, resp.  rate 18, height _0  (1.702 m), weight 67.132 kg (148 lb), last menstrual period 01/01/2008, SpO2 100 %.    Physical Exam Constitutional: She is oriented to person, place, and time. She appears well-developed.  HENT:  Head: Normocephalic.  Eyes: EOM are normal.   Neck: Normal range of motion. Neck supple. No thyromegaly present.  Cardiovascular: Normal rate and regular rhythm.  Respiratory: Effort normal and breath sounds normal. No respiratory distress.  GI: Soft. Bowel sounds are normal. She exhibits no distension.  Neurological: She is alert and oriented to person, place, and time.  Skin:  Bilateral knee incisions are dressed appropriately tender  Motor: 5/5 in Bilateral delt, bi, tri, grip Right 3-/5 HF, 2+KE, 4/5 , ankle is 4/5 Left 2- HF, 3- KE 3+ to 4/5    Lab Results Last 48 Hours    Results for orders placed or performed during the hospital encounter of 03/09/15 (from the past 48 hour(s))  ABO/Rh Status: None   Collection Time: 03/09/15 9:00 AM  Result Value Ref Range   ABO/RH(D) O NEG   Type and screen Status: None   Collection Time: 03/09/15 9:10 AM  Result Value Ref Range   ABO/RH(D) O NEG    Antibody Screen NEG    Sample Expiration 03/12/2015   CBC Status: Abnormal   Collection Time: 03/10/15 5:30 AM  Result Value Ref Range   WBC 8.4 4.0 - 10.5 K/uL   RBC 3.29 (L) 3.87 - 5.11 MIL/uL   Hemoglobin 10.5 (L) 12.0 - 15.0 g/dL   HCT 30.5 (L) 36.0 - 46.0 %   MCV 92.7 78.0 - 100.0 fL   MCH 31.9 26.0 - 34.0 pg   MCHC 34.4 30.0 - 36.0 g/dL   RDW 12.2 11.5 - 15.5 %   Platelets 215 150 - 400 K/uL  Basic metabolic panel Status: Abnormal   Collection Time: 03/10/15 5:30 AM  Result Value Ref Range   Sodium 138 135 - 145 mmol/L   Potassium 3.4 (L) 3.5 - 5.1 mmol/L   Chloride 105 96 - 112 mmol/L   CO2 25 19 - 32 mmol/L   Glucose, Bld 144 (H) 70 - 99 mg/dL   BUN 11 6 - 23 mg/dL   Creatinine, Ser 0.52 0.50 - 1.10 mg/dL   Calcium 8.5 8.4 - 10.5 mg/dL   GFR calc non Af Amer >90 >90 mL/min   GFR calc Af Amer >90 >90 mL/min    Comment: (NOTE) The eGFR has been calculated using the CKD  EPI equation. This calculation has not been validated in all clinical situations. eGFR's persistently <90 mL/min signify possible Chronic Kidney Disease.    Anion gap 8 5 - 15  Protime-INR Status: None   Collection Time: 03/10/15 5:30 AM  Result Value Ref Range   Prothrombin Time 12.9 11.6 - 15.2 seconds   INR 0.96 0.00 - 1.49      Imaging Results (Last 48 hours)    No results found.       Medical Problem List and Plan: 1. 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 DVT Prophylaxis/Anticoagulation: Coumadin for DVT prophylaxis. On for a bleeding episode. Check vascular study 3. Pain Management: Oxycodone and Robaxin as needed. Monitor with increased mobility 4. Acute blood loss anemia. Follow-up CBC 5. Neuropsych: This patient is capable of making decisions on her own behalf. 6. Skin/Wound Care: Routine skin checks 7. Fluids/Electrolytes/Nutrition: Strict I&O with follow-up chemistries 8. Hypertension. [place]25 mg daily, Cozaar  100 mg daily. 9. Constipation. Adjust bowel program. No nausea vomiting. 10. Recurrent UTIs. Continue prophylactic Macrodantin as prior to admission. Follow-up urine study if needed. Foley catheter to removed 11. MRSA PCR screen positive. Maintained on contact precautions  Post Admission Physician Evaluation: 1. Functional deficits secondary to endstage OA of the knees s/p bilateral TKA's. 2. Patient is admitted to receive collaborative, interdisciplinary care between the physiatrist, rehab nursing staff, and therapy team. 3. Patient's level of medical complexity and substantial therapy needs in context of that medical necessity cannot be provided at a lesser intensity of care such as a SNF. 4. Patient has experienced substantial functional loss from his/her baseline which was documented above under the "Functional History" and "Functional Status"  headings. Judging by the patient's diagnosis, physical exam, and functional history, the patient has potential for functional progress which will result in measurable gains while on inpatient rehab. These gains will be of substantial and practical use upon discharge in facilitating mobility and self-care at the household level. 5. Physiatrist will provide 24 hour management of medical needs as well as oversight of the therapy plan/treatment and provide guidance as appropriate regarding the interaction of the two. 6. 24 hour rehab nursing will assist with bladder management, bowel management, safety, skin/wound care, medication administration, pain management and patient education and help integrate therapy concepts, techniques,education, etc. 7. PT will assess and treat for/with: Lower extremity strength, range of motion, stamina, balance, functional mobility, safety, adaptive techniques and equipment, knee ROM, pain control. Goals are: mod I. 8. OT will assess and treat for/with: ADL's, functional mobility, safety, upper extremity strength, adaptive techniques and equipment, pain mgt, wound care. Goals are: mod I. Therapy may proceed with showering this patient. 9. SLP will assess and treat for/with: n/a. Goals are: n/a. 10. Case Management and Social Worker will assess and treat for psychological issues and discharge planning. 11. Team conference will be held weekly to assess progress toward goals and to determine barriers to discharge. 12. Patient will receive at least 3 hours of therapy per day at least 5 days per week. 13. ELOS: 7-10 days  14. Prognosis: excellent     Meredith Staggers, MD, [place] 03/14/2015

## 2015-03-14 NOTE — Progress Notes (Signed)
Rehab admissions - I met with patient briefly this am.  I will follow up with insurance carrier today regarding potential inpatient rehab admission.  I will then let all know insurance decision later today.  We do have beds on rehab today.  Call me for questions.  #[phone]

## 2015-03-14 NOTE — Progress Notes (Signed)
Report called to jennifer at [facility]  D Isaack Preble rn

## 2015-03-14 NOTE — Progress Notes (Signed)
Orthopedic Tech Progress Note Patient Details:  Tara Stanley 17-Mar-1956 [id number]  CPM Left Knee CPM Left Knee: On Left Knee Flexion (Degrees): 60 Left Knee Extension (Degrees): 0 CPM Right Knee CPM Right Knee: On Right Knee Flexion (Degrees): 60 Right Knee Extension (Degrees): 0 RN called the PA for the bilateral degrees of flexion which have been applied  [name] 03/14/2015, 4:09 PM

## 2015-03-14 NOTE — Discharge Instructions (Addendum)
° °Dr. Frank Aluisio °Total Joint Specialist °[facility] °[address] °[place], [place] [address] °[phone] ° °TOTAL KNEE REPLACEMENT POSTOPERATIVE DIRECTIONS ° ° ° °Knee Rehabilitation, Guidelines Following Surgery  °Results after knee surgery are often greatly improved when you follow the exercise, range of motion and muscle strengthening exercises prescribed by your doctor. Safety measures are also important to protect the knee from further injury. Any time any of these exercises cause you to have increased pain or swelling in your knee joint, decrease the amount until you are comfortable again and slowly increase them. If you have problems or questions, call your caregiver or physical therapist for advice.  ° °HOME CARE INSTRUCTIONS  °Remove items at home which could result in a fall. This includes throw rugs or furniture in walking pathways.  °Continue medications as instructed at time of discharge. °You may have some home medications which will be placed on hold until you complete the course of blood thinner medication.  °You may start showering once you are discharged home but do not submerge the incision under water. Just pat the incision dry and apply a dry gauze dressing on daily. °Walk with walker as instructed.  °You may resume a sexual relationship in one month or when given the OK by  your doctor.  °· Use walker as long as suggested by your caregivers. °· Avoid periods of inactivity such as sitting longer than an hour when not asleep. This helps prevent blood clots.  °You may put full weight on your legs and walk as much as is comfortable.  °You may return to work once you are cleared by your doctor.  °Do not drive a car for 6 weeks or until released by you surgeon.  °· Do not drive while taking narcotics.  °Wear the elastic stockings for three weeks following surgery during the day but you may remove then at night. °Make sure you keep all of your appointments after your  operation with all of your doctors and caregivers. You should call the office at the above phone number and make an appointment for approximately two weeks after the date of your surgery. °Change the dressing daily and reapply a dry dressing each time. °Please pick up a stool softener and laxative for home use as long as you are requiring pain medications. °· ICE to the affected knee every three hours for 30 minutes at a time and then as needed for pain and swelling.  Continue to use ice on the knee for pain and swelling from surgery. You may notice swelling that will progress down to the foot and ankle.  This is normal after surgery.  Elevate the leg when you are not up walking on it.   °It is important for you to complete the blood thinner medication as prescribed by your doctor. °· Continue to use the breathing machine which will help keep your temperature down.  It is common for your temperature to cycle up and down following surgery, especially at night when you are not up moving around and exerting yourself.  The breathing machine keeps your lungs expanded and your temperature down. ° °RANGE OF MOTION AND STRENGTHENING EXERCISES  °Rehabilitation of the knee is important following a knee injury or an operation. After just a few days of immobilization, the muscles of the thigh which control the knee become weakened and shrink (atrophy). Knee exercises are designed to build up the tone and strength of the thigh muscles and to improve knee   motion. Often times heat used for twenty to thirty minutes before working out will loosen up your tissues and help with improving the range of motion but do not use heat for the first two weeks following surgery. These exercises can be done on a training (exercise) mat, on the floor, on a table or on a bed. Use what ever works the best and is most comfortable for you Knee exercises include:  Leg Lifts - While your knee is still immobilized in a splint or cast, you can do  straight leg raises. Lift the leg to 60 degrees, hold for 3 sec, and slowly lower the leg. Repeat 10-20 times 2-3 times daily. Perform this exercise against resistance later as your knee gets better.  Quad and Hamstring Sets - Tighten up the muscle on the front of the thigh (Quad) and hold for 5-10 sec. Repeat this 10-20 times hourly. Hamstring sets are done by pushing the foot backward against an object and holding for 5-10 sec. Repeat as with quad sets.  A rehabilitation program following serious knee injuries can speed recovery and prevent re-injury in the future due to weakened muscles. Contact your doctor or a physical therapist for more information on knee rehabilitation.   SKILLED REHAB INSTRUCTIONS: If the patient is transferred to a skilled rehab facility following release from the hospital, a list of the current medications will be sent to the facility for the patient to continue.  When discharged from the skilled rehab facility, please have the facility set up the patient's [place] prior to being released. Also, the skilled facility will be responsible for providing the patient with their medications at time of release from the facility to include their pain medication, the muscle relaxants, and their blood thinner medication. If the patient is still at the rehab facility at time of the two week follow up appointment, the skilled rehab facility will also need to assist the patient in arranging follow up appointment in our office and any transportation needs.  MAKE SURE YOU:  Understand these instructions.  Will watch your condition.  Will get help right away if you are not doing well or get worse.    Pick up stool softner and laxative for home use following surgery while on pain medications. Do not submerge incision under water. Please use good hand washing techniques while changing dressing each day. May shower starting three days after surgery. Please use a clean  towel to pat the incision dry following showers. Continue to use ice for pain and swelling after surgery. Do not use any lotions or creams on the incision until instructed by your surgeon.   Take Xarelto for three and a half more weeks, then discontinue Xarelto. Once the patient has completed the blood thinner regimen, then take a Baby 81 mg Aspirin daily for three more weeks.  Postoperative Constipation Protocol  Constipation - defined medically as fewer than three stools per week and severe constipation as less than one stool per week.  One of the most common issues patients have following surgery is constipation.  Even if you have a regular bowel pattern at home, your normal regimen is likely to be disrupted due to multiple reasons following surgery.  Combination of anesthesia, postoperative narcotics, change in appetite and fluid intake all can affect your bowels.  In order to avoid complications following surgery, here are some recommendations in order to help you during your recovery period.  Colace (docusate) - Pick up an over-the-counter form  of Colace or another stool softener and take twice a day as long as you are requiring postoperative pain medications.  Take with a full glass of water daily.  If you experience loose stools or diarrhea, hold the colace until you stool forms back up.  If your symptoms do not get better within 1 week or if they get worse, check with your doctor.  Dulcolax (bisacodyl) - Pick up over-the-counter and take as directed by the product packaging as needed to assist with the movement of your bowels.  Take with a full glass of water.  Use this product as needed if not relieved by Colace only.   MiraLax (polyethylene glycol) - Pick up over-the-counter to have on hand.  MiraLax is a solution that will increase the amount of water in your bowels to assist with bowel movements.  Take as directed and can mix with a glass of water, juice, soda, coffee, or tea.  Take if  you go more than two days without a movement. Do not use MiraLax more than once per day. Call your doctor if you are still constipated or irregular after using this medication for 7 days in a row.  If you continue to have problems with postoperative constipation, please contact the office for further assistance and recommendations.  If you experience "the worst abdominal pain ever" or develop nausea or vomiting, please contact the office immediatly for further recommendations for treatment.  When discharged from the skilled rehab facility, please have the facility set up the patient's [place] prior to being released.   Also provide the patient with their medications at time of release from the facility to include their pain medication, the muscle relaxants, and their blood thinner medication.  If the patient is still at the rehab facility at time of follow up appointment, please also assist the patient in arranging follow up appointment in our office and any transportation needs. ICE to the affected knee or hip every three hours for 30 minutes at a time and then as needed for pain and swelling.

## 2015-03-14 NOTE — Progress Notes (Signed)
ANTICOAGULATION CONSULT NOTE - Follow Up  Pharmacy Consult for warfarin Indication: VTE prophylaxis   Allergies  Allergen Reactions  . Hydrocodone Other (See Comments)    Makes me goofy   Patient Measurements: Height: 5\' 7"  (170.2 cm) Weight: 148 lb (67.132 kg) IBW/kg (Calculated) : 61.6  Vital Signs: Temp: 98.6 F (37 C) (03/14 0420) Temp Source: Oral (03/14 0420) BP: 115/66 mmHg (03/14 0420) Pulse Rate: 88 (03/14 0420)  Labs:  Recent Labs  03/12/15 0520 03/13/15 0427 03/14/15 0500  HGB 9.0*  --   --   HCT 26.3*  --   --   PLT 174  --   --   LABPROT 12.8 13.4 13.5  INR 0.95 1.01 1.02   Estimated Creatinine Clearance: 72.7 mL/min (by C-G formula based on Cr of 0.48).  Scheduled:  . chlorthalidone  25 mg Oral q morning - 10a  . docusate sodium  100 mg Oral BID  . enoxaparin (LOVENOX) injection  30 mg Subcutaneous Q12H  . losartan  100 mg Oral Daily  . nitrofurantoin  50 mg Oral QHS  . warfarin   Does not apply Once  . Warfarin - Pharmacist Dosing Inpatient   Does not apply q1800   Assessment: 58yoF s/p bilateral TKA on ropivacaine epidural post-op; pharmacy consulted to dose warfarin for VTE prophylaxis  3/11: epidural catheter removed at 0900. Lovenox started in the PM.  Today, 03/14/2015:  INR 1.02, slow to rise after warfarin 4, 5, 5, 7.5mg   3/12 - Hgb low, stable, Plt wnl  No bleeding issues noted per nursing  Eating 100% of meals  Goal of Therapy:  INR 2-3  Plan:   Repeat warfarin 7.5mg  po x 1 @ 12N  Daily INR  Cont Lovenox until INR 2 or greater.  CBC tomorrow and q72h while on warfarin  Monitor for signs of bleeding  Romeo Rabon, PharmD, pager [phone]. 03/14/2015,9:44 AM.

## 2015-03-14 NOTE — Progress Notes (Signed)
Occupational Therapy Treatment Patient Details Name: Tara Stanley MRN: [id number] DOB: [birth year] Today's Date: 03/14/2015    History of present illness B TKR   OT comments  Pt making progress with functional goals and should continue with acute OT services to address impairments to increase safety and level of function  Follow Up Recommendations  CIR    Equipment Recommendations  3 in 1 bedside comode    Recommendations for Other Services      Precautions / Restrictions Precautions Precautions: Knee;Fall Required Braces or Orthoses: Knee Immobilizer - Right;Knee Immobilizer - Left Knee Immobilizer - Right: Discontinue once straight leg raise with < 10 degree lag Knee Immobilizer - Left: Discontinue once straight leg raise with < 10 degree lag Restrictions Weight Bearing Restrictions: No Other Position/Activity Restrictions: WBAT       Mobility Bed Mobility Overal bed mobility: Needs Assistance Bed Mobility: Supine to Sit     Supine to sit: Min guard        Transfers Overall transfer level: Needs assistance Equipment used: Rolling walker (2 wheeled) Transfers: Sit to/from Stand Sit to Stand: Min assist              Balance Overall balance assessment: Needs assistance                                 ADL       Grooming: Min guard;Standing       Lower Body Bathing: Sit to/from stand;Moderate assistance           Toilet Transfer: Minimal assistance;Comfort height toilet;RW;Grab bars   Toileting- Clothing Manipulation and Hygiene: Sit to/from stand;Moderate assistance         General ADL Comments: pt familar with ADL A/E for use on CIR and home      Vision  no change from baseline                              Cognition   Behavior During Therapy: WFL for tasks assessed/performed Overall Cognitive Status: Within Functional Limits for tasks assessed                       Extremity/Trunk Assessment   WFL                         General Comments  pt pleasant and cooperative    Pertinent Vitals/ Pain       Pain Assessment: 0-10 Pain Score: 4  Pain Location: B knees Pain Descriptors / Indicators: Aching;Sore Pain Intervention(s): Monitored during session;Repositioned;Ice applied  Home Living  home alone                                        Prior Functioning/Environment  independent           Frequency Min 2X/week     Progress Toward Goals  OT Goals(current goals can now be found in the care plan section)  Progress towards OT goals: Progressing toward goals     Plan Discharge plan remains appropriate                     End of Session Equipment Utilized During Treatment: Rolling walker;Other (comment) (3 in 1)  Activity Tolerance Patient tolerated treatment well   Patient Left in chair;with call bell/phone within reach             Time: 5038-8828 OT Time Calculation (min): 34 min  Charges: OT General Charges $OT Visit: 1 Procedure OT Treatments $Self Care/Home Management : 8-22 mins $Therapeutic Activity: 8-22 mins  Britt Bottom 03/14/2015, 12:50 PM

## 2015-03-14 NOTE — Progress Notes (Signed)
ANTICOAGULATION CONSULT NOTE - Initial Consult  Pharmacy Consult for coumadin Indication: VTE prophylaxis  Allergies  Allergen Reactions  . Hydrocodone Other (See Comments)    Makes me goofy    Patient Measurements:   Heparin Dosing Weight:   Vital Signs: Temp: 99.4 F (37.4 C) (03/14 1352) Temp Source: Oral (03/14 1352) BP: 121/67 mmHg (03/14 1352) Pulse Rate: 88 (03/14 1352)  Labs:  Recent Labs  03/12/15 0520 03/13/15 0427 03/14/15 0500  HGB 9.0*  --   --   HCT 26.3*  --   --   PLT 174  --   --   LABPROT 12.8 13.4 13.5  INR 0.95 1.01 1.02    Estimated Creatinine Clearance: 72.7 mL/min (by C-G formula based on Cr of 0.48).   Medical History: Past Medical History  Diagnosis Date  . Hypertension   . Thyroid disease      nodule, hypothyroidism pt was on synthyroid but is not having to take currently pt states her MD states she does not have issues with   . Spinal stenosis     mild hx of herniated disc  . Arthritis     osteoarthritis  . Cancer     melanoma- right upper arm  . Osteomyelitis     left leg  . Renal cyst     benign, found on CT scan, is being followed  . History of frequent urinary tract infections     on prophalaytic medication     Medications:  Scheduled:  . [START ON 03/15/2015] chlorthalidone  25 mg Oral q morning - 10a  . docusate sodium  100 mg Oral BID  . [START ON 03/15/2015] losartan  100 mg Oral Daily  . warfarin  7.5 mg Oral ONCE-1800  . Warfarin - Pharmacist Dosing Inpatient   Does not apply q1800   Infusions:    Assessment: 59 yo female s/p TKA is currently continued on coumadin.  Patient was transferred from [facility]. Epidural catheter was removed on 03/11 @ 0900 and lovenox was started that evening but was not to be continued at transfer. INR today is 1.02 (slow to rise after warfarin 4, 5, 5, and 7.5 mg). 3/12 Hgb was low but stable and Plt was WNL  Goal of Therapy:  INR 2-3 Monitor platelets by anticoagulation protocol:  Yes   Plan:  - coumadin 7.5 mg po x1, - INR ina m  Jaylynne Birkhead, Tsz-Yin 03/14/2015,2:25 PM

## 2015-03-14 NOTE — Progress Notes (Signed)
VASCULAR LAB PRELIMINARY  PRELIMINARY  PRELIMINARY  PRELIMINARY  BLEV duplex completed.    Preliminary report:  Negative DVT bilaterally.  Positive ruptured Baker's Cyst right and left popliteal fossas.  August Albino, RVT 03/14/2015, 5:23 PM

## 2015-03-14 NOTE — Progress Notes (Signed)
Orthopedic Tech Progress Note Patient Details:  Tara Stanley 01-10-1956 [id number]  Patient ID: Tara Stanley, female   DOB: 02-Feb-1956, 59 y.o.   MRN: [id number]  cpms were applied @ [facility] [place], [name] 03/14/2015, 4:10 PM

## 2015-03-15 ENCOUNTER — Inpatient Hospital Stay (HOSPITAL_COMMUNITY): Payer: 59 | Admitting: Physical Therapy

## 2015-03-15 ENCOUNTER — Inpatient Hospital Stay (HOSPITAL_COMMUNITY): Payer: 59

## 2015-03-15 DIAGNOSIS — D62 Acute posthemorrhagic anemia: Secondary | ICD-10-CM

## 2015-03-15 DIAGNOSIS — E876 Hypokalemia: Secondary | ICD-10-CM

## 2015-03-15 LAB — CBC WITH DIFFERENTIAL/PLATELET
Basophils Absolute: 0 10*3/uL (ref 0.0–0.1)
Basophils Relative: 1 % (ref 0–1)
Eosinophils Absolute: 0.3 10*3/uL (ref 0.0–0.7)
Eosinophils Relative: 6 % — ABNORMAL HIGH (ref 0–5)
HCT: 26.9 % — ABNORMAL LOW (ref 36.0–46.0)
Hemoglobin: 9 g/dL — ABNORMAL LOW (ref 12.0–15.0)
LYMPHS ABS: 1.2 10*3/uL (ref 0.7–4.0)
LYMPHS PCT: 21 % (ref 12–46)
MCH: 30.7 pg (ref 26.0–34.0)
MCHC: 33.5 g/dL (ref 30.0–36.0)
MCV: 91.8 fL (ref 78.0–100.0)
MONOS PCT: 13 % — AB (ref 3–12)
Monocytes Absolute: 0.8 10*3/uL (ref 0.1–1.0)
NEUTROS PCT: 59 % (ref 43–77)
Neutro Abs: 3.4 10*3/uL (ref 1.7–7.7)
Platelets: 297 10*3/uL (ref 150–400)
RBC: 2.93 MIL/uL — AB (ref 3.87–5.11)
RDW: 12.4 % (ref 11.5–15.5)
WBC: 5.7 10*3/uL (ref 4.0–10.5)

## 2015-03-15 LAB — COMPREHENSIVE METABOLIC PANEL
ALK PHOS: 48 U/L (ref 39–117)
ALT: 21 U/L (ref 0–35)
AST: 20 U/L (ref 0–37)
Albumin: 2.9 g/dL — ABNORMAL LOW (ref 3.5–5.2)
Anion gap: 6 (ref 5–15)
BUN: 12 mg/dL (ref 6–23)
CO2: 33 mmol/L — ABNORMAL HIGH (ref 19–32)
Calcium: 9 mg/dL (ref 8.4–10.5)
Chloride: 96 mmol/L (ref 96–112)
Creatinine, Ser: 0.54 mg/dL (ref 0.50–1.10)
GFR calc non Af Amer: >90 mL/min (ref >90)
GLUCOSE: 117 mg/dL — AB (ref 70–99)
POTASSIUM: 3.4 mmol/L — AB (ref 3.5–5.1)
SODIUM: 135 mmol/L (ref 135–145)
TOTAL PROTEIN: 5.9 g/dL — AB (ref 6.0–8.3)
Total Bilirubin: 0.8 mg/dL (ref 0.3–1.2)

## 2015-03-15 LAB — PROTIME-INR
INR: 1.05 (ref 0.00–1.49)
Prothrombin Time: 13.8 seconds (ref 11.6–15.2)

## 2015-03-15 MED ORDER — WARFARIN SODIUM 10 MG PO TABS
10.0000 mg | ORAL_TABLET | Freq: Once | ORAL | Status: AC
  Administered 2015-03-15: 10 mg via ORAL
  Filled 2015-03-15 (×2): qty 1

## 2015-03-15 MED ORDER — POTASSIUM CHLORIDE CRYS ER 20 MEQ PO TBCR
20.0000 meq | EXTENDED_RELEASE_TABLET | Freq: Once | ORAL | Status: AC
  Administered 2015-03-15: 20 meq via ORAL
  Filled 2015-03-15: qty 1

## 2015-03-15 MED ORDER — OXYCODONE HCL 5 MG PO TABS: 5.0000 mg | ORAL_TABLET | Freq: Four times a day (QID) | ORAL | Status: DC | PRN

## 2015-03-15 NOTE — Evaluation (Signed)
Physical Therapy Assessment and Plan  Patient Details  Name: Tara Stanley MRN: [id number] Date of Birth: [birth year]  PT Diagnosis: Difficulty walking, Edema, Muscle weakness and Pain in bilateral knees Rehab Potential: Excellent ELOS: 5-7 days   Today's Date: 03/15/2015 PT Individual Time: 0900-1000 PT Individual Time Calculation (min): 60 min    Problem List:  Patient Active Problem List   Diagnosis Date Noted  . Status post bilateral knee replacements 03/14/2015  . OA (osteoarthritis) of knee 03/09/2015  . Preoperative cardiovascular examination 01/06/2015  . Hypertension 02/17/2012  . Multinodular goiter 02/17/2012  . Spinal stenosis of lumbar region 02/17/2012  . 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 Menopause 08/18/2010  . BP (high blood pressure) 08/19/2007  . Malignant melanoma 08/19/2007  . Adult hypothyroidism 08/19/2003  . Arthritis of knee, degenerative 11/25/1999    Past Medical History:  Past Medical History  Diagnosis Date  . Hypertension   . Thyroid disease      nodule, hypothyroidism pt was on synthyroid but is not having to take currently pt states her MD states she does not have issues with   . Spinal stenosis     mild hx of herniated disc  . Arthritis     osteoarthritis  . Cancer     melanoma- right upper arm  . Osteomyelitis     left leg  . Renal cyst     benign, found on CT scan, is being followed  . History of frequent urinary tract infections     on prophalaytic medication    Past Surgical History:  Past Surgical History  Procedure Laterality Date  . Appendectomy  03/2011    laparoscopic  . Repair of rt med meniscal tear    . Skin cancer excision      right upper arm  . Left tibia      for osteomelitis of left tibia at age 63  . Colonscopy     . Total knee arthroplasty Bilateral 03/09/2015    Procedure: BILATERAL TOTAL KNEE ARTHROPLASTY;  Surgeon: Gaynelle Arabian, MD;  Location: [facility];  Service: Orthopedics;   Laterality: Bilateral;    Assessment & Plan Clinical Impression: Tara Stanley is a 59 y.o. right handed female with history of hypertension. Patient independent prior to admission living with her husband and active. Presented 03/08/2015 with end-stage osteoarthritis bilateral knees. No change with conservative care including activity modification. Underwent bilateral total knee arthroplasty 03/09/2015 per Dr.Alusio. Hospital course pain management with epidural removed 03/11/2015. Coumadin for DVT prophylaxis. Weightbearing as tolerated. Acute blood loss anemia 9.0 and monitor. MRSA PCR screen positive maintained on contact precautions. Patient transferred to CIR on 03/14/2015.   Patient currently requires supervision-min with mobility secondary to pain, decreased ROM, decreased balance, muscle weakness and muscle joint tightness and decreased cardiorespiratoy endurance.  Prior to hospitalization, patient was independent  with mobility and lived with Spouse in a House home.  Home access is  Level entry.  Patient will benefit from skilled PT intervention to maximize safe functional mobility, minimize fall risk and decrease caregiver burden for planned discharge home with intermittent assist.  Anticipate patient will benefit from follow up OP at discharge.  PT - End of Session Activity Tolerance: Tolerates 30+ min activity with multiple rests Endurance Deficit: Yes Endurance Deficit Description: requires rest breaks with mobility PT Assessment Rehab Potential (ACUTE/IP ONLY): Excellent PT Patient demonstrates impairments in the following area(s): Balance;Edema;Endurance;Motor;Nutrition;Pain;Safety;Skin Integrity PT Transfers Functional Problem(s): Bed Mobility;Bed to Chair;Car;Furniture PT Locomotion  Functional Problem(s): Ambulation;Wheelchair Mobility;Stairs PT Plan PT Intensity: Minimum of 1-2 x/day ,45 to 90 minutes PT Frequency: 5 out of 7 days PT Duration Estimated Length of Stay: 5-7  days PT Treatment/Interventions: Ambulation/gait training;Balance/vestibular training;Community reintegration;Discharge planning;DME/adaptive equipment instruction;Functional mobility training;Neuromuscular re-education;Pain management;Patient/family education;Psychosocial support;Stair training;Therapeutic Activities;Therapeutic Exercise;UE/LE Strength taining/ROM;UE/LE Coordination activities;Wheelchair propulsion/positioning PT Transfers Anticipated Outcome(s): mod I PT Locomotion Anticipated Outcome(s): mod I ambulator PT Recommendation Follow Up Recommendations: Outpatient PT Patient destination: Home Equipment Recommended: Rolling walker with 5" wheels  Skilled Therapeutic Intervention Skilled therapeutic intervention initiated after completion of eval. Discussed falls risk, safety within room, and focus of therapy during stay. Discussed possible LOS, goals, and f/u therapy. Patient with increased pain and stiffness in BLE and reported initial difficulty with mobility which improved with increased activity. Patient left sitting in recliner with BLE elevated and call bell within reach, RN notified of patient need for ice.   PT Evaluation Precautions/Restrictions Precautions Precautions: Fall Restrictions Weight Bearing Restrictions: Yes RLE Weight Bearing: Weight bearing as tolerated LLE Weight Bearing: Weight bearing as tolerated Other Position/Activity Restrictions: WBAT General Chart Reviewed: Yes Family/Caregiver Present: No   Pain Pain Assessment Pain Assessment: 0-10 Pain Score: 5  Pain Type: Surgical pain Pain Location: Knee Pain Orientation: Right;Left Pain Descriptors / Indicators: Aching;Tightness Pain Onset: On-going Pain Intervention(s): Repositioned;Ambulation/increased activity Home Living/Prior Functioning Home Living Available Help at Discharge: Family;Available 24 hours/day Type of Home: House Home Access: Level entry Home Layout: Two level;Able to live on  main level with bedroom/bathroom  Lives With: Spouse Prior Function Level of Independence: Independent with basic ADLs;Independent with homemaking with ambulation;Independent with gait;Independent with transfers  Able to Take Stairs?: Yes Driving: Yes Vocation: Volunteer work Leisure: Hobbies-yes (Comment) Comments: working out, volunteering, traveling, very active PTA Vision/Perception  Vision - Assessment Eye Alignment: Within Functional Limits Ocular Range of Motion: Within Functional Limits Alignment/Gaze Preference: Within Defined Limits Tracking/Visual Pursuits: Able to track stimulus in all quads without difficulty Saccades: Within functional limits Convergence: Within functional limits  Cognition Overall Cognitive Status: Within Functional Limits for tasks assessed Arousal/Alertness: Awake/alert Orientation Level: Oriented X4 Attention: Alternating Alternating Attention: Appears intact Memory: Appears intact Awareness: Appears intact Problem Solving: Appears intact Safety/Judgment: Appears intact Sensation Sensation Light Touch: Impaired by gross assessment Hot/Cold: Appears Intact Proprioception: Appears Intact Additional Comments: numbness on lateral aspect of bilateral knees Coordination Gross Motor Movements are Fluid and Coordinated: No Fine Motor Movements are Fluid and Coordinated: Yes Coordination and Movement Description: limited by pain/decreased B knee ROM Motor  Motor Motor: Within Functional Limits  Mobility Bed Mobility Bed Mobility: Supine to Sit;Sit to Supine Supine to Sit: 5: Supervision;HOB flat Supine to Sit Details: Verbal cues for sequencing Sit to Supine: 4: Min assist;HOB flat Transfers Transfers: Yes Sit to Stand: 5: Supervision;With upper extremity assist;With armrests;From chair/3-in-1 Sit to Stand Details: Verbal cues for precautions/safety;Verbal cues for sequencing Stand to Sit: 5: Supervision;With upper extremity assist;With  armrests;To chair/3-in-1 Stand to Sit Details (indicate cue type and reason): Verbal cues for precautions/safety;Verbal cues for sequencing Locomotion  Ambulation Ambulation/Gait Assistance: 5: Supervision;4: Min guard Ambulation Distance (Feet): 150 Feet Gait Gait: Yes Gait Pattern: Impaired Gait Pattern: Step-to pattern;Step-through pattern;Decreased hip/knee flexion - right;Decreased hip/knee flexion - left;Antalgic;Trunk flexed Gait velocity: decreased Stairs / Additional Locomotion Stairs: Yes Stairs Assistance: 5: Supervision Stairs Assistance Details: Verbal cues for precautions/safety;Verbal cues for sequencing Stair Management Technique: Two rails;Step to pattern;Forwards Number of Stairs: 8 Height of Stairs: 4 Ramp: Not tested (comment) Curb: Not tested (comment) Wheelchair  Mobility Wheelchair Mobility: No (pt ambulatory)  Trunk/Postural Assessment  Cervical Assessment Cervical Assessment: Within Functional Limits Thoracic Assessment Thoracic Assessment: Within Functional Limits Lumbar Assessment Lumbar Assessment: Within Functional Limits Postural Control Postural Control: Deficits on evaluation Protective Responses: delayed  Balance Balance Balance Assessed: Yes Dynamic Sitting Balance Dynamic Sitting - Balance Support: Feet supported Dynamic Sitting - Level of Assistance: 6: Modified independent (Device/Increase time) Static Standing Balance Static Standing - Balance Support: During functional activity;Bilateral upper extremity supported Static Standing - Level of Assistance: 5: Stand by assistance Dynamic Standing Balance Dynamic Standing - Balance Support: During functional activity Dynamic Standing - Level of Assistance: 5: Stand by assistance Extremity Assessment  RUE Assessment RUE Assessment: Within Functional Limits (5/5 strength) LUE Assessment LUE Assessment: Within Functional Limits (5/5 strength) RLE Assessment RLE Assessment: Exceptions to  [facility] RLE AROM (degrees) Right Knee Extension: -20 (supine) Right Knee Flexion: 80 (sitting EOM) RLE Strength RLE Overall Strength: Deficits RLE Overall Strength Comments: hip flexion 3+/5, knee flexion/extension 3-/5, ankle DF/PF 5/5 LLE Assessment LLE Assessment: Exceptions to [facility] LLE AROM (degrees) Left Knee Extension: -15 (supine) Left Knee Flexion: 78 (sitting EOM) LLE Strength LLE Overall Strength: Deficits LLE Overall Strength Comments: grossly 3-/5 except 5/5 ankle DF/PF  FIM:  FIM - Control and instrumentation engineer Devices: Walker;Arm rests Bed/Chair Transfer: 5: Supine > Sit: Supervision (verbal cues/safety issues);5: Bed > Chair or W/C: Supervision (verbal cues/safety issues);4: Sit > Supine: Min A (steadying pt. > 75%/lift 1 leg);5: Chair or W/C > Bed: Supervision (verbal cues/safety issues) FIM - Locomotion: Wheelchair Locomotion: Wheelchair: 0: Activity did not occur (pt ambulatory) FIM - Locomotion: Ambulation Locomotion: Ambulation Assistive Devices: Administrator Ambulation/Gait Assistance: 5: Supervision;4: Min guard Locomotion: Ambulation: 4: Travels 150 ft or more with minimal assistance (Pt.>75%) FIM - Locomotion: Stairs Locomotion: Scientist, physiological: Hand rail - 2 Locomotion: Stairs: 2: Up and Down 4 - 11 stairs with minimal assistance (Pt.>75%)   Refer to Care Plan for Long Term Goals  Recommendations for other services: None  Discharge Criteria: Patient will be discharged from PT if patient refuses treatment 3 consecutive times without medical reason, if treatment goals not met, if there is a change in medical status, if patient makes no progress towards goals or if patient is discharged from hospital.  The above assessment, treatment plan, treatment alternatives and goals were discussed and mutually agreed upon: by patient  Laretta Alstrom 03/15/2015, 12:31 PM

## 2015-03-15 NOTE — Progress Notes (Signed)
Patient information reviewed and entered into [facility] system by Jc Veron, RN, CRRN, PPS Coordinator.  Information including medical coding and functional independence measure will be reviewed and updated through discharge.    

## 2015-03-15 NOTE — Progress Notes (Signed)
Orthopedic Tech Progress Note Patient Details:  [name] 24-Jul-1956 [id number]  Patient ID: [name], female   DOB: 1956/10/15, 59 y.o.   MRN: [id number] Placed pt's lle and rle in bilateral cpms @1350  @0 -[address] [place]  Terriyah Westra 03/15/2015, 3:52 PM

## 2015-03-15 NOTE — Progress Notes (Signed)
ANTICOAGULATION CONSULT NOTE - Follow Up Consult  Pharmacy Consult for coumadin Indication: VTE prophylaxis  Allergies  Allergen Reactions  . Hydrocodone Other (See Comments)    Makes me goofy    Patient Measurements:   Heparin Dosing Weight:   Vital Signs: Temp: 99.2 F (37.3 C) (03/15 0547) Temp Source: Oral (03/15 0547) BP: 127/61 mmHg (03/15 0547) Pulse Rate: 78 (03/15 0547)  Labs:  Recent Labs  03/13/15 0427 03/14/15 0500 03/15/15 0605  HGB  --   --  9.0*  HCT  --   --  26.9*  PLT  --   --  297  LABPROT 13.4 13.5 13.8  INR 1.01 1.02 1.05  CREATININE  --   --  0.54    Estimated Creatinine Clearance: 72.7 mL/min (by C-G formula based on Cr of 0.54).   Medications:  Scheduled:  . chlorthalidone  25 mg Oral q morning - 10a  . docusate sodium  100 mg Oral BID  . losartan  100 mg Oral Daily  . potassium chloride  20 mEq Oral Once  . Warfarin - Pharmacist Dosing Inpatient   Does not apply q1800   Infusions:    Assessment: 59 yo female s/p TKA is currently on subtherapeutic coumadin.  INR today still hasn't moved much at 1.05 after 7.5 mg x2 doses.  Goal of Therapy:  INR 2-3 Monitor platelets by anticoagulation protocol: Yes   Plan:  - coumadin 10 mg po x1, - INR in am  Tycho Cheramie, Tsz-Yin 03/15/2015,8:30 AM

## 2015-03-15 NOTE — Evaluation (Signed)
Occupational Therapy Assessment and Plan  Patient Details  Name: Tara Stanley MRN: [id number] Date of Birth: [birth year]  OT Diagnosis: acute pain, muscle weakness (generalized) and pain in joint Rehab Potential: Rehab Potential (ACUTE ONLY): Excellent ELOS: 5-7 days   Today's Date: 03/15/2015 OT Individual Time: 8338-2505 and 1100-1200 OT Individual Time Calculation (min): 60 min and 60 min      Problem List:  Patient Active Problem List   Diagnosis Date Noted  . Status post bilateral knee replacements 03/14/2015  . OA (osteoarthritis) of knee 03/09/2015  . Preoperative cardiovascular examination 01/06/2015  . Hypertension 02/17/2012  . Multinodular goiter 02/17/2012  . Spinal stenosis of lumbar region 02/17/2012  . 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 Menopause 08/18/2010  . BP (high blood pressure) 08/19/2007  . Malignant melanoma 08/19/2007  . Adult hypothyroidism 08/19/2003  . Arthritis of knee, degenerative 11/25/1999    Past Medical History:  Past Medical History  Diagnosis Date  . Hypertension   . Thyroid disease      nodule, hypothyroidism pt was on synthyroid but is not having to take currently pt states her MD states she does not have issues with   . Spinal stenosis     mild hx of herniated disc  . Arthritis     osteoarthritis  . Cancer     melanoma- right upper arm  . Osteomyelitis     left leg  . Renal cyst     benign, found on CT scan, is being followed  . History of frequent urinary tract infections     on prophalaytic medication    Past Surgical History:  Past Surgical History  Procedure Laterality Date  . Appendectomy  03/2011    laparoscopic  . Repair of rt med meniscal tear    . Skin cancer excision      right upper arm  . Left tibia      for osteomelitis of left tibia at age 46  . Colonscopy     . Total knee arthroplasty Bilateral 03/09/2015    Procedure: BILATERAL TOTAL KNEE ARTHROPLASTY;  Surgeon: Gaynelle Arabian, MD;  Location: [facility];  Service: Orthopedics;  Laterality: Bilateral;    Assessment & Plan Clinical Impression: Maryama Kuriakose is a 59 y.o. right handed female with history of hypertension. Patient independent prior to admission living with her husband and active. Presented 03/08/2015 with end-stage osteoarthritis bilateral knees. No change with conservative care including activity modification. Underwent bilateral total knee arthroplasty 03/09/2015 per Dr.Alusio. Hospital course pain management with epidural removed 03/11/2015. Coumadin for DVT prophylaxis. Weightbearing as tolerated. Acute blood loss anemia 9.0 and monitor. MRSA PCR screen positive maintained on contact precautions. Physical therapy evaluation completed with recommendations of physical medicine rehabilitation consult. M.D. has requested physical medicine rehabilitation consult. Patient transferred to CIR on 03/14/2015 .    Patient currently requires min-supervision with basic self-care skills and functional transfers secondary to muscle weakness and muscle joint tightness, decreased cardiorespiratoy endurance and decreased standing balance, decreased postural control and decreased balance strategies.  Prior to hospitalization, patient could complete BADLs and IADLs with independent .  Patient will benefit from skilled intervention to increase independence with basic self-care skills and increase level of independence with iADL prior to discharge home with husband at modified independent level.  Anticipate patient will require no supervision secondary to modified independent goals and no further OT follow recommended.  OT - End of Session Activity Tolerance: Decreased this session Endurance Deficit: Yes  OT Assessment Rehab Potential (ACUTE ONLY): Excellent OT Patient demonstrates impairments in the following area(s): Balance;Pain;Safety;Edema;Endurance;Motor OT Basic ADL's Functional Problem(s):  Grooming;Bathing;Dressing;Toileting OT Advanced ADL's Functional Problem(s): Simple Meal Preparation OT Transfers Functional Problem(s): Tub/Shower;Toilet OT Additional Impairment(s): None OT Plan OT Intensity: Minimum of 1-2 x/day, 45 to 90 minutes OT Frequency: 5 out of 7 days OT Duration/Estimated Length of Stay: 5-7 days OT Treatment/Interventions: Balance/vestibular training;Discharge planning;Community reintegration;DME/adaptive equipment instruction;Functional mobility training;Pain management;Patient/family education;Psychosocial support;Self Care/advanced ADL retraining;Skin care/wound managment;Therapeutic Activities;Therapeutic Exercise;Splinting/orthotics;UE/LE Strength taining/ROM;UE/LE Coordination activities;Wheelchair propulsion/positioning OT Self Feeding Anticipated Outcome(s): n/a OT Basic Self-Care Anticipated Outcome(s): Mod I OT Toileting Anticipated Outcome(s): Mod I OT Bathroom Transfers Anticipated Outcome(s): Mod I OT Recommendation Patient destination: Home Follow Up Recommendations: None Equipment Recommended: 3 in 1 bedside comode   Skilled Therapeutic Intervention Session 1:OT eval completed. Discussed role of OT, goals of therapy, possible ELOS, DME, safety plan, fall risk, and follow-up. ADL retraining focused on standing balance, functional transfers, activity tolerance, and functional mobility. Pt ambulated bed>bathroom at SBA with use of RW. Completed bathing at shower level with min A for washing B feet and supervision for standing balance. Completed dressing from recliner chair with pt able to don clothing over RLE, however required assist with LLE secondary to pain. Pt required SBA for standing balance during clothing management around waist. Pt left sitting in recliner chair drying hair with all needs in reach.   Session 2: Pt seen for 1:1 OT session with focus on functional transfers, standing balance, activity tolerance, and safety awareness. Pt received  sitting in recliner chair. Discussed rehab binder and education materials with pt verbalizing no questions at this time. Practiced walk-in shower transfer with use of 3 in ledge to step-over. Pt required CGA-SBA for transfer via backing into shower. Engaged in functional mobility outside on uneven surface with min cues for safety and SBA for functional mobility approx 40'. Pt required long rest breaks following all sit<>stand and functional transfers due to pain. Pt returned to room and left sitting in recliner chair with ice packs applied to B knees. Also discussed energy conservation techniques at home, pt would benefit from further education.   OT Evaluation Precautions/Restrictions  Precautions Precautions: Knee;Fall Restrictions Weight Bearing Restrictions: No Other Position/Activity Restrictions: WBAT General   Vital Signs Therapy Vitals Temp: 99.2 F (37.3 C) Temp Source: Oral Pulse Rate: 78 Resp: 19 BP: 127/61 mmHg Patient Position (if appropriate): Lying Oxygen Therapy SpO2: 97 % O2 Device: Not Delivered Pain Pain Assessment Pain Assessment: 0-10 Pain Score: 4  Pain Type: Surgical pain Pain Location: Knee Pain Orientation: Right;Left Home Living/Prior Functioning Home Living Family/patient expects to be discharged to:: Inpatient rehab Living Arrangements: Spouse/significant other Available Help at Discharge: Family Type of Home: House Home Layout: Two level, Able to live on main level with bedroom/bathroom IADL History Occupation: Psychologist, occupational work Prior Function Level of Independence: Independent with basic ADLs, Independent with homemaking with ambulation, Independent with gait Driving: Yes Vocation: Volunteer work Leisure: Hobbies-yes (Comment) Comments: working out, volunteering, very active PTA ADL   Vision/Perception  Vision- History Baseline Vision/History: Wears glasses Wears Glasses: Distance only Patient Visual Report: No change from  baseline Vision- Assessment Vision Assessment?: Yes Eye Alignment: Within Functional Limits Ocular Range of Motion: Within Functional Limits Alignment/Gaze Preference: Within Defined Limits Tracking/Visual Pursuits: Able to track stimulus in all quads without difficulty Saccades: Within functional limits Convergence: Within functional limits Visual Fields: No apparent deficits  Cognition Overall Cognitive Status: Within Functional Limits for tasks assessed  Arousal/Alertness: Awake/alert Orientation Level: Oriented X4 Attention: Alternating Alternating Attention: Appears intact Memory: Appears intact Awareness: Appears intact Problem Solving: Appears intact Safety/Judgment: Appears intact Sensation Sensation Light Touch: Appears Intact Hot/Cold: Appears Intact Proprioception: Appears Intact Coordination Gross Motor Movements are Fluid and Coordinated: No Fine Motor Movements are Fluid and Coordinated: Yes Coordination and Movement Description: limited by pain Motor  Motor Motor: Within Functional Limits Mobility  Bed Mobility Bed Mobility: Supine to Sit Supine to Sit: 5: Supervision Supine to Sit Details: Verbal cues for sequencing Transfers Transfers: Sit to Stand;Stand to Sit Sit to Stand: 5: Supervision;4: Min guard Sit to Stand Details: Verbal cues for sequencing;Verbal cues for precautions/safety;Manual facilitation for weight shifting Stand to Sit: 5: Supervision Stand to Sit Details (indicate cue type and reason): Verbal cues for sequencing  Trunk/Postural Assessment  Cervical Assessment Cervical Assessment: Within Functional Limits Thoracic Assessment Thoracic Assessment: Within Functional Limits Lumbar Assessment Lumbar Assessment: Within Functional Limits Postural Control Postural Control: Deficits on evaluation Protective Responses: delayed  Balance Balance Balance Assessed: Yes Dynamic Sitting Balance Dynamic Sitting - Balance Support: Feet  supported Dynamic Sitting - Level of Assistance: 6: Modified independent (Device/Increase time) Static Standing Balance Static Standing - Balance Support: During functional activity;Bilateral upper extremity supported Static Standing - Level of Assistance: 5: Stand by assistance Dynamic Standing Balance Dynamic Standing - Balance Support: During functional activity Dynamic Standing - Level of Assistance: 5: Stand by assistance Extremity/Trunk Assessment RUE Assessment RUE Assessment: Within Functional Limits (5/5 strength) LUE Assessment LUE Assessment: Within Functional Limits (5/5 strength)  FIM:  FIM - Grooming Grooming Steps: Wash, rinse, dry face;Wash, rinse, dry hands;Brush, comb hair;Shave or apply make-up Grooming: 5: Set-up assist to obtain items FIM - Bathing Bathing Steps Patient Completed: Chest;Right Arm;Left Arm;Abdomen;Left upper leg;Right upper leg;Buttocks;Front perineal area Bathing: 4: Min-Patient completes 8-9 1f10 parts or 75+ percent FIM - Upper Body Dressing/Undressing Upper body dressing/undressing steps patient completed: Thread/unthread right bra strap;Thread/unthread left bra strap;Hook/unhook bra;Thread/unthread right sleeve of pullover shirt/dresss;Thread/unthread left sleeve of pullover shirt/dress;Put head through opening of pull over shirt/dress;Pull shirt over trunk Upper body dressing/undressing: 5: Set-up assist to: Obtain clothing/put away FIM - Lower Body Dressing/Undressing Lower body dressing/undressing steps patient completed: Thread/unthread right underwear leg;Thread/unthread left underwear leg;Pull underwear up/down;Thread/unthread right pants leg;Pull pants up/down;Don/Doff right shoe Lower body dressing/undressing: 3: Mod-Patient completed 50-74% of tasks FIM - Bed/Chair Transfer Bed/Chair Transfer Assistive Devices: WCopy 5: Supine > Sit: Supervision (verbal cues/safety issues);5: Bed > Chair or W/C: Supervision (verbal  cues/safety issues) FIM - TRadio producerDevices: Elevated toilet seat;Walker;Grab bars Toilet Transfers: 5-To toilet/BSC: Supervision (verbal cues/safety issues);5-From toilet/BSC: Supervision (verbal cues/safety issues) FIM - TSystems developerDevices: Shower chair;Walk in shower;Grab bars;Walker Tub/shower Transfers: 5-Into Tub/Shower: Supervision (verbal cues/safety issues);5-Out of Tub/Shower: Supervision (verbal cues/safety issues)   Refer to Care Plan for Long Term Goals  Recommendations for other services: None  Discharge Criteria: Patient will be discharged from OT if patient refuses treatment 3 consecutive times without medical reason, if treatment goals not met, if there is a change in medical status, if patient makes no progress towards goals or if patient is discharged from hospital.  The above assessment, treatment plan, treatment alternatives and goals were discussed and mutually agreed upon: by patient  Karisha Marlin, KQuillian Quince3/15/2016, 9:00 AM

## 2015-03-15 NOTE — Progress Notes (Signed)
  [place] PHYSICAL MEDICINE & REHABILITATION     PROGRESS NOTE    Subjective/Complaints: Knees stiff. Would like more ice. Has questions about pain meds. Not sure if tramadol is enough. Was a little stir crazy last night.  Objective: Vital Signs: Blood pressure 127/61, pulse 78, temperature 99.2 F (37.3 C), temperature source Oral, resp. rate 19, last menstrual period 01/01/2008, SpO2 97 %. No results found.  Recent Labs  03/15/15 0605  WBC 5.7  HGB 9.0*  HCT 26.9*  PLT 297    Recent Labs  03/15/15 0605  NA 135  K 3.4*  CL 96  GLUCOSE 117*  BUN 12  CREATININE 0.54  CALCIUM 9.0   CBG (last 3)  No results for input(s): GLUCAP in the last 72 hours.  Wt Readings from Last 3 Encounters:  02/28/15 67.359 kg (148 lb 8 oz)  01/06/15 67.767 kg (149 lb 6.4 oz)  09/29/14 66.225 kg (146 lb)    Physical Exam:  Constitutional: She is oriented to person, place, and time. She appears well-developed.  HENT:  Head: Normocephalic.  Eyes: EOM are normal.  Neck: Normal range of motion. Neck supple. No thyromegaly present.  Cardiovascular: Normal rate and regular rhythm.  Respiratory: Effort normal and breath sounds normal. No respiratory distress.  GI: Soft. Bowel sounds are normal. She exhibits no distension.  Neurological: She is alert and oriented to person, place, and time.  Skin:  Bilateral knee incisions are dressed appropriately tender  Motor: 5/5 in Bilateral delt, bi, tri, grip Right 3-/5 HF, 2+KE, 4/5 , ankle is 4/5 Left 2- HF, 3- KE 3+ to 4/5 M/S: left knee ROM 60, Right knee ROM 70   Assessment/Plan: 1. Functional deficits secondary to bilateral TKA's which require 3+ hours per day of interdisciplinary therapy in a comprehensive inpatient rehab setting. Physiatrist is providing close team supervision and 24 hour management of active medical problems listed below. Physiatrist and rehab team continue to assess barriers to discharge/monitor patient  progress toward functional and medical goals. FIM:                   Comprehension Comprehension Mode: Auditory Comprehension: 7-Follows complex conversation/direction: With no assist  Expression Expression Mode: Verbal Expression: 7-Expresses complex ideas: With no assist  Social Interaction Social Interaction: 7-Interacts appropriately with others - No medications needed.  Problem Solving Problem Solving: 7-Solves complex problems: Recognizes & self-corrects  Memory Memory: 7-Complete Independence: No helper  Medical Problem List and Plan: 1. 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 DVT Prophylaxis/Anticoagulation: Coumadin for DVT prophylaxis. On for a bleeding episode. Check vascular study 3. Pain Management: Oxycodone for more severe pain. tramadol and Robaxin as needed. Aggressive ice 4. Acute blood loss anemia. hgb 9 5. Neuropsych: This patient is capable of making decisions on her own behalf. 6. Skin/Wound Care: Routine skin checks 7. Fluids/Electrolytes/Nutrition: replace potassium 8. Hypertension. Hygroton25 mg daily, Cozaar 100 mg daily. 9. Constipation. Adjust bowel program. No nausea vomiting. 10. Recurrent UTIs. Continue prophylactic Macrodantin as prior to admission. Follow-up urine study if needed. Foley catheter to removed 11. MRSA PCR screen positive. Maintained on contact precautions LOS (Days) 1 A FACE TO FACE EVALUATION WAS PERFORMED  Khala Tarte T 03/15/2015 7:43 AM

## 2015-03-16 ENCOUNTER — Inpatient Hospital Stay (HOSPITAL_COMMUNITY): Payer: 59 | Admitting: *Deleted

## 2015-03-16 ENCOUNTER — Inpatient Hospital Stay (HOSPITAL_COMMUNITY): Payer: 59 | Admitting: Physical Therapy

## 2015-03-16 ENCOUNTER — Inpatient Hospital Stay (HOSPITAL_COMMUNITY): Payer: 59

## 2015-03-16 MED ORDER — RIVAROXABAN 10 MG PO TABS
10.0000 mg | ORAL_TABLET | Freq: Every day | ORAL | Status: DC
  Administered 2015-03-16 – 2015-03-18 (×3): 10 mg via ORAL
  Filled 2015-03-16 (×5): qty 1

## 2015-03-16 NOTE — Progress Notes (Signed)
Social Work Patient ID: Tara Stanley, female   DOB: 09-Jun-1956, 59 y.o.   MRN: [id number]   Met with pt this afternoon to review team conference.  Pt aware and agreeable with targeted d/c on 3/19 with mod i goals.  Pt still plans to hire private duty assistance for first 4-5 days at home - provided agency list.  Also prefers to start with [facility] therapies - arranged with [place].  Continue to follow.  Lajoyce Tamura, LCSW

## 2015-03-16 NOTE — Progress Notes (Signed)
[place] PHYSICAL MEDICINE & REHABILITATION     PROGRESS NOTE    Subjective/Complaints: Ice bags leaked. Up oob because of sheets needing to be changed. Pain controlled.  Objective: Vital Signs: Blood pressure 115/64, pulse 71, temperature 98.4 F (36.9 C), temperature source Oral, resp. rate 20, last menstrual period 01/01/2008, SpO2 100 %. No results found.  Recent Labs  03/15/15 0605  WBC 5.7  HGB 9.0*  HCT 26.9*  PLT 297    Recent Labs  03/15/15 0605  NA 135  K 3.4*  CL 96  GLUCOSE 117*  BUN 12  CREATININE 0.54  CALCIUM 9.0   CBG (last 3)  No results for input(s): GLUCAP in the last 72 hours.  Wt Readings from Last 3 Encounters:  02/28/15 67.359 kg (148 lb 8 oz)  01/06/15 67.767 kg (149 lb 6.4 oz)  09/29/14 66.225 kg (146 lb)    Physical Exam:  Constitutional: She is oriented to person, place, and time. She appears well-developed.  HENT:  Head: Normocephalic.  Eyes: EOM are normal.  Neck: Normal range of motion. Neck supple. No thyromegaly present.  Cardiovascular: Normal rate and regular rhythm.  Respiratory: Effort normal and breath sounds normal. No respiratory distress.  GI: Soft. Bowel sounds are normal. She exhibits no distension.  Neurological: She is alert and oriented to person, place, and time.  Skin:  Bilateral knee incisions are dressed appropriately tender  Motor: 5/5 in Bilateral delt, bi, tri, grip Right 3-/5 HF, 2+KE, 4/5 , ankle is 4/5 Left 2- HF, 3- KE 3+ to 4/5 M/S: left knee ROM 90, Right knee ROM 95   Assessment/Plan: 1. Functional deficits secondary to bilateral TKA's which require 3+ hours per day of interdisciplinary therapy in a comprehensive inpatient rehab setting. Physiatrist is providing close team supervision and 24 hour management of active medical problems listed below. Physiatrist and rehab team continue to assess barriers to discharge/monitor patient progress toward functional and medical  goals.  -would like cpm to be more aggressive  FIM: FIM - Bathing Bathing Steps Patient Completed: Chest, Right Arm, Left Arm, Abdomen, Left upper leg, Right upper leg, Buttocks, Front perineal area Bathing: 4: Min-Patient completes 8-9 71f 10 parts or 75+ percent  FIM - Upper Body Dressing/Undressing Upper body dressing/undressing steps patient completed: Thread/unthread right bra strap, Thread/unthread left bra strap, Hook/unhook bra, Thread/unthread right sleeve of pullover shirt/dresss, Thread/unthread left sleeve of pullover shirt/dress, Put head through opening of pull over shirt/dress, Pull shirt over trunk Upper body dressing/undressing: 5: Set-up assist to: Obtain clothing/put away FIM - Lower Body Dressing/Undressing Lower body dressing/undressing steps patient completed: Thread/unthread right underwear leg, Thread/unthread left underwear leg, Pull underwear up/down, Thread/unthread right pants leg, Pull pants up/down, Don/Doff right shoe Lower body dressing/undressing: 3: Mod-Patient completed 50-74% of tasks  FIM - Toileting Toileting steps completed by patient: Adjust clothing prior to toileting, Performs perineal hygiene, Adjust clothing after toileting Toileting Assistive Devices: Grab bar or rail for support Toileting: 5: Set-up assist to: Obtain supplies  FIM - Radio producer Devices: Elevated toilet seat, Environmental consultant, Grab bars Toilet Transfers: 5-To toilet/BSC: Supervision (verbal cues/safety issues), 5-From toilet/BSC: Supervision (verbal cues/safety issues)  FIM - Control and instrumentation engineer Devices: Walker, Arm rests Bed/Chair Transfer: 5: Supine > Sit: Supervision (verbal cues/safety issues), 5: Bed > Chair or W/C: Supervision (verbal cues/safety issues), 4: Sit > Supine: Min A (steadying pt. > 75%/lift 1 leg), 5: Chair or W/C > Bed: Supervision (verbal cues/safety issues)  FIM -  Locomotion: Wheelchair Locomotion:  Wheelchair: 0: Activity did not occur (pt ambulatory) FIM - Locomotion: Ambulation Locomotion: Ambulation Assistive Devices: Administrator Ambulation/Gait Assistance: 5: Supervision, 4: Min guard Locomotion: Ambulation: 4: Travels 150 ft or more with minimal assistance (Pt.>75%)  Comprehension Comprehension Mode: Auditory Comprehension: 7-Follows complex conversation/direction: With no assist  Expression Expression Mode: Verbal Expression: 7-Expresses complex ideas: With no assist  Social Interaction Social Interaction: 7-Interacts appropriately with others - No medications needed.  Problem Solving Problem Solving: 7-Solves complex problems: Recognizes & self-corrects  Memory Memory: 7-Complete Independence: No helper  Medical Problem List and Plan: 1. 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 DVT Prophylaxis/Anticoagulation: Change to xarelto for dvt proph. Doppler negative 3. Pain Management: Oxycodone for more severe pain. tramadol and Robaxin as needed. Aggressive ice 4. Acute blood loss anemia. hgb 9 5. Neuropsych: This patient is capable of making decisions on her own behalf. 6. Skin/Wound Care: Routine skin checks 7. Fluids/Electrolytes/Nutrition: replace potassium 8. Hypertension. Hygroton25 mg daily, Cozaar 100 mg daily. 9. Constipation. Adjust bowel program. No nausea vomiting. 10. Recurrent UTIs. Continue prophylactic Macrodantin as prior to admission. Follow-up urine study if needed.   -voiding regularly, afebrile 11. MRSA PCR screen positive. Maintained on contact precautions LOS (Days) 2 A FACE TO FACE EVALUATION WAS PERFORMED  SWARTZ,ZACHARY T 03/16/2015 7:26 AM

## 2015-03-16 NOTE — Progress Notes (Signed)
Occupational Therapy Session Note  Patient Details  Name: Kasia Trego MRN: [id number] Date of Birth: 07-07-1956  Today's Date: 03/16/2015 OT Individual Time: 0700-0800 OT Individual Time Calculation (min): 60 min    Short Term Goals: Week 1:  OT Short Term Goal 1 (Week 1): Focus on LTGs due to short ELOS  Skilled Therapeutic Interventions/Progress Updates:    Pt resting in recliner upon arrival and ready for bathing at shower level.  Pt amb with RW to bathroom and completed all bathing tasks with sit<>stand from shower seat.  Pt returned to room and completed dressing tasks with sit<>stand from EOB.  Pt required assistance with TED hose but was able to don and fasten shoes this morning without assistance. Pt completed grooming tasks including applying makeup with sit<>stand at sink.  Pt completed all tasks this morning without assistance.  Focus on activity tolerance, sit<>stand, standing balance, functional amb with RW, discharge planning, and safety awareness.  Therapy Documentation Precautions:  Precautions Precautions: Fall Restrictions Weight Bearing Restrictions: Yes RLE Weight Bearing: Weight bearing as tolerated LLE Weight Bearing: Weight bearing as tolerated Other Position/Activity Restrictions: WBAT  Pain: Pain Assessment Pain Assessment: No/denies pain Pain Score: 4  Pain Type: Surgical pain Pain Location: Knee Pain Orientation: Right;Left Pain Descriptors / Indicators: Aching Pain Frequency: Constant Pain Onset: On-going Pain Intervention(s):RN aware; Repositioned  See FIM for current functional status  Therapy/Group: Individual Therapy  Leroy Libman 03/16/2015, 8:03 AM

## 2015-03-16 NOTE — Patient Care Conference (Signed)
Inpatient RehabilitationTeam Conference and Plan of Care Update Date: 3/56/2016   Time: 2:45 PM    Patient Name: Tara Stanley      Medical Record Number: [id number]  Date of Birth: February 01, 1956 Sex: Female         Room/Bed: 4M05C/4M05C-01 Payor Info: Payor: Theme park manager / Plan: [place] / Product Type: *No Product type* /    Admitting Diagnosis: B TKR  Admit Date/Time:  03/14/2015  1:43 PM Admission Comments: No comment available   Primary Diagnosis:  <principal problem not specified> Principal Problem: <principal problem not specified>  Patient Active Problem List   Diagnosis Date Noted  . Status post bilateral knee replacements 03/14/2015  . OA (osteoarthritis) of knee 03/09/2015  . Preoperative cardiovascular examination 01/06/2015  . Hypertension 02/17/2012  . Multinodular goiter 02/17/2012  . Spinal stenosis of lumbar region 02/17/2012  . 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 Menopause 08/18/2010  . BP (high blood pressure) 08/19/2007  . Malignant melanoma 08/19/2007  . Adult hypothyroidism 08/19/2003  . Arthritis of knee, degenerative 11/25/1999    Expected Discharge Date: Expected Discharge Date: 03/19/15  Team Members Present: Physician leading conference: Dr. Alger Simons Social Worker Present: Lennart Pall, LCSW Nurse Present: Elliot Cousin, RN PT Present: Carney Living, PT;Bridgett Ripa, PT OT Present: Roanna Epley, COTA SLP Present: Weston Anna, SLP Other (Discipline and Name): Danne Baxter, RN [facility]) PPS Coordinator present : Daiva Nakayama, RN, CRRN     Current Status/Progress Goal Weekly Team Focus  Medical   bilateral tka's, working on ROM  pain mgt  pain, knee rom, balance   Bowel/Bladder   Continent to bladder and bowel.LBM on 3/14.  To continue continent to B&B.  To monitor bowel and bladder function Q shift.   Swallow/Nutrition/ Hydration             ADL's   min-supervision  Mod I  standing  balance, functional mobility, use of AE, activity tolerance, strengthening, education    Mobility   supervision-min guard using RW gait up to 150 ft and 4" steps using 2 rails, min A bed mobility  mod I   functional mobility, knee ROM/strengthening, pain management, activity tolerance, pt/family education   Communication             Safety/Cognition/ Behavioral Observations     To keep pt. free from falls during her stay in rehab.  To assess pt. needs closely and keep rounding hourly.   Pain   Pain under control with PRN tramadol.  To keep pain level less than 3 on scale 1 to 10.  To monitor pain levels Q 2 hrs. and with activity.   Skin   Bilateral knee incisions dry and intact with steri strip on.  To keep incisions dry and intact.  To monitor skin condition Q shift.    Rehab Goals Patient on target to meet rehab goals: Yes *See Care Plan and progress notes for long and short-term goals.  Barriers to Discharge: knee strength rom    Possible Resolutions to Barriers:  CPM, strength training    Discharge Planning/Teaching Needs:  home with husband who can provide intermittent assistance.  May arrange private duty if warranted      Team Discussion:  New eval. Very motivated and already flex @ 90 degrees.  Mod i goals.  No concerns.  Revisions to Treatment Plan:  None   Continued Need for Acute Rehabilitation Level of Care: The patient requires daily medical management  by a physician with specialized training in physical medicine and rehabilitation for the following conditions: Daily direction of a multidisciplinary physical rehabilitation program to ensure safe treatment while eliciting the highest outcome that is of practical value to the patient.: Yes Daily medical management of patient stability for increased activity during participation in an intensive rehabilitation regime.: Yes Daily analysis of laboratory values and/or radiology reports with any subsequent need for  medication adjustment of medical intervention for : Post surgical problems;Other  Tara Stanley 03/16/2015, 1:21 PM

## 2015-03-16 NOTE — Progress Notes (Signed)
Patient refused ice pack to B/L knee at 0200.

## 2015-03-16 NOTE — Progress Notes (Signed)
Physical Therapy Session Note  Patient Details  Name: Tara Stanley MRN: [id number] Date of Birth: March 05, 1956  Today's Date: 03/16/2015 PT Individual Time: 0900-1000 PT Individual Time Calculation (min): 60 min   Short Term Goals: Week 1:  PT Short Term Goal 1 (Week 1): = LTGs of overall mod I due to ELOS  Skilled Therapeutic Interventions/Progress Updates:  Pt received sitting in recliner; agreeable to therapy. Treatment focused on functional ambulation, stair negotiation, and bilateral knee AROM. Pt performed >1107ft x2 functional ambulation min guard/supervision with RW, with cues for greater step length and knee flexion. Pt negotiated 12 stairs with bilateral handrails min guard and cues for step-to pattern.  Multiple rest breaks required throughout session (dictated by SPT).  Pt performed seated knee flexion with pillow case, with cues for symmetrical posture, avoiding lateral tilts, and supine knee extension (pt complained of stretch/soreness/stiffness during this exercise).  Pt left in recliner with all needs within reach.    Knee AROM:  Knee Flexion: R: 92 degrees; L: 91 degrees Knee Extension: R: lacking 11 degrees; L: lacking 15 degrees      Therapy Documentation Precautions:  Precautions Precautions: Fall Restrictions Weight Bearing Restrictions: Yes RLE Weight Bearing: Weight bearing as tolerated LLE Weight Bearing: Weight bearing as tolerated Other Position/Activity Restrictions: WBAT Pain: Pain Assessment Pain Assessment: 0-10 Pain Score: 5  Pain Location: Knee Pain Orientation: Right;Left Pain Descriptors / Indicators: Sore Pain Onset: With Activity Locomotion : Ambulation Ambulation/Gait Assistance: 4: Min guard;5: Supervision    See FIM for current functional status  Therapy/Group: Individual Therapy  Bennie Chirico 03/16/2015, 12:18 PM

## 2015-03-16 NOTE — Progress Notes (Signed)
Social Work  Social Work Assessment and Plan  Patient Details  Name: Tara Stanley MRN: [id number] Date of Birth: 1956-12-15  Today's Date: 03/16/2015  Problem List:  Patient Active Problem List   Diagnosis Date Noted  . Status post bilateral knee replacements 03/14/2015  . OA (osteoarthritis) of knee 03/09/2015  . Preoperative cardiovascular examination 01/06/2015  . Hypertension 02/17/2012  . Multinodular goiter 02/17/2012  . Spinal stenosis of lumbar region 02/17/2012  . 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 Menopause 08/18/2010  . BP (high blood pressure) 08/19/2007  . Malignant melanoma 08/19/2007  . Adult hypothyroidism 08/19/2003  . Arthritis of knee, degenerative 11/25/1999   Past Medical History:  Past Medical History  Diagnosis Date  . Hypertension   . Thyroid disease      nodule, hypothyroidism pt was on synthyroid but is not having to take currently pt states her MD states she does not have issues with   . Spinal stenosis     mild hx of herniated disc  . Arthritis     osteoarthritis  . Cancer     melanoma- right upper arm  . Osteomyelitis     left leg  . Renal cyst     benign, found on CT scan, is being followed  . History of frequent urinary tract infections     on prophalaytic medication    Past Surgical History:  Past Surgical History  Procedure Laterality Date  . Appendectomy  03/2011    laparoscopic  . Repair of rt med meniscal tear    . Skin cancer excision      right upper arm  . Left tibia      for osteomelitis of left tibia at age 59  . Colonscopy     . Total knee arthroplasty Bilateral 03/09/2015    Procedure: BILATERAL TOTAL KNEE ARTHROPLASTY;  Surgeon: Gaynelle Arabian, MD;  Location: [facility];  Service: Orthopedics;  Laterality: Bilateral;   Social History:  reports that she quit smoking about 21 years ago. Her smoking use included Cigarettes. She has a 40 pack-year smoking history. She has never used smokeless  tobacco. She reports that she drinks about 1.2 oz of alcohol per week. She reports that she uses illicit drugs. (note, this was not reported to this SW)  Family / Support Systems Marital Status: Married Patient Roles: Spouse, Parent, Volunteer Spouse/Significant Other: Teasia Zapf @ (H) [phone] or [phone]  Children: two adult daughters living in [place] and [place] Anticipated Caregiver: self, spouse and may hire NA help. Ability/Limitations of Caregiver: Husband works and has to travel out of town for his job. Caregiver Availability: Intermittent Family Dynamics: Pt notes husband very supportive.  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  Admits frustration with being limited physically by her knees for over a year now (i.e. not being able to walk around the block).  Optimistic that having the surgery will drastically change her lifestyle to a very active one that she is used to.  No s/s of any emotional distress - will monitor.  Recent Psychosocial Issues: None Pyschiatric History: None Substance Abuse History: None  Patient / Family Perceptions, Expectations & Goals Pt/Family understanding of illness & functional limitations: Pt and family with very good understanding of the surgery and her current functional limitations/ need for CIR. Premorbid pt/family roles/activities: Pt was completely independent at home and community, however,  her execise activity had been significantly limited. Anticipated changes in roles/activities/participation: little change anticipated as her goals have been set for mod i  Pt/family expectations/goals: Pt's goals are to be able to "get back outside" and increase her exercise level  [employer]: None Premorbid Home Care/DME Agencies: None Transportation available at discharge: yes  Discharge Planning Living Arrangements: Spouse/significant other Support Systems: Spouse/significant other, Friends/neighbors Type of Residence: Private residence Administrator, sports: Multimedia programmer (specify) Sports administrator) Financial Resources:  (husband's employment) Financial Screen Referred: No Living Expenses: Own Money Management: Spouse Does the patient have any problems obtaining your medications?: No Home Management: shared btwn pt and spouse Patient/Family Preliminary Plans: Pt to return to her home with husband providing intermittent support. Social Work Anticipated Follow Up Needs: HH/OP Expected length of stay: 7 days  Clinical Impression Very pleasant woman here following bil TKRs.  Motivated and hopeful for short LOS and longer term goals to resume very active exercise lifesytyle.  No s/s of any emotional distress noted/ reported.  Will follow for d/c planning assistance.  Sharnice Bosler 03/16/2015, 9:38 AM

## 2015-03-16 NOTE — Progress Notes (Addendum)
Physical Therapy Session Note  Patient Details  Name: Tara Stanley MRN: [id number] Date of Birth: 03-Aug-1956  Today's Date: 03/16/2015 PT Individual Time: 1105-1205 PT Individual Time Calculation (min): 60 min   Short Term Goals: Week 1:  PT Short Term Goal 1 (Week 1): = LTGs of overall mod I due to ELOS  Skilled Therapeutic Interventions/Progress Updates:   Patient received sitting in recliner with BLE elevated. Session focused on community/outdoor ambulation using RW, car transfers, therex, and initiation of HEP. Patient performed 10 MWT using RW = 0.71 m/s and 5TSS with UE support = 25 sec. Patient ambulated using RW throughout rehab unit, on/off elevators, in hospital lobby and throughout carpeted gift shop negotiating tight spaces, outdoors on brick and concrete surfaces, over thresholds, inclines/declines, and up/down 8 brick steps using R rail ascending with supervision, 2 x > 1000 ft with 1 seated rest break. Patient with reciprocal pattern ascending and step-to pattern descending for stair negotiation. Patient performed car transfer to sedan and SUV height using RW with supervision and verbal cues for technique/safety. For B knee ROM, patient performed NuStep using BUE/BLE at level 1 x 10 min with slow pace. Patient provided with HEP for supine/sitting BLE therex and reviewed exercises (patient has handout in [employer]). Patient left sitting in recliner with BLE elevated, call bell within reach.  Therapy Documentation Precautions:   Precautions Precautions: Fall Restrictions Weight Bearing Restrictions: Yes RLE Weight Bearing: Weight bearing as tolerated LLE Weight Bearing: Weight bearing as tolerated Other Position/Activity Restrictions: WBAT Pain: Pain Assessment Pain Assessment: 0-10 Pain Score: 5  Pain Location: Knee Pain Orientation: Right;Left Pain Descriptors / Indicators: Sore Pain Onset: With Activity Locomotion : Ambulation Ambulation/Gait  Assistance: 5: Supervision   See FIM for current functional status  Therapy/Group: Individual Therapy  Laretta Alstrom 03/16/2015, 12:18 PM

## 2015-03-16 NOTE — Discharge Instructions (Addendum)
Inpatient Rehab Discharge Instructions  Tara Stanley Discharge date and time: No discharge date for patient encounter.   Activities/Precautions/ Functional Status: Activity: activity as tolerated Diet: regular diet Wound Care: keep wound clean and dry Functional status:  ___ No restrictions     ___ Walk up steps independently ___ 24/7 supervision/assistance   ___ Walk up steps with assistance ___ Intermittent supervision/assistance  ___ Bathe/dress independently ___ Walk with walker     ___ Bathe/dress with assistance ___ Walk Independently    ___ Shower independently _x__ Walk with assistance    ___ Shower with assistance ___ No alcohol     ___ Return to work/school ________    COMMUNITY REFERRALS UPON DISCHARGE:    Home Health:   PT                      Agency: [facility] Phone: [phone]    Medical Equipment/Items Ordered: rolling walker, bedside commode                                                     Agency/Supplier:  [facility] @ [phone]      Special Instructions: Continue XARELTO daily until 04/09/2015  and then begin aspirin 81 mg daily 3 weeks and stop   Follow-up with PCP on resuming antihypertensive medication at their discretion  My questions have been answered and I understand these instructions. I will adhere to these goals and the provided educational materials after my discharge from the hospital.  Patient/Caregiver Signature _______________________________ Date __________  Clinician Signature _______________________________________ Date __________  Please bring this form and your medication list with you to all your follow-up doctor's appointments.   Information on my medicine - XARELTO (Rivaroxaban)  This medication education was reviewed with me or my healthcare representative as part of my discharge preparation.   Why was Xarelto prescribed for you? Xarelto was prescribed for you to reduce the risk of blood clots  forming after orthopedic surgery. The medical term for these abnormal blood clots is venous thromboembolism (VTE).  What do you need to know about xarelto ? Take your Xarelto ONCE DAILY at the same time every day. You may take it either with or without food.  If you have difficulty swallowing the tablet whole, you may crush it and mix in applesauce just prior to taking your dose.  Take Xarelto exactly as prescribed by your doctor and DO NOT stop taking Xarelto without talking to the doctor who prescribed the medication.  Stopping without other VTE prevention medication to take the place of Xarelto may increase your risk of developing a clot.  After discharge, you should have regular check-up appointments with your healthcare provider that is prescribing your Xarelto.    What do you do if you miss a dose? If you miss a dose, take it as soon as you remember on the same day then continue your regularly scheduled once daily regimen the next day. Do not take two doses of Xarelto on the same day.   Important Safety Information A possible side effect of Xarelto is bleeding. You should call your healthcare provider right away if you experience any of the following: ? Bleeding from an injury or your nose that does not stop. ? Unusual colored urine (red or dark brown) or unusual colored stools (red  or black). ? Unusual bruising for unknown reasons. ? A serious fall or if you hit your head (even if there is no bleeding).  Some medicines may interact with Xarelto and might increase your risk of bleeding while on Xarelto. To help avoid this, consult your healthcare provider or pharmacist prior to using any new prescription or non-prescription medications, including herbals, vitamins, non-steroidal anti-inflammatory drugs (NSAIDs) and supplements.  This website has more information on Xarelto: [email or web address]

## 2015-03-17 ENCOUNTER — Inpatient Hospital Stay (HOSPITAL_COMMUNITY): Payer: 59

## 2015-03-17 NOTE — Progress Notes (Signed)
Orthopedic Tech Progress Note Patient Details:  Tara Stanley [birth year] [id number] On cpm at 5:40 pm Patient ID: Tara Stanley, female   DOB: 1956-02-26, 59 y.o.   MRN: [id number]   Braulio Bosch 03/17/2015, 5:43 PM

## 2015-03-17 NOTE — Progress Notes (Signed)
Physical Therapy Session Note  Patient Details  Name: Tara Stanley MRN: [id number] Date of Birth: 06-Apr-1956  Today's Date: 03/17/2015 PT Individual Time: 1100-1200 PT Individual Time Calculation (min): 60 min   Short Term Goals: Week 1:  PT Short Term Goal 1 (Week 1): = LTGs of overall mod I due to ELOS  Skilled Therapeutic Interventions/Progress Updates:    Pt received seated in recliner, agreeable to participate in therapy. Session focused on B knee ROM/strength, functional mobility. Pt ambulated around rehab unit >200' w/ distant S, cueing for increased heel strike, upright posture, and knee flexion during gait. Discussed car transfer w/ pt, pt feels that 4 door sedan will be too low to stand up from. Recommend pt ride in SUV with technique of backing up to car, stepping up on running board backwards and then sitting down. Pt practiced technique and satisfied and agrees with recommendation. Instructed pt in ascending/descending flight of stairs w/ B rail and step over step on way up and step to on way down w/ overall supervision. Instructed pt in x10 heel slides and quad sets in supine with pt achieving 110 degrees flexion on R. Instructed pt in standing from elevated table w/ graded UE support to address quad strength bilaterally. Pt left seated in recliner w/ all needs within reach, tolerated session well.   Therapy Documentation Precautions:  Precautions Precautions: Fall Restrictions Weight Bearing Restrictions: Yes RLE Weight Bearing: Weight bearing as tolerated LLE Weight Bearing: Weight bearing as tolerated Other Position/Activity Restrictions: WBAT Pain: Pain Assessment Pain Assessment: No/denies pain  See FIM for current functional status  Therapy/Group: Individual Therapy  Rada Hay  Rada Hay, PT, DPT 03/17/2015, 7:35 AM

## 2015-03-17 NOTE — Progress Notes (Signed)
Physical Therapy Session Note  Patient Details  Name: Tara Stanley MRN: [id number] Date of Birth: Jun 09, 1956  Today's Date: 03/17/2015 PT Individual Time: 0805-0910 PT Individual Time Calculation (min): 65 min   Short Term Goals: Week 1:  PT Short Term Goal 1 (Week 1): = LTGs of overall mod I due to ELOS  Skilled Therapeutic Interventions/Progress Updates:  Patient sitting in recliner eating breakfast upon entering room. Patient sit to stand and ambulated with rolling walker 150 feet with supervision and occasional cueing to increase knee flexion. Patient used Nustep on level 5 x 8 minutes working on terminal extension and flexion. Patient ambulated up and down 16 steps with bilateral rails using step over step with supervision/cueing. Patient demonstrated HEP for bilateral LE ROM and strength in supine and sitting x 12 - 20 reps each. Patient's movements are slow. R knee ROM -12 to 94 degrees. L ROM -8 to 100 degrees. Patient ambulated 150 feet back to room with rolling walker and was left in room with all items in reach.  Therapy Documentation Precautions:  Precautions Precautions: Fall Restrictions Weight Bearing Restrictions: Yes RLE Weight Bearing: Weight bearing as tolerated LLE Weight Bearing: Weight bearing as tolerated Other Position/Activity Restrictions: WBAT Pain: Pain Assessment Pain Assessment: 0-10 Faces Pain Scale: No hurt Locomotion : Ambulation Ambulation/Gait Assistance: 5: Supervision   See FIM for current functional status  Therapy/Group: Individual Therapy  Elder Love M 03/17/2015, 10:19 AM

## 2015-03-17 NOTE — IPOC Note (Signed)
Overall Plan of Care [facility]) Patient Details Name: Tara Stanley MRN: [id number] DOB: [birth year]  Admitting Diagnosis: B TKR  Hospital Problems: Active Problems:   Status post bilateral knee replacements     Functional Problem List: Nursing    PT Balance, Edema, Endurance, Motor, Nutrition, Pain, Safety, Skin Integrity  OT Balance, Pain, Safety, Edema, Endurance, Motor  SLP    TR         Basic ADL's: OT Grooming, Bathing, Dressing, Toileting     Advanced  ADL's: OT Simple Meal Preparation     Transfers: PT Bed Mobility, Bed to Chair, Car, Patent attorney, Agricultural engineer: PT Ambulation, Emergency planning/management officer, Stairs     Additional Impairments: OT None  SLP        TR      Anticipated Outcomes Item Anticipated Outcome  Self Feeding n/a  Swallowing      Basic self-care  Mod I  Toileting  Mod I   Bathroom Transfers Mod I  Bowel/Bladder     Transfers  mod I  Locomotion  mod I ambulator  Communication     Cognition     Pain     Safety/Judgment      Therapy Plan: PT Intensity: Minimum of 1-2 x/day ,45 to 90 minutes PT Frequency: 5 out of 7 days PT Duration Estimated Length of Stay: 5-7 days OT Intensity: Minimum of 1-2 x/day, 45 to 90 minutes OT Frequency: 5 out of 7 days OT Duration/Estimated Length of Stay: 5-7 days         Team Interventions: Nursing Interventions    PT interventions Ambulation/gait training, Training and development officer, [employer] reintegration, Discharge planning, DME/adaptive equipment instruction, Functional mobility training, Neuromuscular re-education, Pain management, Patient/family education, Psychosocial support, Stair training, Therapeutic Activities, Therapeutic Exercise, UE/LE Strength taining/ROM, UE/LE Coordination activities, Wheelchair propulsion/positioning  OT Interventions Training and development officer, Discharge planning, Community reintegration, Engineer, drilling, Functional  mobility training, Pain management, Patient/family education, Psychosocial support, Self Care/advanced ADL retraining, Skin care/wound managment, Therapeutic Activities, Therapeutic Exercise, Splinting/orthotics, UE/LE Strength taining/ROM, UE/LE Coordination activities, Wheelchair propulsion/positioning  SLP Interventions    TR Interventions    SW/CM Interventions Discharge Planning, Psychosocial Support, Patient/Family Education    Team Discharge Planning: Destination: PT-Home ,OT- Home , SLP-  Projected Follow-up: PT-Outpatient PT, OT-  None, SLP-  Projected Equipment Needs: PT-Rolling walker with 5" wheels, OT- 3 in 1 bedside comode, SLP-  Equipment Details: PT- , OT-  Patient/family involved in discharge planning: PT- Patient,  OT-Patient, SLP-   MD ELOS: 7-10d Medical Rehab Prognosis:  Good Assessment: 59 y.o. right handed female with history of hypertension. Patient independent prior to admission living with her husband and active. Presented 03/08/2015 with end-stage osteoarthritis bilateral knees. No change with conservative care including activity modification. Underwent bilateral total knee arthroplasty 03/09/2015 per Dr.Alusio.   Now requiring 24/7 Rehab RN,MD, as well as CIR level PT, OT .  Treatment team will focus on ADLs and mobility with goals set at Mod I   See Team Conference Notes for weekly updates to the plan of care

## 2015-03-17 NOTE — Progress Notes (Signed)
[facility] PHYSICAL MEDICINE & REHABILITATION     PROGRESS NOTE    Subjective/Complaints: Had a much better night.  Objective: Vital Signs: Blood pressure 121/63, pulse 74, temperature 99.2 F (37.3 C), temperature source Oral, resp. rate 18, last menstrual period 01/01/2008, SpO2 100 %. No results found.  Recent Labs  03/15/15 0605  WBC 5.7  HGB 9.0*  HCT 26.9*  PLT 297    Recent Labs  03/15/15 0605  NA 135  K 3.4*  CL 96  GLUCOSE 117*  BUN 12  CREATININE 0.54  CALCIUM 9.0   CBG (last 3)  No results for input(s): GLUCAP in the last 72 hours.  Wt Readings from Last 3 Encounters:  02/28/15 67.359 kg (148 lb 8 oz)  01/06/15 67.767 kg (149 lb 6.4 oz)  09/29/14 66.225 kg (146 lb)    Physical Exam:  Constitutional: She is oriented to person, place, and time. She appears well-developed.  HENT:  Head: Normocephalic.  Eyes: EOM are normal.  Neck: Normal range of motion. Neck supple. No thyromegaly present.  Cardiovascular: Normal rate and regular rhythm.  Respiratory: Effort normal and breath sounds normal. No respiratory distress.  GI: Soft. Bowel sounds are normal. She exhibits no distension.  Neurological: She is alert and oriented to person, place, and time.  Skin:  Bilateral knee incisions are clean/dry  Motor: 5/5 in Bilateral delt, bi, tri, grip Right 3-/5 HF, 2+KE, 4/5 , ankle is 4/5 Left 2- HF, 3- KE 3+ to 4/5 M/S: left knee ROM 95, Right knee ROM 95+   Assessment/Plan: 1. Functional deficits secondary to bilateral TKA's which require 3+ hours per day of interdisciplinary therapy in a comprehensive inpatient rehab setting. Physiatrist is providing close team supervision and 24 hour management of active medical problems listed below. Physiatrist and rehab team continue to assess barriers to discharge/monitor patient progress toward functional and medical goals.  -will not need home cpm  FIM: FIM - Bathing Bathing Steps Patient  Completed: Chest, Right Arm, Left Arm, Abdomen, Left upper leg, Right upper leg, Buttocks, Front perineal area, Right lower leg (including foot), Left lower leg (including foot) Bathing: 5: Supervision: Safety issues/verbal cues  FIM - Upper Body Dressing/Undressing Upper body dressing/undressing steps patient completed: Thread/unthread right bra strap, Thread/unthread left bra strap, Hook/unhook bra, Thread/unthread right sleeve of pullover shirt/dresss, Thread/unthread left sleeve of pullover shirt/dress, Put head through opening of pull over shirt/dress, Pull shirt over trunk Upper body dressing/undressing: 5: Set-up assist to: Obtain clothing/put away FIM - Lower Body Dressing/Undressing Lower body dressing/undressing steps patient completed: Thread/unthread right underwear leg, Thread/unthread left underwear leg, Pull underwear up/down, Thread/unthread right pants leg, Pull pants up/down, Don/Doff right shoe, Thread/unthread left pants leg, Don/Doff left shoe, Fasten/unfasten right shoe, Fasten/unfasten left shoe Lower body dressing/undressing: 5: Supervision: Safety issues/verbal cues  FIM - Toileting Toileting steps completed by patient: Adjust clothing prior to toileting, Performs perineal hygiene, Adjust clothing after toileting Toileting Assistive Devices: Grab bar or rail for support Toileting: 5: Set-up assist to: Obtain supplies  FIM - Radio producer Devices: Elevated toilet seat, Environmental consultant, Grab bars Toilet Transfers: 5-To toilet/BSC: Supervision (verbal cues/safety issues), 5-From toilet/BSC: Supervision (verbal cues/safety issues)  FIM - Control and instrumentation engineer Devices: Walker, Arm rests Bed/Chair Transfer: 5: Bed > Chair or W/C: Supervision (verbal cues/safety issues), 5: Chair or W/C > Bed: Supervision (verbal cues/safety issues)  FIM - Locomotion: Wheelchair Locomotion: Wheelchair: 0: Activity did not occur FIM -  Locomotion: Ambulation Locomotion: Ambulation  Assistive Devices: Administrator Ambulation/Gait Assistance: 5: Supervision Locomotion: Ambulation: 5: Travels 150 ft or more with supervision/safety issues  Comprehension Comprehension Mode: Auditory Comprehension: 7-Follows complex conversation/direction: With no assist  Expression Expression Mode: Verbal Expression: 7-Expresses complex ideas: With no assist  Social Interaction Social Interaction: 7-Interacts appropriately with others - No medications needed.  Problem Solving Problem Solving: 7-Solves complex problems: Recognizes & self-corrects  Memory Memory: 7-Complete Independence: No helper  Medical Problem List and Plan: 1. 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 DVT Prophylaxis/Anticoagulation: Change to xarelto for dvt proph. Doppler negative 3. Pain Management: Oxycodone for more severe pain. tramadol and Robaxin as needed. Aggressive ice 4. Acute blood loss anemia. hgb 9 5. Neuropsych: This patient is capable of making decisions on her own behalf. 6. Skin/Wound Care: Routine skin checks 7. Fluids/Electrolytes/Nutrition: replace potassium 8. Hypertension. Hygroton25 mg daily, Cozaar 100 mg daily. 9. Constipation. Adjust bowel program. No nausea vomiting. 10. Recurrent UTIs. Continue prophylactic Macrodantin as prior to admission. Follow-up urine study if needed.   -voiding regularly, afebrile 11. MRSA PCR screen positive. Maintained on contact precautions LOS (Days) 3 A FACE TO FACE EVALUATION WAS PERFORMED  SWARTZ,ZACHARY T 03/17/2015 7:54 AM

## 2015-03-17 NOTE — Progress Notes (Signed)
Occupational Therapy Session Note  Patient Details  Name: Tara Stanley MRN: [id number] Date of Birth: November 01, 1956  Today's Date: 03/17/2015 OT Individual Time: 0700-0800 OT Individual Time Calculation (min): 60 min    Short Term Goals: Week 1:  OT Short Term Goal 1 (Week 1): Focus on LTGs due to short ELOS  Skilled Therapeutic Interventions/Progress Updates:    Pt resting in recliner and "ready to get going." Pt amb with RW in room to gather supplies before entering bathroom for toileting and bathing at shower level with sit<>stand from shower seat.  Pt completed dressing with sit<>stand from EOB and recliner, including donning TED hose.  Pt navigated safely through furniture in room and exhibited no safety concerns.  Pt completed grooming at sink.  Focus on activity tolerance, sit<>stand, functional ambulation, discharge planning, and safety awareness.  Therapy Documentation Precautions:  Precautions Precautions: Fall Restrictions Weight Bearing Restrictions: Yes RLE Weight Bearing: Weight bearing as tolerated LLE Weight Bearing: Weight bearing as tolerated Other Position/Activity Restrictions: WBAT Pain: Pain Assessment Pain Assessment: No/denies pain (medicated prior to therapy)  See FIM for current functional status  Therapy/Group: Individual Therapy  Leroy Libman 03/17/2015, 8:16 AM

## 2015-03-18 ENCOUNTER — Inpatient Hospital Stay (HOSPITAL_COMMUNITY): Payer: 59 | Admitting: *Deleted

## 2015-03-18 ENCOUNTER — Inpatient Hospital Stay (HOSPITAL_COMMUNITY): Payer: 59

## 2015-03-18 MED ORDER — NITROFURANTOIN MONOHYD MACRO 100 MG PO CAPS
100.0000 mg | ORAL_CAPSULE | Freq: Every day | ORAL | Status: DC
  Administered 2015-03-18: 100 mg via ORAL
  Filled 2015-03-18 (×2): qty 1

## 2015-03-18 MED ORDER — TRAMADOL HCL 50 MG PO TABS: 50.0000 mg | ORAL_TABLET | Freq: Four times a day (QID) | ORAL | Status: DC | PRN

## 2015-03-18 MED ORDER — NITROFURANTOIN MACROCRYSTAL 50 MG PO CAPS: 50.0000 mg | ORAL_CAPSULE | Freq: Every day | ORAL | Status: DC

## 2015-03-18 MED ORDER — OXYCODONE HCL 5 MG PO TABS: 5.0000 mg | ORAL_TABLET | Freq: Four times a day (QID) | ORAL | Status: DC | PRN

## 2015-03-18 MED ORDER — RIVAROXABAN 10 MG PO TABS: ORAL_TABLET | ORAL | Status: DC

## 2015-03-18 MED ORDER — METHOCARBAMOL 500 MG PO TABS: 500.0000 mg | ORAL_TABLET | Freq: Four times a day (QID) | ORAL | Status: DC | PRN

## 2015-03-18 NOTE — Progress Notes (Signed)
[facility] PHYSICAL MEDICINE & REHABILITATION     PROGRESS NOTE    Subjective/Complaints: Experiencing some leg swelling at the end of the day. Some pain. Using regular ice, wearing TEDS.   Objective: Vital Signs: Blood pressure 107/57, pulse 68, temperature 98.4 F (36.9 C), temperature source Oral, resp. rate 18, last menstrual period 01/01/2008, SpO2 100 %. No results found. No results for input(s): WBC, HGB, HCT, PLT in the last 72 hours. No results for input(s): NA, K, CL, GLUCOSE, BUN, CREATININE, CALCIUM in the last 72 hours.  Invalid input(s): CO CBG (last 3)  No results for input(s): GLUCAP in the last 72 hours.  Wt Readings from Last 3 Encounters:  02/28/15 67.359 kg (148 lb 8 oz)  01/06/15 67.767 kg (149 lb 6.4 oz)  09/29/14 66.225 kg (146 lb)    Physical Exam:  Constitutional: She is oriented to person, place, and time. She appears well-developed.  HENT:  Head: Normocephalic.  Eyes: EOM are normal.  Neck: Normal range of motion. Neck supple. No thyromegaly present.  Cardiovascular: Normal rate and regular rhythm.  Respiratory: Effort normal and breath sounds normal. No respiratory distress.  GI: Soft. Bowel sounds are normal. She exhibits no distension.  Neurological: She is alert and oriented to person, place, and time.  Skin:  Bilateral knee incisions are clean/dry  Motor: 5/5 in Bilateral delt, bi, tri, grip Right 3-/5 HF, 2+KE, 4/5 , ankle is 4/5 Left 2- HF, 3- KE 3+ to 4/5 M/S: left knee ROM 95-100, Right knee ROM 95-105   Assessment/Plan: 1. Functional deficits secondary to bilateral TKA's which require 3+ hours per day of interdisciplinary therapy in a comprehensive inpatient rehab setting. Physiatrist is providing close team supervision and 24 hour management of active medical problems listed below. Physiatrist and rehab team continue to assess barriers to discharge/monitor patient progress toward functional and medical goals.  -will not  need home cpm  -HH therapy  -dc in AM after rounds  FIM: FIM - Bathing Bathing Steps Patient Completed: Chest, Right Arm, Left Arm, Abdomen, Left upper leg, Right upper leg, Buttocks, Front perineal area, Right lower leg (including foot), Left lower leg (including foot) Bathing: 5: Supervision: Safety issues/verbal cues  FIM - Upper Body Dressing/Undressing Upper body dressing/undressing steps patient completed: Thread/unthread right bra strap, Thread/unthread left bra strap, Hook/unhook bra, Thread/unthread right sleeve of pullover shirt/dresss, Thread/unthread left sleeve of pullover shirt/dress, Put head through opening of pull over shirt/dress, Pull shirt over trunk Upper body dressing/undressing: 7: Complete Independence: No helper FIM - Lower Body Dressing/Undressing Lower body dressing/undressing steps patient completed: Thread/unthread right underwear leg, Thread/unthread left underwear leg, Pull underwear up/down, Thread/unthread right pants leg, Pull pants up/down, Don/Doff right shoe, Thread/unthread left pants leg, Don/Doff left shoe, Fasten/unfasten right shoe, Fasten/unfasten left shoe Lower body dressing/undressing: 5: Supervision: Safety issues/verbal cues  FIM - Toileting Toileting steps completed by patient: Adjust clothing prior to toileting, Performs perineal hygiene, Adjust clothing after toileting Toileting Assistive Devices: Grab bar or rail for support Toileting: 5: Set-up assist to: Obtain supplies  FIM - Radio producer Devices: Elevated toilet seat, Environmental consultant, Grab bars Toilet Transfers: 5-To toilet/BSC: Supervision (verbal cues/safety issues), 5-From toilet/BSC: Supervision (verbal cues/safety issues)  FIM - Control and instrumentation engineer Devices: Copy: 6: Supine > Sit: No assist, 6: Sit > Supine: No assist, 6: Chair or W/C > Bed: No assist, 6: Bed > Chair or W/C: No assist  FIM - Locomotion:  Wheelchair Locomotion: Wheelchair: 0:  Activity did not occur FIM - Locomotion: Ambulation Locomotion: Ambulation Assistive Devices: Walker - Rolling Ambulation/Gait Assistance: 6: Modified independent (Device/Increase time) Locomotion: Ambulation: 5: Travels 150 ft or more with supervision/safety issues  Comprehension Comprehension Mode: Auditory Comprehension: 7-Follows complex conversation/direction: With no assist  Expression Expression Mode: Verbal Expression: 7-Expresses complex ideas: With no assist  Social Interaction Social Interaction: 7-Interacts appropriately with others - No medications needed.  Problem Solving Problem Solving: 7-Solves complex problems: Recognizes & self-corrects  Memory Memory: 7-Complete Independence: No helper  Medical Problem List and Plan: 1. 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 DVT Prophylaxis/Anticoagulation: xarelto after dc for 30 days 3. Pain Management:   tramadol and Robaxin as needed. Aggressive ice 4. Acute blood loss anemia. hgb 9 recently. outpt follow up. No signs of blood loss 5. Neuropsych: This patient is capable of making decisions on her own behalf. 6. Skin/Wound Care: Routine skin checks 7. Fluids/Electrolytes/Nutrition: replace potassium 8. Hypertension. Hygroton25 mg daily, Cozaar 100 mg daily. 9. Constipation. Adjust bowel program. No nausea vomiting. 10. Recurrent UTIs. Continue prophylactic Macrodantin as prior to admission. Follow-up urine study if needed.   -voiding regularly, afebrile 11. MRSA PCR screen positive. Maintained on contact precautions LOS (Days) 4 A FACE TO FACE EVALUATION WAS PERFORMED  SWARTZ,ZACHARY T 03/18/2015 7:42 AM

## 2015-03-18 NOTE — Progress Notes (Signed)
Social Work  Discharge Note  The overall goal for the admission was met for:   Discharge location: Yes - home with intermittent assistance from private duty and husband  Length of Stay: Yes - 5 days (with 3/19 discharge)  Discharge activity level: Yes - modified independent  Home/community participation: Yes  Services provided included: MD, RD, PT, OT, RN, TR, Pharmacy and [place]: Private Insurance: [facility]  Follow-up services arranged: Home Health: PT via [place], DME: rolling walker, bedside commode via [place] and Patient/Family has no preference for HH/DME agencies  Comments (or additional information):  Patient/Family verbalized understanding of follow-up arrangements: Yes  Individual responsible for coordination of the follow-up plan: patient  Confirmed correct DME delivered: Jessicia Napolitano 03/18/2015    Cailey Trigueros

## 2015-03-18 NOTE — Progress Notes (Addendum)
Occupational Therapy Session Note  Patient Details  Name: Kaylen Motl MRN: [id number] Date of Birth: [birth year]  Today's Date: 03/18/2015 OT Individual Time: 0700-0800 OT Individual Time Calculation (min): 60 min    Short Term Goals: Week 1:  OT Short Term Goal 1 (Week 1): Focus on LTGs due to short ELOS  Skilled Therapeutic Interventions/Progress Updates:    Pt completed all BADLs (shower, dress, grooming, toilet transfers, and toileting) at mod I level.  Pt amb with RW to gather supplies and clothing prior to entering bathroom. Pt completed dressing tasks with sit<>stand from EOB.  Pt practiced toilet transfers without elevated seat and decided she would need a BSC at home.  SW notified. Pt transitioned to ADL apartment and completed simple meal prep task at mod I level.  Focus on discharge planning, activity tolerance, BADLs, functional amb with RW, and safety awareness.   Therapy Documentation Precautions:  Precautions Precautions: None Restrictions Weight Bearing Restrictions: Yes RLE Weight Bearing: Weight bearing as tolerated LLE Weight Bearing: Weight bearing as tolerated Other Position/Activity Restrictions: WBAT Pain: Pain Assessment Pain Assessment: No/denies pain Pain Score: 0-No pain (pre therapy medication "I havent moved yet") Pain Intervention(s):Premedicated  See FIM for current functional status  Therapy/Group: Individual Therapy  Leroy Libman 03/18/2015, 8:04 AM

## 2015-03-18 NOTE — Progress Notes (Signed)
Occupational Therapy Discharge Summary  Patient Details  Name: Tara Stanley MRN: [id number] Date of Birth: 01-30-1956  Patient has met 9 of 9 long term goals due to improved activity tolerance, improved balance, postural control, ability to compensate for deficits and functional use of  RIGHT lower and LEFT lower extremity.  Pt made excellent progress with BADLs and IADLs during this admission and is at mod I for all tasks.  Family has not been present for therapy.  Patient to discharge at overall Modified Independent level.  Patient's care partner is independent and not necessary to provide the necessary physical and cognitive assistance at discharge secondary to modified independent goals.    Recommendation:  No skilled occupational therapy recommended at this time.  Equipment: BSC  Reasons for discharge: treatment goals met and discharge from hospital  Patient/family agrees with progress made and goals achieved: Yes  OT Discharge Vision/Perception  Vision- History Baseline Vision/History: Wears glasses Wears Glasses: Distance only Patient Visual Report: No change from baseline Vision- Assessment Vision Assessment?: Yes Eye Alignment: Within Functional Limits Ocular Range of Motion: Within Functional Limits Alignment/Gaze Preference: Within Defined Limits Tracking/Visual Pursuits: Able to track stimulus in all quads without difficulty Saccades: Within functional limits Convergence: Within functional limits Visual Fields: No apparent deficits  Cognition Overall Cognitive Status: Within Functional Limits for tasks assessed Arousal/Alertness: Awake/alert Orientation Level: Oriented X4 Attention: Alternating Alternating Attention: Appears intact Memory: Appears intact Awareness: Appears intact Problem Solving: Appears intact Safety/Judgment: Appears intact Sensation Sensation Light Touch: Appears Intact Hot/Cold: Appears Intact Proprioception: Appears  Intact Coordination Gross Motor Movements are Fluid and Coordinated: Yes Fine Motor Movements are Fluid and Coordinated: Yes Motor  Motor Motor: Within Functional Limits    Trunk/Postural Assessment  Cervical Assessment Cervical Assessment: Within Functional Limits Thoracic Assessment Thoracic Assessment: Within Functional Limits Lumbar Assessment Lumbar Assessment: Within Functional Limits Postural Control Postural Control: Within Functional Limits  Balance Dynamic Sitting Balance Dynamic Sitting - Balance Support: Feet supported Dynamic Sitting - Level of Assistance: 7: Independent Extremity/Trunk Assessment RUE Assessment RUE Assessment: Within Functional Limits LUE Assessment LUE Assessment: Within Functional Limits  See FIM for current functional status  Leroy Libman 03/18/2015, 6:53 AM

## 2015-03-18 NOTE — Progress Notes (Signed)
Orthopedic Tech Progress Note Patient Details:  Tara Stanley Jun 15, 1956 [id number] Patient placed in CPMs CPM Left Knee CPM Left Knee: On Left Knee Flexion (Degrees): 90 Left Knee Extension (Degrees): 0 CPM Right Knee CPM Right Knee: On Right Knee Flexion (Degrees): 90 Right Knee Extension (Degrees): 0   Asia R Thompson 03/18/2015, 2:47 PM

## 2015-03-18 NOTE — Discharge Summary (Signed)
[name], VENN NO.:  [id number]  MEDICAL RECORD NO.:  [id number]  LOCATION:  4M05C                        FACILITY:  [facility]  PHYSICIAN:  Meredith Staggers, M.D.DATE OF BIRTH:  [birth year]  DATE OF ADMISSION:  03/14/2015 DATE OF DISCHARGE:  03/19/2015                              DISCHARGE SUMMARY   DISCHARGE DIAGNOSES: 1. Functional deficits secondary to bilateral end-stage osteoarthritis     with bilateral total knee arthroplasty on March 09, 2015. 2. Xarelto for deep venous thrombosis prophylaxis with negative venous     Doppler studies. 3. Pain management. 4. Acute blood loss anemia. 5. Hypertension. 6. Constipation. 7. Recurrent urinary tract infections. 8. MRSA PCR screening positive.  HISTORY OF PRESENT ILLNESS:  This is a 59 year old right-handed female with history of hypertension, who was independent prior to admission living with her husband and very active.  Presented March 08, 2015, with end-stage osteoarthritis bilateral knees and no change with conservative care.  Underwent bilateral total knee arthroplasty on March 09, 2015 per Dr. Wynelle Link.  Hospital course, pain management.  Initially placed on Coumadin for DVT prophylaxis, transitioned to Xarelto p.o. Weightbearing as tolerated.  Acute blood loss anemia 9.0 and monitored. MRSA PCR screening positive, maintained on contact precautions. Physical and occupational therapy ongoing.  The patient was admitted for a comprehensive rehab program.  PAST MEDICAL HISTORY:  See discharge diagnoses.  SOCIAL HISTORY:  Lives with spouse, independent prior to admission and active.  FUNCTIONAL STATUS UPON ADMISSION TO REHAB SERVICES:  Max assist sit to stand, +2 physical assist supine to sit, mod to max assist activities of daily living.  PHYSICAL EXAMINATION:  VITAL SIGNS:  Blood pressure 134/66, pulse 73, temperature 98, respirations 18. GENERAL:  This was an alert female, in no acute distress,  oriented x3. LUNGS:  Clear to auscultation. CARDIAC:  Regular rate and rhythm. ABDOMEN:  Soft, nontender.  Good bowel sounds. EXTREMITIES:  Bilateral knee incisions dressed appropriately, tender. Motor strength upper extremities 5/5.  REHABILITATION HOSPITAL COURSE:  The patient was admitted to Inpatient Rehab Services with therapies initiated on a 3-hour daily basis consisting of physical therapy, occupational therapy, and rehabilitation nursing.  The following issues were addressed during the patient's rehabilitation stay.  Pertaining to Ms. Tara Stanley's functional deficits secondary to bilateral total knee arthroplasties, surgical site healing nicely.  Neurovascular sensation intact.  She would follow up with Dr. Wynelle Link, Orthopedic Services.  She was maintained on Xarelto for DVT prophylaxis.  No bleeding episodes.  Venous Doppler studies negative. Pain management with the use of oxycodone, tramadol, Robaxin as needed, as well as ice with good results.  Acute blood loss anemia, stable, latest hemoglobin of 9, she was asymptomatic.  She did have a documented history of hypertension.  She was on Hygroton as well as Cozaar as prior to admission and documented.  Throughout her stay, she had at times refused any blood pressure medications.  It was discussed at length the need to follow up with her primary MD.  Bouts of constipation, no nausea, vomiting noted.  She remained on contact precautions for MRSA PCR screen positive and afebrile.  Recurrent UTIs.  She had been on Macrodantin prior to admission  prophylactically.  She could resume this at her discretion.  The patient received weekly collaborative interdisciplinary team conferences to discuss estimated length of stay, family teaching, and any barriers to discharge.  She can ambulate around the rehab unit greater than 200 feet with distant supervision. Discussed car transfers.  The patient felt that her 4 door Sedan would be too low to  stand up from, recommended she ride in her SUV with full teaching ongoing.  She practiced techniques, satisfied, and agreed with recommendations.  She was able to gather her belongings for activities of daily living.  Ambulates to her room to gather her supplies.  Full family teaching was completed.  The patient quite satisfied with her rehab stay and discharged to home.  DISCHARGE MEDICATIONS: 1. Colace 100 mg p.o. b.i.d. 2. Robaxin 500 mg p.o. every 6 hours as needed muscle spasms. 3. Ultram 50 mg every 6 hours as needed 4. Xarelto 10 mg p.o. daily x3 more weeks, then begin aspirin 81 mg     daily. 5.   DIET:  Regular.  She was weightbearing as tolerated.  Follow-up with PCP on resuming antihypertensive medications.  FOLLOWUP:  She would follow up Dr. Alger Simons as needed; Dr. Gaynelle Arabian, Orthopedic Service call for appointment; Dr. Tedra Senegal, Medical Management, ongoing therapies were arranged as per Rehab Services.     Lauraine Rinne, P.A.   ______________________________ Meredith Staggers, M.D.    DA/MEDQ  D:  03/18/2015  T:  03/18/2015  Job:  [id number]  cc:   Cresenciano Lick. Renold Genta, M.D. Gaynelle Arabian, M.D.

## 2015-03-18 NOTE — Progress Notes (Signed)
Physical Therapy Discharge Summary  Patient Details  Name: Tara Stanley MRN: [id number] Date of Birth: February 17, 1956  Today's Date: 03/18/2015 PT Individual Time: 4098-1191 and 1100-1200 PT Individual Time Calculation (min): 60 min and 60 min   Patient has met 9 of 9 long term goals due to improved activity tolerance, improved balance, increased strength, increased range of motion, decreased pain, ability to compensate for deficits, functional use of  right lower extremity and left lower extremity and improved coordination.  Patient to discharge at an ambulatory level Modified Independent.   Patient's care partner not necessary secondary to patient discharging at mod I level to provide the necessary assistance at discharge.  Reasons goals not met: N/A, all LTGs met.  Recommendation:  Patient will benefit from ongoing skilled PT services in home health setting to continue to advance safe functional mobility, address ongoing impairments in B knee ROM, strength, balance, activity tolerance, and minimize fall risk.  Equipment: RW  Reasons for discharge: treatment goals met and discharge from hospital  Patient/family agrees with progress made and goals achieved: Yes   Sessions today focused on functional mobility and goal-related activities in preparation for d/c home tomorrow, 03/19/15. Reviewed current HEP and patient provided with HEP with emphasis on B knee ROM, LE strengthening and balance program in standing ([place] A and [place] B). NuStep Level 3 with B UE/LE for patient-assisted B knee ROM. Patient performed all standing [place] A and B exercises 1x10 reps. Patient performed furniture transfer from low, cushioned sofa in ADL apartment with mod I and car transfer to midsize SUV height with set up assistance for management of RW. Patient made mod I in room, educated about precautions, encouraged to ambulate in hallways; RN aware.  Five times Sit to Stand Test (FTSS)  Method: Use a straight back  chair with a solid seat that is 16-18" high. Ask participant to sit on the chair with arms folded across their chest.   Instructions: "Stand up and sit down as quickly as possible 5 times, keeping your arms folded across your chest."   Measurement: Stop timing when the participant stands the 5th time.  TIME: __19.22____ (in seconds) **Patient performed with B UE support  Times > 13.6 seconds is associated with increased disability and morbidity (Guralnik, 2000) Times > 15 seconds is predictive of recurrent falls in healthy individuals aged 38 and older (Buatois, et al., 2008) Normal performance values in community dwelling individuals aged 10 and older (Bohannon, 2006): o 60-69 years: 11.4 seconds o 70-79 years: 12.6 seconds o 80-89 years: 14.8 seconds  MCID: ? 2.3 seconds for Vestibular Disorders (Meretta, 2006)  Gait speed: .91 m/s   PT Discharge Precautions/Restrictions Precautions Precautions: None Restrictions Weight Bearing Restrictions: Yes RLE Weight Bearing: Weight bearing as tolerated LLE Weight Bearing: Weight bearing as tolerated Pain Pain Assessment Pain Assessment: 0-10 Pain Score: 5  Pain Type: Surgical pain Pain Location: Knee Pain Orientation: Right;Left Pain Descriptors / Indicators: Aching;Sore Pain Intervention(s): Ambulation/increased activity;RN made aware;Repositioned Multiple Pain Sites: No Vision/Perception  Vision - Assessment Eye Alignment: Within Functional Limits Ocular Range of Motion: Within Functional Limits Alignment/Gaze Preference: Within Defined Limits Tracking/Visual Pursuits: Able to track stimulus in all quads without difficulty Saccades: Within functional limits Convergence: Within functional limits  Cognition Overall Cognitive Status: Within Functional Limits for tasks assessed Arousal/Alertness: Awake/alert Orientation Level: Oriented X4 Attention: Alternating Alternating Attention: Appears intact Memory: Appears  intact Awareness: Appears intact Problem Solving: Appears intact Safety/Judgment: Appears intact Sensation Sensation Light Touch: Appears Intact Proprioception:  Appears Intact Additional Comments: numbness on medial and ateral aspect of bilateral knees Coordination Gross Motor Movements are Fluid and Coordinated: Yes Fine Motor Movements are Fluid and Coordinated: Yes Motor  Motor Motor: Within Functional Limits Motor - Discharge Observations: [facility] for s/p B TKA  Mobility Bed Mobility Bed Mobility: Supine to Sit;Sit to Supine Supine to Sit: 6: Modified independent (Device/Increase time);HOB flat Sit to Supine: 6: Modified independent (Device/Increase time);HOB flat Transfers Transfers: Yes Sit to Stand: 6: Modified independent (Device/Increase time);With armrests;With upper extremity assist;From chair/3-in-1;From bed Stand to Sit: 6: Modified independent (Device/Increase time);To chair/3-in-1;To bed;With upper extremity assist;With armrests Stand Pivot Transfers: 6: Modified independent (Device/Increase time);With armrests (with RW) Locomotion  Ambulation Ambulation: Yes Ambulation/Gait Assistance: 6: Modified independent (Device/Increase time) Ambulation Distance (Feet): 200 Feet (in controlled environment) several bouts, >500' (in community environment, off unit, outside uneven surfaces, etc.), 25' (in home environment of ADL apartment) Assistive device: Rolling walker Gait Gait: Yes Gait Pattern: Impaired Gait Pattern: Step-through pattern;Decreased hip/knee flexion - right;Decreased hip/knee flexion - left;Antalgic;Trunk flexed;Decreased stride length Stairs / Additional Locomotion Stairs: Yes Stairs Assistance: 6: Modified independent (Device/Increase time) Stairs Assistance Details (indicate cue type and reason): Mod I for negotiation of flight of stairs (up/down); patient would require set up assistance for management of RW if she plans to stay upstairs/downstairs once  negotiating stairs, however, plans to stay on main level and therefore is mod I solely for negotiation of stairs and not for RW management. Stair Management Technique: Two rails;Step to pattern;Forwards;Alternating pattern Number of Stairs: 12 Height of Stairs: 6 Curb: 6: Modified independent (Device/increase time) (with RW) Wheelchair Mobility Wheelchair Mobility: No (patient ambulatory)  Trunk/Postural Assessment  Cervical Assessment Cervical Assessment: Within Functional Limits Thoracic Assessment Thoracic Assessment: Within Functional Limits Lumbar Assessment Lumbar Assessment: Within Functional Limits Postural Control Postural Control: Within Functional Limits Protective Responses: delayed  Balance Balance Balance Assessed: Yes Dynamic Sitting Balance Dynamic Sitting - Balance Support: Feet supported Dynamic Sitting - Level of Assistance: 7: Independent Static Standing Balance Static Standing - Balance Support: During functional activity;Bilateral upper extremity supported;No upper extremity supported Static Standing - Level of Assistance: Patient percentage (comment) Dynamic Standing Balance Dynamic Standing - Balance Support: During functional activity;Bilateral upper extremity supported;No upper extremity supported Dynamic Standing - Level of Assistance: 6: Modified independent (Device/Increase time) Extremity Assessment  RLE Assessment RLE Assessment: Exceptions to [facility] RLE AROM (degrees) Right Knee Extension: -9 (supine) Right Knee Flexion: 96 (sitting EOM) RLE Strength RLE Overall Strength: Deficits RLE Overall Strength Comments: hip flexion 3+/5, knee flexion/extension 3-/5, ankle DF/PF 5/5 LLE Assessment LLE Assessment: Exceptions to [facility] LLE AROM (degrees) Left Knee Extension: -13 (supine) Left Knee Flexion: 99 (sitting EOM) LLE Strength LLE Overall Strength: Deficits LLE Overall Strength Comments: grossly 3-/5 except 5/5 ankle DF/PF  See FIM for current  functional status  Kendle Erker S Anihya Tuma S. Cadin Luka, PT, DPT 03/18/2015, 11:20 AM

## 2015-03-18 NOTE — Discharge Summary (Signed)
Discharge summary job [id number]

## 2015-03-18 NOTE — Progress Notes (Signed)
Patient went off unit with husband at 46 and returned at 67. Patient able to verbalize contact precautions and RN reinforced to sign out when leaving the unit. Patient mod I in room. No complaints of pain at this time. Continue with plan of care.                      Mliss Sax

## 2015-03-19 NOTE — Progress Notes (Signed)
Pt discharged. Pt verbalized understanding of discharge instructions as given by Marlowe Shores, PA. All belongings sent home with pt. Pt discharged and left floor in wheelchair with nursing staff. No signs of distress at this time. Kennieth Francois, RN

## 2015-03-19 NOTE — Progress Notes (Signed)
PHYSICAL MEDICINE & REHABILITATION     PROGRESS NOTE    Subjective/Complaints: Had a good night. Up in room packing to go home!.   Objective: Vital Signs: Blood pressure 113/57, pulse 72, temperature 98.6 F (37 C), temperature source Oral, resp. rate 18, last menstrual period 01/01/2008, SpO2 100 %. No results found. No results for input(s): WBC, HGB, HCT, PLT in the last 72 hours. No results for input(s): NA, K, CL, GLUCOSE, BUN, CREATININE, CALCIUM in the last 72 hours.  Invalid input(s): CO CBG (last 3)  No results for input(s): GLUCAP in the last 72 hours.  Wt Readings from Last 3 Encounters:  02/28/15 67.359 kg (148 lb 8 oz)  01/06/15 67.767 kg (149 lb 6.4 oz)  09/29/14 66.225 kg (146 lb)    Physical Exam:  Constitutional: She is oriented to person, place, and time. She appears well-developed.  HENT:  Head: Normocephalic.  Eyes: EOM are normal.  Neck: Normal range of motion. Neck supple. No thyromegaly present.  Cardiovascular: Normal rate and regular rhythm.  Respiratory: Effort normal and breath sounds normal. No respiratory distress.  GI: Soft. Bowel sounds are normal. She exhibits no distension.  Neurological: She is alert and oriented to person, place, and time.  Skin:  Bilateral knee incisions are clean/dry  Motor: 5/5 in Bilateral delt, bi, tri, grip Right 3-/5 HF, 2+KE, 4/5 , ankle is 4/5 Left 2- HF, 3- KE 3+ to 4/5 M/S: left knee ROM 95-100, Right knee ROM 95-105   Assessment/Plan: 1. Functional deficits secondary to bilateral TKA's which require 3+ hours per day of interdisciplinary therapy in a comprehensive inpatient rehab setting. Physiatrist is providing close team supervision and 24 hour management of active medical problems listed below. Physiatrist and rehab team continue to assess barriers to discharge/monitor patient progress toward functional and medical goals.  Home today. hh follow up. Ortho to see over next week or  so FIM: FIM - Bathing Bathing Steps Patient Completed: Chest, Right Arm, Left Arm, Abdomen, Left upper leg, Right upper leg, Buttocks, Front perineal area, Right lower leg (including foot), Left lower leg (including foot) Bathing: 6: Assistive device (Comment)  FIM - Upper Body Dressing/Undressing Upper body dressing/undressing steps patient completed: Thread/unthread right bra strap, Thread/unthread left bra strap, Hook/unhook bra, Thread/unthread right sleeve of pullover shirt/dresss, Thread/unthread left sleeve of pullover shirt/dress, Put head through opening of pull over shirt/dress, Pull shirt over trunk Upper body dressing/undressing: 7: Complete Independence: No helper FIM - Lower Body Dressing/Undressing Lower body dressing/undressing steps patient completed: Thread/unthread right underwear leg, Thread/unthread left underwear leg, Pull underwear up/down, Thread/unthread right pants leg, Pull pants up/down, Don/Doff right shoe, Thread/unthread left pants leg, Don/Doff left shoe, Fasten/unfasten right shoe, Fasten/unfasten left shoe Lower body dressing/undressing: 6: Assistive device (Comment)  FIM - Toileting Toileting steps completed by patient: Adjust clothing prior to toileting, Performs perineal hygiene, Adjust clothing after toileting Toileting Assistive Devices: Grab bar or rail for support Toileting: 6: Assistive device: No helper  FIM - Radio producer Devices: Elevated toilet seat, Environmental consultant, Grab bars Toilet Transfers: 6-Assistive device: No helper  FIM - Control and instrumentation engineer Devices: Walker, Arm rests Bed/Chair Transfer: 6: Bed > Chair or W/C: No assist, 6: Chair or W/C > Bed: No assist  FIM - Locomotion: Wheelchair Locomotion: Wheelchair: 0: Activity did not occur (pt ambulatory) FIM - Locomotion: Ambulation Locomotion: Ambulation Assistive Devices: Administrator Ambulation/Gait Assistance: 6: Modified  independent (Device/Increase time) Locomotion: Ambulation: 6: Travels 150  ft or more with assistive device/no helper  Comprehension Comprehension Mode: Auditory Comprehension: 7-Follows complex conversation/direction: With no assist  Expression Expression Mode: Verbal Expression: 7-Expresses complex ideas: With no assist  Social Interaction Social Interaction: 7-Interacts appropriately with others - No medications needed.  Problem Solving Problem Solving: 7-Solves complex problems: Recognizes & self-corrects  Memory Memory: 7-Complete Independence: No helper  Medical Problem List and Plan: 1. 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 DVT Prophylaxis/Anticoagulation: xarelto after dc for 30 days 3. Pain Management:   tramadol and Robaxin as needed. Aggressive ice 4. Acute blood loss anemia. hgb 9 recently. outpt follow up. No signs of blood loss 5. Neuropsych: This patient is capable of making decisions on her own behalf. 6. Skin/Wound Care: Routine skin checks 7. Fluids/Electrolytes/Nutrition: replace potassium 8. Hypertension. Hygro[place]25 mg daily, Cozaar 100 mg daily. 9. Constipation. Adjust bowel program. No nausea vomiting. 10. Recurrent UTIs. Continue prophylactic Macrodantin as prior to admission. Follow-up urine study if needed.   -voiding regularly, afebrile 11. MRSA PCR screen positive. Maintained on contact precautions LOS (Days) 5 A FACE TO FACE EVALUATION WAS PERFORMED  Joash Tony T 03/19/2015 9:54 AM

## 2015-03-22 ENCOUNTER — Other Ambulatory Visit: Payer: 59

## 2015-03-25 ENCOUNTER — Ambulatory Visit: Payer: 59 | Admitting: Endocrinology

## 2015-04-01 ENCOUNTER — Encounter: Payer: Self-pay | Admitting: Internal Medicine

## 2015-04-01 ENCOUNTER — Ambulatory Visit (INDEPENDENT_AMBULATORY_CARE_PROVIDER_SITE_OTHER): Payer: 59 | Admitting: Internal Medicine

## 2015-04-01 VITALS — BP 120/76 | HR 86 | Temp 98.0°F | Wt 145.0 lb

## 2015-04-01 DIAGNOSIS — R8290 Unspecified abnormal findings in urine: Secondary | ICD-10-CM | POA: Diagnosis not present

## 2015-04-01 DIAGNOSIS — Z0000 Encounter for general adult medical examination without abnormal findings: Secondary | ICD-10-CM

## 2015-04-01 LAB — POCT URINALYSIS DIPSTICK
BILIRUBIN UA: NEGATIVE
Glucose, UA: NEGATIVE
KETONES UA: NEGATIVE
LEUKOCYTES UA: NEGATIVE
Nitrite, UA: NEGATIVE
PH UA: 7
Protein, UA: NEGATIVE
Spec Grav, UA: 1.025
Urobilinogen, UA: NEGATIVE

## 2015-04-04 ENCOUNTER — Telehealth: Payer: Self-pay | Admitting: *Deleted

## 2015-04-04 ENCOUNTER — Other Ambulatory Visit: Payer: Self-pay

## 2015-04-04 LAB — URINE CULTURE

## 2015-04-04 MED ORDER — CIPROFLOXACIN HCL 500 MG PO TABS: 500.0000 mg | ORAL_TABLET | Freq: Two times a day (BID) | ORAL | Status: DC

## 2015-04-04 NOTE — Telephone Encounter (Signed)
Reviewed results and instructions with patient.

## 2015-04-07 ENCOUNTER — Ambulatory Visit: Payer: Self-pay | Admitting: Endocrinology

## 2015-04-13 ENCOUNTER — Other Ambulatory Visit (INDEPENDENT_AMBULATORY_CARE_PROVIDER_SITE_OTHER): Payer: 59

## 2015-04-13 DIAGNOSIS — E042 Nontoxic multinodular goiter: Secondary | ICD-10-CM | POA: Diagnosis not present

## 2015-04-13 LAB — TSH: TSH: 1.28 u[IU]/mL (ref 0.35–4.50)

## 2015-04-13 LAB — T4, FREE: Free T4: 0.97 ng/dL (ref 0.60–1.60)

## 2015-04-15 ENCOUNTER — Ambulatory Visit (INDEPENDENT_AMBULATORY_CARE_PROVIDER_SITE_OTHER): Payer: 59 | Admitting: Endocrinology

## 2015-04-15 ENCOUNTER — Encounter: Payer: Self-pay | Admitting: Endocrinology

## 2015-04-15 VITALS — BP 130/86 | HR 85 | Temp 98.4°F | Resp 14 | Ht 66.5 in | Wt 144.2 lb

## 2015-04-15 DIAGNOSIS — E042 Nontoxic multinodular goiter: Secondary | ICD-10-CM

## 2015-04-15 NOTE — Progress Notes (Signed)
Patient ID: Tara Stanley, female   DOB: 1956/01/17, 59 y.o.   MRN: [id number]   Reason for Appointment:  Goiter, followup visit    History of Present Illness:   The goiter was first found in the year 2003 incidentally on a routine exam At that time she was in [place] and her endocrinologist evaluated her with serial ultrasounds She had multiple nodules with the largest one being 1.6 cm on the left side She was started on thyroid suppression Synthroid 50 mcg daily She did not feel any different with starting the thyroid supplement and her records do not indicate that she had hypothyroidism  Because of low TSH in 4/15 her Synthroid was stopped  Subsequently her TSH level has gradually come back to normal   The patient feels fairly well and does not usually notice any swelling in her neck. No symptoms of local discomfort, difficulty swallowing, no choking sensation. She is now here for her periodic follow-up   Lab Results  Component Value Date   FREET4 0.97 04/13/2015   FREET4 0.81 09/21/2014   FREET4 0.69 05/27/2014   TSH 1.28 04/13/2015   TSH 0.35 09/21/2014   TSH 0.24* 05/27/2014       Medication List       This list is accurate as of: 04/15/15 11:59 PM.  Always use your most recent med list.               acetaminophen 325 MG tablet  Commonly known as:  TYLENOL  Take 2 tablets (650 mg total) by mouth every 6 (six) hours as needed for mild pain (or Fever >/= 101).     aspirin 81 MG tablet  Take 81 mg by mouth daily.     estradiol 2 MG vaginal ring  Commonly known as:  ESTRING  Place vaginally.     losartan 100 MG tablet  Commonly known as:  COZAAR     methocarbamol 500 MG tablet  Commonly known as:  ROBAXIN  Take 1 tablet (500 mg total) by mouth every 6 (six) hours as needed for muscle spasms.     oxyCODONE 5 MG immediate release tablet  Commonly known as:  Oxy IR/ROXICODONE  Take 1 tablet (5 mg total) by mouth every 6 (six) hours as needed for severe  pain.     traMADol 50 MG tablet  Commonly known as:  ULTRAM  Take 1-2 tablets (50-100 mg total) by mouth every 6 (six) hours as needed (mild pain).        Allergies:  Allergies  Allergen Reactions  . Hydrocodone Other (See Comments)    Makes me goofy    Past Medical History  Diagnosis Date  . Hypertension   . Thyroid disease      nodule, hypothyroidism pt was on synthyroid but is not having to take currently pt states her MD states she does not have issues with   . Spinal stenosis     mild hx of herniated disc  . Arthritis     osteoarthritis  . Cancer     melanoma- right upper arm  . Osteomyelitis     left leg  . Renal cyst     benign, found on CT scan, is being followed  . History of frequent urinary tract infections     on prophalaytic medication     Past Surgical History  Procedure Laterality Date  . Appendectomy  03/2011    laparoscopic  . Repair of rt med meniscal tear    .  Skin cancer excision      right upper arm  . Left tibia      for osteomelitis of left tibia at age 59  . Colonscopy     . Total knee arthroplasty Bilateral 03/09/2015    Procedure: BILATERAL TOTAL KNEE ARTHROPLASTY;  Surgeon: Gaynelle Arabian, MD;  Location: [facility];  Service: Orthopedics;  Laterality: Bilateral;    Family History  Problem Relation Age of Onset  . Colon cancer Neg Hx   . Esophageal cancer Neg Hx   . Stomach cancer Neg Hx   . Rectal cancer Neg Hx   . Thyroid disease Neg Hx   . Diabetes Mother     borderline type II  . Hypertension Mother   . Arthritis Mother   . Hypertension Father   . Stroke Father   . Hypertension Other     all grandparents  . Stroke Maternal Grandmother   . Osteoporosis Maternal Grandmother   . Stroke Paternal Grandfather   . Aneurysm Maternal Aunt 63    Social History:  reports that she quit smoking about 21 years ago. Her smoking use included Cigarettes. She has a 40 pack-year smoking history. She has never used smokeless tobacco. She  reports that she drinks about 1.2 oz of alcohol per week. She reports that she uses illicit drugs.  REVIEW Of SYSTEMS:  She is on antihypertensives and is followed by PCP     Examination:   BP 130/86 mmHg  Pulse 85  Temp(Src) 98.4 F (36.9 C)  Resp 14  Ht 5' 6.5" (1.689 m)  Wt 144 lb 3.2 oz (65.409 kg)  BMI 22.93 kg/m2  SpO2 97%  LMP 01/01/2008  She looks well  NECK: Thyroid is palpable on the left side, has a firm area about 1.5-2 cm especially on swallowing Right side is not clearly palpable and is relatively soft  . No lymphadenopathy in the neck  NEUROLOGIC EXAM:  biceps reflexes show normal relaxation    Assessment   History of multinodular goiter, long-standing She had been on low-dose Synthroid for suppression previously Her Synthroid had been stopped and subsequently her TSH is recovered back to normal from the slightly low levels   Her goiter is relatively small and most likely the left-sided 1.6 cm thyroid nodule that was initially diagnosis is stable in size as judged by her exam   Plan:  Check thyroid levels in follow-up in 1 year  Armand Preast 04/17/2015, 2:25 PM

## 2015-04-30 NOTE — Progress Notes (Signed)
   Subjective:    Patient ID: Tara Stanley, female    DOB: March 11, 1956, 59 y.o.   MRN: [id number]  HPI  Patient is doing amazingly well after bilateral knee replacement surgery done 03/09/2015. She went to [facility] rehabilitation for short. Of time and is now home. She is walking with a cane and looks great. No significant complications postop.  She had a catheter postoperatively. History recurrent urinary infections. Urinalysis today is abnormal. Culture taken. She is on prophylactic antibiotic at the present time.  Review of Systems     Objective:   Physical Exam  Skin warm and dry. Nodes none. Neck is supple without JVD thyromegaly or carotid bruits. Chest clear to auscultation. Cardiac exam regular rate and rhythm. Extremities without edema      Assessment & Plan:  Essential hypertension  Status post bilateral knee arthroplasties 03/09/2015  Plan: Return in 6 months or as needed.  Addendum: She has 35,000 colonies per milliliter Escherichia coli. She will be treated with Cipro and then restart her prophylactic medication. Although this is less than 100,000 colonies per liter I'm treating her to prevent another urinary tract infection since she had a urinary catheter.

## 2015-04-30 NOTE — Patient Instructions (Signed)
Urine culture checked. Urine check today because of history of catheter status post surgery. Continue same medications and return in 6 months.

## 2015-06-01 HISTORY — PX: CATARACT EXTRACTION, BILATERAL: SHX1313

## 2015-09-07 ENCOUNTER — Other Ambulatory Visit: Payer: Self-pay | Admitting: Obstetrics and Gynecology

## 2015-09-07 NOTE — Telephone Encounter (Signed)
Medication refill request: Estring  Last AEX:  09/29/14  Next AEX: 10/12/15  Last MMG (if hormonal medication request): 01/11/14 [id number]:neg Refill authorized: please advise.

## 2015-09-07 NOTE — Telephone Encounter (Signed)
Please contact patient regarding her need for mammogram for 2016.   If this was done, please forward the results to me for review before I refill her Estring.   Thank you so much.

## 2015-09-08 MED ORDER — ESTRADIOL 2 MG VA RING: 2.0000 mg | VAGINAL_RING | VAGINAL | Status: DC

## 2015-09-08 NOTE — Telephone Encounter (Signed)
I will refill this until we can discuss this personally in October at her annual exam.  Yearly mammograms are recommended for prescriptions that are hormonal.  Future prescriptions will be yearly.

## 2015-09-08 NOTE — Telephone Encounter (Signed)
Patient states she has been doing her MMGs every 2 years. She states she does not need a refill now and will discuss this with Dr. Quincy Simmonds when she comes for her appt in October.   Rx refused Dr. Antony Blackbird Encounter closed.

## 2015-09-08 NOTE — Addendum Note (Signed)
Addended by: Yisroel Ramming, Maritssa Haughton E on: 09/08/2015 11:56 AM   Modules accepted: Orders

## 2015-10-12 ENCOUNTER — Encounter: Payer: Self-pay | Admitting: Obstetrics and Gynecology

## 2015-10-12 ENCOUNTER — Ambulatory Visit (INDEPENDENT_AMBULATORY_CARE_PROVIDER_SITE_OTHER): Payer: 59 | Admitting: Obstetrics and Gynecology

## 2015-10-12 ENCOUNTER — Other Ambulatory Visit: Payer: Self-pay

## 2015-10-12 VITALS — BP 126/82 | HR 88 | Resp 14 | Ht 66.5 in | Wt 142.4 lb

## 2015-10-12 DIAGNOSIS — N951 Menopausal and female climacteric states: Secondary | ICD-10-CM

## 2015-10-12 DIAGNOSIS — Z1231 Encounter for screening mammogram for malignant neoplasm of breast: Secondary | ICD-10-CM

## 2015-10-12 DIAGNOSIS — Z01419 Encounter for gynecological examination (general) (routine) without abnormal findings: Secondary | ICD-10-CM | POA: Diagnosis not present

## 2015-10-12 NOTE — Patient Instructions (Addendum)
Menopause and Herbal Products WHAT IS MENOPAUSE? Menopause is the normal time of life when menstrual periods decrease in frequency and eventually stop completely. This process can take several years for some women. Menopause is complete when you have had an absence of menstruation for a full year since your last menstrual period. It usually occurs between the ages of 48 and 55. It is not common for menopause to begin before the age of 40. During menopause, your body stops producing the female hormones estrogen and progesterone. Common symptoms associated with this loss of hormones (vasomotor symptoms) are:  Hot flashes.  Hot flushes.  Night sweats. Other common symptoms and complications of menopause include:  Decrease in sex drive.  Vaginal dryness and thinning of the walls of the vagina. This can make sex painful.  Dryness of the skin and development of wrinkles.  Headaches.  Tiredness.  Irritability.  Memory problems.  Weight gain.  Bladder infections.  Hair growth on the face and chest.  Inability to reproduce offspring (infertility).  Loss of density in the bones (osteoporosis) increasing your risk for breaks (fractures).  Depression.  Hardening and narrowing of the arteries (atherosclerosis). This increases your risk of heart attack and stroke. WHAT TREATMENT OPTIONS ARE AVAILABLE? There are many treatment choices for menopause symptoms. The most common treatment is hormone replacement therapy. Many alternative therapies for menopause are emerging, including the use of herbal products. These supplements can be found in the form of herbs, teas, oils, tinctures, and pills. Common herbal supplements for menopause are made from plants that contain phytoestrogens. Phytoestrogens are compounds that occur naturally in plants and plant products. They act like estrogen in the body. Foods and herbs that contain phytoestrogens include:  Soy.  Flax seeds.  Red  clover.  Ginseng. WHAT MENOPAUSE SYMPTOMS MAY BE HELPED IF I USE HERBAL PRODUCTS?  Vasomotor symptoms. These may be helped by:  Soy. Some studies show that soy may have a moderate benefit for hot flashes.  Black cohosh. There is limited evidence indicating this may be beneficial for hot flashes.  Symptoms that are related to heart and blood vessel disease. These may be helped by soy. Studies have shown that soy can help to lower cholesterol.  Depression. This may be helped by:  St. John's wort. There is limited evidence that shows this may help mild to moderate depression.  Black cohosh. There is evidence that this may help depression and mood swings.  Osteoporosis. Soy may help to decrease bone loss that is associated with menopause and may prevent osteoporosis. Limited evidence indicates that red clover may offer some bone loss protection as well. Other herbal products that are commonly used during menopause lack enough evidence to support their use as a replacement for conventional menopause therapies. These products include evening primrose, ginseng, and red clover. WHAT ARE THE CASES WHEN HERBAL PRODUCTS SHOULD NOT BE USED DURING MENOPAUSE? Do not use herbal products during menopause without your health care provider's approval if:  You are taking medicine.  You have a preexisting liver condition. ARE THERE ANY RISKS IN MY TAKING HERBAL PRODUCTS DURING MENOPAUSE? If you choose to use herbal products to help with symptoms of menopause, keep in mind that:  Different supplements have different and unmeasured amounts of herbal ingredients.  Herbal products are not regulated the same way that medicines are.  Concentrations of herbs may vary depending on the way they are prepared. For example, the concentration may be different in a pill, tea, oil, and tincture.    Little is known about the risks of using herbal products, particularly the risks of long-term use.  Some herbal  supplements can be harmful when combined with certain medicines. Most commonly reported side effects of herbal products are mild. However, if used improperly, many herbal supplements can cause serious problems. Talk to your health care provider before starting any herbal product. If problems develop, stop taking the supplement and let your health care provider know.   This information is not intended to replace advice given to you by your health care provider. Make sure you discuss any questions you have with your health care provider.   Document Released: 06/04/2008 Document Revised: 01/07/2015 Document Reviewed: 06/01/2014 Elsevier Interactive Patient Education 2016 [employer].  Paroxetine capsules What is this medicine? PAROXETINE (pa ROX e teen) is used to treat hot flashes due to menopause. This medicine may be used for other purposes; ask your health care provider or pharmacist if you have questions. What should I tell my health care provider before I take this medicine? They need to know if you have any of these conditions: -bleeding disorders -glaucoma -heart disease -kidney disease -liver disease -low levels of sodium in the blood -mania or bipolar disorder -seizures -suicidal thoughts, plans, or attempt; a previous suicide attempt by you or a family member -take MAOIs like Carbex, Eldepryl, Marplan, Nardil, and Parnate -take medicines that treat or prevent blood clots -an unusual or allergic reaction to paroxetine, other medicines, foods, dyes, or preservatives -pregnant or trying to get pregnant -breast-feeding How should I use this medicine? Take this medicine by mouth once daily at bedtime. Follow the directions on the prescription label. This medicine can be taken with or without food. Take your medicine at regular intervals. Do not take your medicine more often than directed. A special MedGuide will be given to you by the pharmacist with each prescription and refill.  Be sure to read this information carefully each time. Overdosage: If you think you have taken too much of this medicine contact a poison control center or emergency room at once. NOTE: This medicine is only for you. Do not share this medicine with others. What if I miss a dose? If you miss a dose, take it as soon as you can. If it is almost time for your next dose, take only that dose. Do not take double or extra doses. What may interact with this medicine? Do not take this medicine with any of the following medications: -linezolid -MAOIs like Carbex, Eldepryl, Marplan, Nardil, and Parnate -methylene blue (injected into a vein) -pimozide -thioridazine This medicine may also interact with the following medications: -alcohol -aspirin and aspirin-like medicines -atomoxetine -certain medicines for depression, anxiety, or psychotic disturbances -certain medicines for irregular heart beat like propafenone, flecainide, encainide, and quinidine -certain medicines for migraine headache like almotriptan, eletriptan, frovatriptan, naratriptan, rizatriptan, sumatriptan, zolmitriptan -cimetidine -digoxin -diuretics -fentanyl -fosamprenavir -furazolidone -isoniazid -lithium -medicines that treat or prevent blood clots like warfarin, enoxaparin, and dalteparin -medicines for sleep -NSAIDs, medicines for pain and inflammation, like ibuprofen or naproxen -phenobarbital -phenytoin -procarbazine -rasagiline -ritonavir -supplements like St. John's wort, kava kava, valerian -tamoxifen -tramadol -tryptophan This list may not describe all possible interactions. Give your health care provider a list of all the medicines, herbs, non-prescription drugs, or dietary supplements you use. Also tell them if you smoke, drink alcohol, or use illegal drugs. Some items may interact with your medicine. What should I watch for while using this medicine? Tell your doctor or healthcare professional if  your  symptoms do not start to get better or if they get worse. Visit your doctor or health care professional for regular checks on your progress. Patients and their families should watch out for new or worsening thoughts of suicide or depression. Also watch out for sudden changes in feelings such as feeling anxious, agitated, panicky, irritable, hostile, aggressive, impulsive, severely restless, overly excited and hyperactive, or not being able to sleep. If this happens, especially at the beginning of treatment or after a change in dose, call your health care professional. Dennis Bast may get drowsy or dizzy. Do not drive, use machinery, or do anything that needs mental alertness until you know how this medicine affects you. Do not stand or sit up quickly, especially if you are an older patient. This reduces the risk of dizzy or fainting spells. Alcohol may interfere with the effect of this medicine. Avoid alcoholic drinks. Your mouth may get dry. Chewing sugarless gum, sucking hard candy and drinking plenty of water will help. Contact your doctor if the problem does not go away or is severe. Women should inform their doctor if they wish to become pregnant or think they might be pregnant. There is a potential for serious side effects to an unborn child. Talk to your health care professional or pharmacist for more information. Do not become pregnant while taking this medicine. What side effects may I notice from receiving this medicine? Side effects that you should report to your doctor or health care professional as soon as possible: -allergic reactions like skin rash, itching or hives, swelling of the face, lips, or tongue -changes in emotions or moods -confusion -depression -feeling faint or lightheaded, falls -seizures -suicidal thoughts or actions -unusual bleeding or bruising -unusually weak or tired -weakness Side effects that usually do not require medical attention (Report these to your doctor or health  care professional if they continue or are bothersome.): -change in sex drive or performance -fatigue -drowsiness -headache -insomnia -nausea/vomiting -upset stomach This list may not describe all possible side effects. Call your doctor for medical advice about side effects. You may report side effects to FDA at 1-800-FDA-1088. Where should I keep my medicine? Keep out of the reach of children. Store at room temperature between 20 and 25 degrees C (68 and 77 degrees F). Throw away any unused medicine after the expiration date. NOTE: This sheet is a summary. It may not cover all possible information. If you have questions about this medicine, talk to your doctor, pharmacist, or health care provider.    2016, Elsevier/Gold Standard. (2012-08-18 12:26:17)  EXERCISE AND DIET:  We recommended that you start or continue a regular exercise program for good health. Regular exercise means any activity that makes your heart beat faster and makes you sweat.  We recommend exercising at least 30 minutes per day at least 3 days a week, preferably 4 or 5.  We also recommend a diet low in fat and sugar.  Inactivity, poor dietary choices and obesity can cause diabetes, heart attack, stroke, and kidney damage, among others.    ALCOHOL AND SMOKING:  Women should limit their alcohol intake to no more than 7 drinks/beers/glasses of wine (combined, not each!) per week. Moderation of alcohol intake to this level decreases your risk of breast cancer and liver damage. And of course, no recreational drugs are part of a healthy lifestyle.  And absolutely no smoking or even second hand smoke. Most people know smoking can cause heart and lung diseases, but did you  know it also contributes to weakening of your bones? Aging of your skin?  Yellowing of your teeth and nails?  CALCIUM AND VITAMIN D:  Adequate intake of calcium and Vitamin D are recommended.  The recommendations for exact amounts of these supplements seem to  change often, but generally speaking 600 mg of calcium (either carbonate or citrate) and 800 units of Vitamin D per day seems prudent. Certain women may benefit from higher intake of Vitamin D.  If you are among these women, your doctor will have told you during your visit.    PAP SMEARS:  Pap smears, to check for cervical cancer or precancers,  have traditionally been done yearly, although recent scientific advances have shown that most women can have pap smears less often.  However, every woman still should have a physical exam from her gynecologist every year. It will include a breast check, inspection of the vulva and vagina to check for abnormal growths or skin changes, a visual exam of the cervix, and then an exam to evaluate the size and shape of the uterus and ovaries.  And after 61 years of age, a rectal exam is indicated to check for rectal cancers. We will also provide age appropriate advice regarding health maintenance, like when you should have certain vaccines, screening for sexually transmitted diseases, bone density testing, colonoscopy, mammograms, etc.   MAMMOGRAMS:  All women over 65 years old should have a yearly mammogram. Many facilities now offer a "3D" mammogram, which may cost around $50 extra out of pocket. If possible,  we recommend you accept the option to have the 3D mammogram performed.  It both reduces the number of women who will be called back for extra views which then turn out to be normal, and it is better than the routine mammogram at detecting truly abnormal areas.    COLONOSCOPY:  Colonoscopy to screen for colon cancer is recommended for all women at age 22.  We know, you hate the idea of the prep.  We agree, BUT, having colon cancer and not knowing it is worse!!  Colon cancer so often starts as a polyp that can be seen and removed at colonscopy, which can quite literally save your life!  And if your first colonoscopy is normal and you have no family history of colon  cancer, most women don't have to have it again for 10 years.  Once every ten years, you can do something that may end up saving your life, right?  We will be happy to help you get it scheduled when you are ready.  Be sure to check your insurance coverage so you understand how much it will cost.  It may be covered as a preventative service at no cost, but you should check your particular policy.

## 2015-10-12 NOTE — Progress Notes (Signed)
Patient ID: Tara Stanley, female   DOB: Apr 25, 1956, 59 y.o.   MRN: [id number] 59 y.o. G2P2 Married Caucasian female here for annual exam.    Concerned about menopausal symptoms.  Would like to consider treatment options. Uses Estring to reduce UTIs and vaginal dryness. Works well. No UTIs since doing this.  Changes in skin and hair.  Taking some biotin.  Asking how much to take.  Hot flashes.   Had bilateral knee replacements at the same time.  Not playing tennis yet.  Also had bilateral cataract surgery.   PCP:  Emeline General, MD   Patient's last menstrual period was 01/01/2008.          Sexually active: Yes.   female The current method of family planning is post menopausal status.    Exercising: Yes.    3 days walking/personal trainer and 3 days cardio. Smoker:  no  Health Maintenance: Pap:  02-14-12 Neg:Neg HR HPV History of abnormal Pap:  no MMG:  01-11-14 Density Cat.C/Neg/BiRads1:[facility]. Has appointment 10-18-15. Colonoscopy:  03-19-12 normal with Dr. Delfin Edis.  Next due 02/2022. BMD:   11-24-13   Result:? Normal:[facility].Marland Kitchen Hx osteopenia.  TDaP:  2010 Screening Labs:  Hb today: Done at [facility], Urine today: Done at [facility]   reports that she quit smoking about 21 years ago. Her smoking use included Cigarettes. She has a 40 pack-year smoking history. She has never used smokeless tobacco. She reports that she drinks about 12.0 oz of alcohol per week. She reports that she uses illicit drugs.  Past Medical History  Diagnosis Date  . Hypertension   . Thyroid disease      nodule, hypothyroidism pt was on synthyroid but is not having to take currently pt states her MD states she does not have issues with   . Spinal stenosis     mild hx of herniated disc  . Arthritis     osteoarthritis  . Cancer ([place])     melanoma- right upper arm  . Osteomyelitis ([facility])     left leg  . Renal cyst     benign, found on CT scan, is being followed  . History of frequent  urinary tract infections     on prophalaytic medication   . Osteopenia     Past Surgical History  Procedure Laterality Date  . Appendectomy  03/2011    laparoscopic  . Repair of rt med meniscal tear    . Skin cancer excision      right upper arm  . Left tibia      for osteomelitis of left tibia at age 35  . Colonscopy     . Total knee arthroplasty Bilateral 03/09/2015    Procedure: BILATERAL TOTAL KNEE ARTHROPLASTY;  Surgeon: Gaynelle Arabian, MD;  Location: [facility];  Service: Orthopedics;  Laterality: Bilateral;  . Cataract extraction, bilateral  06/2015    Current Outpatient Prescriptions  Medication Sig Dispense Refill  . acetaminophen (TYLENOL) 325 MG tablet Take 2 tablets (650 mg total) by mouth every 6 (six) hours as needed for mild pain (or Fever >/= 101). 40 tablet 0  . Biotin 1 MG CAPS Take 1 tablet by mouth daily.    Marland Kitchen BLACK COHOSH PO Take 1 tablet by mouth daily.    . chlorthalidone (HYGROTON) 25 MG tablet Take 25 mg by mouth daily. Takes 1/2 tablet daily    . Cranberry 500 MG CAPS Take 1 tablet by mouth daily.    Marland Kitchen  estradiol (ESTRING) 2 MG vaginal ring Place 2 mg vaginally every 3 (three) months. 1 each 0  . losartan (COZAAR) 100 MG tablet     . Multiple Vitamins-Minerals (CENTRUM ULTRA WOMENS PO) Take 1 tablet by mouth daily.    . traMADol (ULTRAM) 50 MG tablet Take 1-2 tablets (50-100 mg total) by mouth every 6 (six) hours as needed (mild pain). 90 tablet 0   No current facility-administered medications for this visit.    Family History  Problem Relation Age of Onset  . Colon cancer Neg Hx   . Esophageal cancer Neg Hx   . Stomach cancer Neg Hx   . Rectal cancer Neg Hx   . Thyroid disease Neg Hx   . Diabetes Mother     borderline type II  . Hypertension Mother   . Arthritis Mother   . Hypertension Father   . Stroke Father   . COPD Father   . Hypertension Other     all grandparents  . Stroke Maternal Grandmother   . Osteoporosis Maternal Grandmother   .  Stroke Paternal Grandfather   . Aneurysm Maternal Aunt 63  . Hypertension Sister   . Osteoarthritis Sister   . Hypertension Brother   . Osteoarthritis Brother     ROS:  Pertinent items are noted in HPI.  Otherwise, a comprehensive ROS was negative.  Exam:   BP 126/82 mmHg  Pulse 88  Resp 14  Ht 5' 6.5" (1.689 m)  Wt 142 lb 6.4 oz (64.592 kg)  BMI 22.64 kg/m2  LMP 01/01/2008    General appearance: alert, cooperative and appears stated age Head: Normocephalic, without obvious abnormality, atraumatic Neck: no adenopathy, supple, symmetrical, trachea midline and thyroid nodules noted.  (known). Lungs: clear to auscultation bilaterally Breasts: normal appearance, no masses or tenderness, Inspection negative, No nipple retraction or dimpling, No nipple discharge or bleeding, No axillary or supraclavicular adenopathy Heart: regular rate and rhythm Abdomen: soft, non-tender; bowel sounds normal; no masses,  no organomegaly Extremities: extremities normal, atraumatic, no cyanosis or edema Skin: Skin color, texture, turgor normal. No rashes or lesions Lymph nodes: Cervical, supraclavicular, and axillary nodes normal. No abnormal inguinal nodes palpated Neurologic: Grossly normal  Pelvic: External genitalia:  no lesions              Urethra:  normal appearing urethra with no masses, tenderness or lesions              Bartholins and Skenes: normal                 Vagina: normal appearing vagina with normal color and discharge, no lesions                 Cervix: no lesions              Pap taken: Yes.   Bimanual Exam:  Uterus:  normal size, contour, position, consistency, mobility, non-tender              Adnexa: normal adnexa and no mass, fullness, tenderness              Rectovaginal: No..  Confirms.              Anus:  normal sphincter tone, no lesions Estring removed for exam and replaced after pelvic exam.  Chaperone was present for exam.  Assessment:   Well woman visit with  normal exam. Hx recurrent UTIs.  Uses Estring.  Hx osteopenia.  Thyroid nodules.  Followed by endocrinology.  Plan: Yearly mammogram recommended after age 17.  Recommended self breast exam.  Pap and HR HPV as above. Discussed Calcium, Vitamin D, regular exercise program including cardiovascular and weight bearing exercise. Labs performed.  No..   See orders. Refills given on medications.  No..  See orders.  Will refill Estring after she mammogram back and is normal. Discussed risks of DVT, PE, MI, stroke, breast cancer. Bone density due in the next year.  Follow up annually and prn.   Additional counseling given regarding menopausal symptoms and treatment options.   I discussed risks of starting HRT at her age.  We discussed the WHI and discussed risks of DVT, PE, MI, stroke, and breast cancer.  We discussed SSRIs and SNRIs.  Information on [place].  We discussed herbal options.  She will not do any Rx at this time.   After visit summary provided.   _15______ additional minutes face to face time of which over 50% was spent in counseling regarding menopausal symptoms and treatment options.

## 2015-10-17 LAB — IPS PAP TEST WITH HPV

## 2015-10-18 ENCOUNTER — Ambulatory Visit
Admission: RE | Admit: 2015-10-18 | Discharge: 2015-10-18 | Disposition: A | Discharge status: Home / Self Care | Payer: 59 | Source: Ambulatory Visit

## 2015-10-18 DIAGNOSIS — Z1231 Encounter for screening mammogram for malignant neoplasm of breast: Secondary | ICD-10-CM

## 2015-10-20 ENCOUNTER — Other Ambulatory Visit: Payer: Self-pay | Admitting: Obstetrics and Gynecology

## 2015-10-20 DIAGNOSIS — R928 Other abnormal and inconclusive findings on diagnostic imaging of breast: Secondary | ICD-10-CM

## 2015-10-25 ENCOUNTER — Ambulatory Visit
Admission: RE | Admit: 2015-10-25 | Discharge: 2015-10-25 | Disposition: A | Discharge status: Home / Self Care | Payer: 59 | Source: Ambulatory Visit | Attending: Obstetrics and Gynecology | Admitting: Obstetrics and Gynecology

## 2015-10-25 DIAGNOSIS — R928 Other abnormal and inconclusive findings on diagnostic imaging of breast: Secondary | ICD-10-CM

## 2015-10-26 ENCOUNTER — Other Ambulatory Visit: Payer: 59

## 2015-11-07 ENCOUNTER — Other Ambulatory Visit: Payer: Self-pay | Admitting: Obstetrics and Gynecology

## 2015-11-07 MED ORDER — ESTRADIOL 2 MG VA RING: 2.0000 mg | VAGINAL_RING | VAGINAL | Status: DC

## 2015-11-07 NOTE — Telephone Encounter (Signed)
Patient calling to check on status of refills on estring to [facility]. She said, "I had my mammogram on 10/25/15."

## 2015-11-07 NOTE — Telephone Encounter (Signed)
Medication refill request: Estring  Last AEX:  10/12/15 Dr. Quincy Simmonds Next AEX: 10/19/16 Dr. Quincy Simmonds  Last MMG (if hormonal medication request): 10/25/6 Diagnostic Bilateral BIRADS2:Benign . [place] left 10/25/15 BIRADS2:Benign  Refill authorized: 09/08/15 #[email or web address].

## 2015-11-13 ENCOUNTER — Other Ambulatory Visit: Payer: Self-pay | Admitting: Internal Medicine

## 2015-11-14 ENCOUNTER — Telehealth: Payer: Self-pay | Admitting: Obstetrics and Gynecology

## 2015-11-14 NOTE — Telephone Encounter (Signed)
Patient is calling to check on her request for 1 year supply for Estring from [facility].

## 2015-11-14 NOTE — Telephone Encounter (Signed)
Rx sent 11/07/15 pt notified.

## 2016-02-09 ENCOUNTER — Other Ambulatory Visit: Payer: Self-pay | Admitting: Internal Medicine

## 2016-02-09 NOTE — Telephone Encounter (Signed)
Due for PE without Pap after April 1st please book before refilling until appt.

## 2016-02-09 NOTE — Telephone Encounter (Signed)
Attempted to contact patient at number on file, no answer, left voicemail for return call

## 2016-02-09 NOTE — Telephone Encounter (Signed)
Patient states that she gets her CPE with [facility]. She made an appt for Mon. March 27th for a blood pressure check and she states that she will bring her labs with her. I asked her to fast that morning just in case, as she declined labs stating that she had them drawn in October and that we should have gotten a copy.

## 2016-03-26 ENCOUNTER — Encounter: Payer: Self-pay | Admitting: Internal Medicine

## 2016-03-26 ENCOUNTER — Ambulatory Visit (INDEPENDENT_AMBULATORY_CARE_PROVIDER_SITE_OTHER): Payer: 59 | Admitting: Internal Medicine

## 2016-03-26 VITALS — BP 118/78 | HR 78 | Temp 98.2°F | Resp 18 | Wt 143.0 lb

## 2016-03-26 DIAGNOSIS — Z720 Tobacco use: Secondary | ICD-10-CM

## 2016-03-26 DIAGNOSIS — Z87891 Personal history of nicotine dependence: Secondary | ICD-10-CM

## 2016-03-26 DIAGNOSIS — M8580 Other specified disorders of bone density and structure, unspecified site: Secondary | ICD-10-CM

## 2016-03-26 DIAGNOSIS — R938 Abnormal findings on diagnostic imaging of other specified body structures: Secondary | ICD-10-CM

## 2016-03-26 DIAGNOSIS — I10 Essential (primary) hypertension: Secondary | ICD-10-CM | POA: Diagnosis not present

## 2016-03-26 DIAGNOSIS — R9389 Abnormal findings on diagnostic imaging of other specified body structures: Secondary | ICD-10-CM

## 2016-03-26 NOTE — Patient Instructions (Signed)
Needs follow-up on abnormal chest CT and bone density study at [facility]. Return here in one year or as needed.

## 2016-03-26 NOTE — Progress Notes (Signed)
   Subjective:    Patient ID: Tara Stanley, female    DOB: 06-23-1956, 60 y.o.   MRN: [id number]  HPI Here with right foot stress fracture  Involving  right 4th metatarasal about 6 weeks ago. Was doing power walking when foot began to hurt.   Seeing Dr. Doran Durand. Currently wearing a boot. Has been told fracture is starting to heal. Going next week to [facility] for 10 days. May want to wrap foot in Ace wrap and wear tennis shoe. Recommended icing foot down twice daily for 20 minutes.  With regard to bilateral knee replacement, it's taking some time for her to get her strength back and feel better. Still has some issues with range of motion. She's worked very hard and is very physically fit.  With regard hypertension it's under excellent control on current regimen.  With regard to routine health maintenance, she had physical exam at [place] with Dr. Linward Foster. She had a chest CT after a plain chest x-ray showed of questionable abnormality. She apparently has 3 pulmonary nodules they are following. Told her they would likely recommend follow-up CT in 6-12 months from last one. She did smoke in the remote past. She smoked for about 20 years but quit. She's concerned because father had emphysema. He did not quit smoking until his 39s. Suggested she may want to have pulmonary function testing done but she's asymptomatic and able to exercise without any shortness of breath. However chest CT and chest x-ray suggested she might have some emphysema. She's worried that this might get worse.  With regard to abnormality of kidney, she's followed by Oncology at [facility], Dr. Alford Highland. She has routine ultrasounds.  With regard to UTIs, urine specimen done at [facility] executive health October 2016. She is currently asymptomatic.  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 They're going to spend summers at [employer] [place] but will living  [place] during the winters. They will also be doing some traveling.    Review of Systems     Objective:   Physical Exam  Chest clear. Cardiac exam regular rate and rhythm. Extremity is without edema. Blood pressure is excellent. Skin warm and dry. Right foot in boot.      Assessment & Plan:  Right fourth metatarsal stress fracture-needs bone density study. Last one on file that I can find was 2014 and she had osteopenia at that time.  Osteopenia-needs repeat bone density study  Essential hypertension stable on current regimen  Abnormal chest CT-to be followed at [place] by Dr. Alford Highland at [facility]: Patient has appointment in August to be rechecked [facility]. We discussed my recommendations. Suggest repeat CT in 6-12 months from initial 1. She was to have bone density study done at [facility]. Antihypertensive medication can be refilled for 1 year. She brings in copy of lab work from [facility].

## 2016-04-10 ENCOUNTER — Other Ambulatory Visit (INDEPENDENT_AMBULATORY_CARE_PROVIDER_SITE_OTHER): Payer: 59

## 2016-04-10 ENCOUNTER — Other Ambulatory Visit: Payer: 59

## 2016-04-10 DIAGNOSIS — E042 Nontoxic multinodular goiter: Secondary | ICD-10-CM | POA: Diagnosis not present

## 2016-04-10 LAB — T4, FREE: Free T4: 1.12 ng/dL (ref 0.60–1.60)

## 2016-04-10 LAB — TSH: TSH: 0.53 u[IU]/mL (ref 0.35–4.50)

## 2016-04-12 ENCOUNTER — Ambulatory Visit (INDEPENDENT_AMBULATORY_CARE_PROVIDER_SITE_OTHER): Payer: 59 | Admitting: Endocrinology

## 2016-04-12 ENCOUNTER — Encounter: Payer: Self-pay | Admitting: Endocrinology

## 2016-04-12 VITALS — BP 126/80 | HR 84 | Temp 98.3°F | Resp 14 | Ht 66.5 in | Wt 149.0 lb

## 2016-04-12 DIAGNOSIS — E042 Nontoxic multinodular goiter: Secondary | ICD-10-CM | POA: Diagnosis not present

## 2016-04-12 NOTE — Progress Notes (Signed)
Patient ID: Tara Stanley, female   DOB: [birth year], 60 y.o.   MRN: [id number]   Reason for Appointment:  Goiter, followup visit    History of Present Illness:   The goiter was first found in the year 2003 incidentally on a routine exam At that time she was in [place] and her endocrinologist evaluated her with serial ultrasounds She had multiple nodules with the largest one being 1.6 cm on the left side She was started on thyroid suppression Synthroid 50 mcg daily She did not feel any different with starting the thyroid supplement and her records do not indicate that she had hypothyroidism  Because of low TSH in 4/15 her Synthroid was stopped  Subsequently her TSH level has been normal   The patient feels  well and does not  notice any swelling in her neck. No symptoms of local discomfort, difficulty swallowing, no choking sensation. She is now here for her annual follow-up  Thyroid levels:  Lab Results  Component Value Date   TSH 0.53 04/10/2016   TSH 1.28 04/13/2015   TSH 0.35 09/21/2014   FREET4 1.12 04/10/2016   FREET4 0.97 04/13/2015   FREET4 0.81 09/21/2014        Medication List       This list is accurate as of: 04/12/16 11:01 AM.  Always use your most recent med list.               acetaminophen 325 MG tablet  Commonly known as:  TYLENOL  Take 2 tablets (650 mg total) by mouth every 6 (six) hours as needed for mild pain (or Fever >/= 101).     Biotin 1 MG Caps  Take 1 tablet by mouth daily.     BLACK COHOSH PO  Take 1 tablet by mouth daily.     CENTRUM ULTRA WOMENS PO  Take 1 tablet by mouth daily.     chlorthalidone 25 MG tablet  Commonly known as:  HYGROTON  Take 25 mg by mouth daily. Takes 1/2 tablet daily     Cranberry 500 MG Caps  Take 1 tablet by mouth daily.     estradiol 2 MG vaginal ring  Commonly known as:  ESTRING  Place 2 mg vaginally every 3 (three) months.     losartan 100 MG tablet  Commonly known as:  COZAAR    TAKE 1 TABLET DAILY        Allergies:  Allergies  Allergen Reactions  . Hydrocodone Other (See Comments)    Makes me goofy    Past Medical History  Diagnosis Date  . Hypertension   . Thyroid disease      nodule, hypothyroidism pt was on synthyroid but is not having to take currently pt states her MD states she does not have issues with   . Spinal stenosis     mild hx of herniated disc  . Arthritis     osteoarthritis  . Cancer ([place])     melanoma- right upper arm  . Osteomyelitis (HCC)     left leg  . Renal cyst     benign, found on CT scan, is being followed  . History of frequent urinary tract infections     on prophalaytic medication   . Osteopenia     Past Surgical History  Procedure Laterality Date  . Appendectomy  03/2011    laparoscopic  . Repair of rt med meniscal tear    . Skin cancer excision  right upper arm  . Left tibia      for osteomelitis of left tibia at age 4  . Colonscopy     . Total knee arthroplasty Bilateral 03/09/2015    Procedure: BILATERAL TOTAL KNEE ARTHROPLASTY;  Surgeon: Gaynelle Arabian, MD;  Location: [facility];  Service: Orthopedics;  Laterality: Bilateral;  . Cataract extraction, bilateral  06/2015    Family History  Problem Relation Age of Onset  . Colon cancer Neg Hx   . Esophageal cancer Neg Hx   . Stomach cancer Neg Hx   . Rectal cancer Neg Hx   . Thyroid disease Neg Hx   . Diabetes Mother     borderline type II  . Hypertension Mother   . Arthritis Mother   . Hypertension Father   . Stroke Father   . COPD Father   . Hypertension Other     all grandparents  . Stroke Maternal Grandmother   . Osteoporosis Maternal Grandmother   . Stroke Paternal Grandfather   . Aneurysm Maternal Aunt 63  . Hypertension Sister   . Osteoarthritis Sister   . Hypertension Brother   . Osteoarthritis Brother     Social History:  reports that she quit smoking about 22 years ago. Her smoking use included Cigarettes. She has a 40 pack-year  smoking history. She has never used smokeless tobacco. She reports that she drinks about 12.0 oz of alcohol per week. She reports that she uses illicit drugs.  REVIEW Of SYSTEMS:  She is on antihypertensives and is followed by PCP     Examination:   Ht 5' 6.5" (1.689 m)  Wt 149 lb (67.586 kg)  BMI 23.69 kg/m2  LMP 01/01/2008  She looks well  NECK: Thyroid is enlarged on the left side, has a soft to firm area about 2 cm felt better on swallowing Right side is  barely palpable and is soft  . No lymphadenopathy in the neck  NEUROLOGIC EXAM:  biceps reflexes show normal relaxation    Assessment   History of multinodular goiter, long-standing since at least 2003 with largest nodule on the left side, previously 1.6 cm  Her goiter is relatively small and the left-sided nodule feels about the same in size as before and relatively soft Her TSH is trending slightly lower and not clear if she may be developing an autonomous thyroid   Plan: Reassured her that that since she has had a very long-standing stable thyroid nodule clinically this does not need any further investigation  Check thyroid levels in follow-up in 1 year  [name] 04/12/2016, 11:01 AM

## 2016-04-13 ENCOUNTER — Ambulatory Visit: Payer: 59 | Admitting: Endocrinology

## 2016-05-07 ENCOUNTER — Other Ambulatory Visit: Payer: Self-pay | Admitting: Internal Medicine

## 2016-10-12 ENCOUNTER — Encounter: Payer: Self-pay | Admitting: Obstetrics & Gynecology

## 2016-10-19 ENCOUNTER — Encounter: Payer: Self-pay | Admitting: Obstetrics & Gynecology

## 2016-10-19 ENCOUNTER — Ambulatory Visit (INDEPENDENT_AMBULATORY_CARE_PROVIDER_SITE_OTHER): Payer: 59 | Admitting: Obstetrics & Gynecology

## 2016-10-19 ENCOUNTER — Telehealth: Payer: Self-pay | Admitting: Obstetrics & Gynecology

## 2016-10-19 ENCOUNTER — Ambulatory Visit: Payer: 59 | Admitting: Obstetrics and Gynecology

## 2016-10-19 VITALS — BP 120/86 | HR 86 | Ht 66.0 in | Wt 150.0 lb

## 2016-10-19 DIAGNOSIS — Z01419 Encounter for gynecological examination (general) (routine) without abnormal findings: Secondary | ICD-10-CM | POA: Diagnosis not present

## 2016-10-19 DIAGNOSIS — N951 Menopausal and female climacteric states: Secondary | ICD-10-CM | POA: Diagnosis not present

## 2016-10-19 DIAGNOSIS — Z79890 Hormone replacement therapy (postmenopausal): Secondary | ICD-10-CM | POA: Diagnosis not present

## 2016-10-19 DIAGNOSIS — M8589 Other specified disorders of bone density and structure, multiple sites: Secondary | ICD-10-CM | POA: Diagnosis not present

## 2016-10-19 MED ORDER — ESTRADIOL 0.05 MG/24HR TD PTTW: 1.0000 | MEDICATED_PATCH | TRANSDERMAL | 0 refills | Status: DC

## 2016-10-19 NOTE — Telephone Encounter (Signed)
Patient was seen today and when to pick up her prescription and the instructions are completely different. She also received a 4 week supply not the 3 months she thought she was going to receive.

## 2016-10-19 NOTE — Telephone Encounter (Signed)
Spoke with patient. Patient calling to clarify estradiol prescription that was given today. Patient states she picked up estradiol and there are only 8 patches and she understood there to be 3 months supply. Advised patient to look at refills left, patient states 1 RF. Advised patient Dr. Sabra Heck did send prescription for #24 patches. Advised patient to follow[facility]up with [facility] and return call with any additional questions/concerns.     Routing to provider for final review. Patient is agreeable to disposition. Will close encounter.

## 2016-10-19 NOTE — Progress Notes (Signed)
60 y.o. G2P2 MarriedCaucasianF here for annual exam.  Pt reports right toe fracture in January that pt states occurred with minimal trauma.  She's just had a recent BMD with 9% loss of bone density.  States her PCP has highly encouraged her about starting HRT.  Pt states she has done "a lot of research about HRT" and she is "very aware" of the risks and she is completely willing to accept this.  She feels like she was not heard after her last AEX.  She feels she is capable of making this decision for herself and really wants to have an intelligent conversation regarding this.  H/O recurrent UTIs since going into menopause.  She is concerned about the change in her recent bone density.  Although there is just osteopenia, there was significant change from prior BMD.  Pt has done a lot of research and is knowledgable about risks.   We did extensively discuss risks including breast cancer, DVT/PE, stroke, MI.  She is willing to accept all of these if her quality of life would improve.  She's also done a lot of research regarding progesterones.  She would like a Mirena IUD.  She is aware her insurance may not cover this.  States she can pay for it if needed.  Desires placement today.  Denies vaginal bleeding.    Patient's last menstrual period was 01/01/2008.          Sexually active: Yes.    The current method of family planning is post menopausal status.    Exercising: Yes.    cardio, weight training, resistance  3x weekly Smoker:  no  Health Maintenance: Pap:  10/12/15 Neg. HR HPV:neg  History of abnormal Pap:  no MMG:  10/25/15 [place] Left BIRADS2:Benign.  Doing 3D. Colonoscopy:  03/19/12 Normal - f/u 10 years  BMD:   11/24/13 Normal  TDaP:  06/2009 Pneumonia vaccine(s):  No Zostavax:   2015 (pt did with local pharmacy) Hep C testing: 11/14 Screening Labs: PCP, Urine today: PCP   reports that she quit smoking about 22 years ago. Her smoking use included Cigarettes. She has a 40.00 pack-year smoking  history. She has never used smokeless tobacco. She reports that she drinks about 12.0 oz of alcohol per week . She reports that she uses drugs.  Past Medical History:  Diagnosis Date  . Arthritis    osteoarthritis  . Cancer ([place])    melanoma- right upper arm  . History of frequent urinary tract infections    on prophalaytic medication   . Hypertension   . Osteomyelitis (HCC)    left leg  . Osteopenia   . Renal cyst    benign, found on CT scan, is being followed  . Spinal stenosis    mild hx of herniated disc  . Thyroid disease     nodule, hypothyroidism pt was on synthyroid but is not having to take currently pt states her MD states she does not have issues with     Past Surgical History:  Procedure Laterality Date  . APPENDECTOMY  03/2011   laparoscopic  . CATARACT EXTRACTION, BILATERAL  06/2015  . colonscopy     . left tibia     for osteomelitis of left tibia at age 18  . repair of rt med meniscal tear    . SKIN CANCER EXCISION     right upper arm  . TOTAL KNEE ARTHROPLASTY Bilateral 03/09/2015   Procedure: BILATERAL TOTAL KNEE ARTHROPLASTY;  Surgeon: Gaynelle Arabian, MD;  Location: [facility];  Service: Orthopedics;  Laterality: Bilateral;    Current Outpatient Prescriptions  Medication Sig Dispense Refill  . Biotin 1 MG CAPS Take 1 tablet by mouth daily.    Marland Kitchen BLACK COHOSH PO Take 1 tablet by mouth daily.    . chlorthalidone (HYGROTON) 25 MG tablet Take 25 mg by mouth daily. Takes 1/2 tablet daily    . Cholecalciferol (VITAMIN D3) 5000 units TABS Take 1 tablet by mouth daily.    . Cranberry 500 MG CAPS Take 1 tablet by mouth daily.    Marland Kitchen estradiol (ESTRING) 2 MG vaginal ring Place 2 mg vaginally every 3 (three) months. 1 each 3  . losartan (COZAAR) 100 MG tablet TAKE 1 TABLET DAILY 90 tablet 1  . Multiple Vitamins-Minerals (CENTRUM ULTRA WOMENS PO) Take 1 tablet by mouth daily.     No current facility-administered medications for this visit.     Family History  Problem  Relation Age of Onset  . Diabetes Mother     borderline type II  . Hypertension Mother   . Arthritis Mother   . Hypertension Father   . Stroke Father   . COPD Father   . Hypertension Sister   . Osteoarthritis Sister   . Hypertension Brother   . Osteoarthritis Brother   . Hypertension Other     all grandparents  . Stroke Maternal Grandmother   . Osteoporosis Maternal Grandmother   . Stroke Paternal Grandfather   . Aneurysm Maternal Aunt 63  . Colon cancer Neg Hx   . Esophageal cancer Neg Hx   . Stomach cancer Neg Hx   . Rectal cancer Neg Hx   . Thyroid disease Neg Hx     ROS:  Pertinent items are noted in HPI.  Otherwise, a comprehensive ROS was negative.  Exam:   BP 120/86 (BP Location: Right Arm, Patient Position: Sitting, Cuff Size: Normal)   Pulse 86   Ht 5\' 6"  (1.676 m)   Wt 150 lb (68 kg)   LMP 01/01/2008   BMI 24.21 kg/m     Height: 5\' 6"  (167.6 cm)  Ht Readings from Last 3 Encounters:  10/19/16 5\' 6"  (1.676 m)  04/12/16 5' 6.5" (1.689 m)  10/12/15 5' 6.5" (1.689 m)    General appearance: alert, cooperative and appears stated age Head: Normocephalic, without obvious abnormality, atraumatic Neck: no adenopathy, supple, symmetrical, trachea midline and thyroid normal to inspection and palpation Lungs: clear to auscultation bilaterally Breasts: normal appearance, no masses or tenderness Heart: regular rate and rhythm Abdomen: soft, non-tender; bowel sounds normal; no masses,  no organomegaly Extremities: extremities normal, atraumatic, no cyanosis or edema Skin: Skin color, texture, turgor normal. No rashes or lesions Lymph nodes: Cervical, supraclavicular, and axillary nodes normal. No abnormal inguinal nodes palpated Neurologic: Grossly normal   Pelvic: External genitalia:  no lesions              Urethra:  normal appearing urethra with no masses, tenderness or lesions              Bartholins and Skenes: normal                 Vagina: normal appearing  vagina with normal color and discharge, no lesions              Cervix: no lesions              Pap taken: No. Bimanual Exam:  Uterus:  normal size, contour, position, consistency, mobility,  non-tender              Adnexa: normal adnexa and no mass, fullness, tenderness               Rectovaginal: Confirms               Anus:  normal sphincter tone, no lesions  Discussed with pt options for HRT.  Decided to start with estradiol patch.  Again, risks and benefits clearly reviewed.  She desires IUD placement today.  Does not want this precerted before placement.  Consent obtained.  Procedure:  Speculum placed.  Cervix cleansed with Betadine x 3.  Tenaculum applied to anterior lip of cervix.  Uterus sounded to 6 cm.  Pt aware she may have discomfort with the IUD due to size of cavity.  Still desired placement.  IUD removed from package and passed to fundus.  This was slightly withdrawn and IUD passed into endometrial cavity.  Inserter removed.  String cut to 2cm.  Tenaculum removed.  Spotting only noted.  Pt tolerated procedure well.  Lot #:  [id number], exp:  4/20.  Chaperone was present for exam.  A:  Well woman visit with normal exam. Hx recurrent UTIs.  H/O estring use. Hx osteopenia Thyroid nodules.  Followed by endocrinology. Desires starting HRT.  See discussion above.  Plan: Mammogram recommendations reviewed.  Yearly MMG recommended due to HRT use. Estradiol 0.05mg  patch, 1 to skin twice weekly.  #24/0RF.  Recheck 3 months.  Mirena IUD placement today.  Will recheck strings at follow up appt. Pap and HR HPV 2016.  No pap today.   Will repeat BMD two years Labs all done with executive exam at [facility]. Follow up annually and as above or with any new issues.   In additional to AEX and IUD placement, additional 15 minutes spent discussing recent BMD as well as HRT options and risks.

## 2016-10-22 ENCOUNTER — Other Ambulatory Visit: Payer: Self-pay | Admitting: Obstetrics & Gynecology

## 2016-10-22 ENCOUNTER — Ambulatory Visit
Admission: RE | Admit: 2016-10-22 | Discharge: 2016-10-22 | Disposition: A | Discharge status: Home / Self Care | Payer: 59 | Source: Ambulatory Visit | Attending: Obstetrics & Gynecology | Admitting: Obstetrics & Gynecology

## 2016-10-22 DIAGNOSIS — Z1231 Encounter for screening mammogram for malignant neoplasm of breast: Secondary | ICD-10-CM

## 2016-10-28 ENCOUNTER — Encounter: Payer: Self-pay | Admitting: Obstetrics & Gynecology

## 2016-11-03 ENCOUNTER — Other Ambulatory Visit: Payer: Self-pay | Admitting: Internal Medicine

## 2016-11-05 NOTE — Telephone Encounter (Signed)
Needs PE appt or at least OV  as she goes to [facility]. Should [facility] refill this?

## 2016-11-08 ENCOUNTER — Other Ambulatory Visit: Payer: Self-pay | Admitting: Obstetrics and Gynecology

## 2016-11-08 NOTE — Telephone Encounter (Signed)
Medication refill request: Estring   Last AEX:  10-19-16  Next AEX: 10-21-17  Last MMG (if hormonal medication request):10-23-16 WNL  Refill authorized: please advise

## 2016-11-12 ENCOUNTER — Encounter: Payer: Self-pay | Admitting: Obstetrics & Gynecology

## 2016-11-13 ENCOUNTER — Telehealth: Payer: Self-pay

## 2016-11-13 DIAGNOSIS — Z30431 Encounter for routine checking of intrauterine contraceptive device: Secondary | ICD-10-CM

## 2016-11-13 NOTE — Telephone Encounter (Signed)
Spoke with patient regarding scheduling PUS for IUD check up. Please see MyChart message dated 11/12/2016 with recommendations from [place]. PUS scheduled for 11/15/2016 at 1:30 pm with 2 pm consult with Dr.Miller. Patient is agreeable to date and time. Order placed for precert.  Routing to provider for final review. Patient agreeable to disposition. Will close encounter.

## 2016-11-13 NOTE — Telephone Encounter (Signed)
Spoke with patient. PUS appointment scheduled for 11/15/2016 at 1:30 pm with 2 pm consult with Dr.Miller.

## 2016-11-15 ENCOUNTER — Ambulatory Visit (INDEPENDENT_AMBULATORY_CARE_PROVIDER_SITE_OTHER): Payer: Managed Care, Other (non HMO) | Admitting: Obstetrics & Gynecology

## 2016-11-15 ENCOUNTER — Encounter: Payer: Self-pay | Admitting: Obstetrics & Gynecology

## 2016-11-15 ENCOUNTER — Ambulatory Visit (INDEPENDENT_AMBULATORY_CARE_PROVIDER_SITE_OTHER): Payer: Managed Care, Other (non HMO)

## 2016-11-15 VITALS — BP 142/84 | HR 86 | Resp 18 | Ht 66.5 in | Wt 150.0 lb

## 2016-11-15 DIAGNOSIS — R102 Pelvic and perineal pain: Secondary | ICD-10-CM

## 2016-11-15 DIAGNOSIS — Z30431 Encounter for routine checking of intrauterine contraceptive device: Secondary | ICD-10-CM

## 2016-11-15 DIAGNOSIS — N950 Postmenopausal bleeding: Secondary | ICD-10-CM

## 2016-11-15 DIAGNOSIS — N939 Abnormal uterine and vaginal bleeding, unspecified: Secondary | ICD-10-CM | POA: Diagnosis not present

## 2016-11-15 DIAGNOSIS — Z30432 Encounter for removal of intrauterine contraceptive device: Secondary | ICD-10-CM | POA: Diagnosis not present

## 2016-11-15 MED ORDER — ESTRADIOL-NORETHINDRONE ACET 0.05-0.14 MG/DAY TD PTTW: 1.0000 | MEDICATED_PATCH | TRANSDERMAL | 12 refills | Status: DC

## 2016-11-15 NOTE — Progress Notes (Signed)
60 y.o. G2P2 Married Caucasian female here for pelvic ultrasound due to persistent pelvic cramping since placement of IUD.  Pt felt it would gradually improve but seems to have gradually worsened.  She is also having continued spotting/bleeding now with IUD.  Was using this for endometrial protection with initiation of HRT.  She was very adamant at her last visit about having an IUD placed for this.  Wants to make sure everything is ok.  Patient's last menstrual period was 01/01/2008.  Contraception: PMP  Findings:  UTERUS: 7.0 x 4.3 x 3.6cm with IUD in correct positioning EMS:2.47mm ADNEXA: Left ovary: 1.7 x 0.9 x 0.6cm       Right ovary: 1.5 x 1.1 x 0.6cm.  Atrophic ovaries. CUL DE SAC: no free fluid  Discussion:  With IUD in correct position, there are no options but to leave it in and use anti-inflammatories to see if can improve cramping or to remove IUD.  Pt desires removal.  So, speculum placed and IUD string noted.  With one pull, IUD removed without difficult.  BME performed.  Uterus non tender without CMT.  Assessment:  Cramping and bleeding since IUD placement On HRT S/p IUD removal today  Plan:  Will change HRT to combipatch 0.5/250 and she will use 1/2 patch twice weekly.   Pt has follow up in about two months.  Will change this to three months.    ~15 minutes spent with patient >50% of time was in face to face discussion of above.

## 2016-12-28 ENCOUNTER — Encounter: Payer: Self-pay | Admitting: Obstetrics & Gynecology

## 2017-01-11 ENCOUNTER — Encounter: Payer: Self-pay | Admitting: Obstetrics & Gynecology

## 2017-01-21 ENCOUNTER — Ambulatory Visit: Payer: Self-pay | Admitting: Obstetrics & Gynecology

## 2017-02-14 ENCOUNTER — Ambulatory Visit (INDEPENDENT_AMBULATORY_CARE_PROVIDER_SITE_OTHER): Payer: Managed Care, Other (non HMO) | Admitting: Obstetrics & Gynecology

## 2017-02-14 ENCOUNTER — Encounter: Payer: Self-pay | Admitting: Obstetrics & Gynecology

## 2017-02-14 VITALS — BP 118/72 | HR 70 | Resp 12 | Ht 66.5 in | Wt 152.0 lb

## 2017-02-14 DIAGNOSIS — M8080XA Other osteoporosis with current pathological fracture, unspecified site, initial encounter for fracture: Secondary | ICD-10-CM

## 2017-02-14 DIAGNOSIS — N950 Postmenopausal bleeding: Secondary | ICD-10-CM | POA: Diagnosis not present

## 2017-02-14 DIAGNOSIS — Z79890 Hormone replacement therapy (postmenopausal): Secondary | ICD-10-CM

## 2017-02-14 MED ORDER — RALOXIFENE HCL 60 MG PO TABS: 60.0000 mg | ORAL_TABLET | Freq: Every day | ORAL | 2 refills | Status: DC

## 2017-02-14 MED ORDER — ESTRADIOL 2 MG VA RING: 2.0000 mg | VAGINAL_RING | VAGINAL | 4 refills | Status: DC

## 2017-02-14 NOTE — Progress Notes (Signed)
GYNECOLOGY  VISIT   HPI: 61 y.o. G2P2 Married Caucasian female here for follow up after starting HRT and having an IUD placed.  She wanted to start estrogen due to fragility fracture in her foot.  IUD for progesterone was her desire as well.  She did not tolerate the presence of the IUD well due to "always feeling it" as well as having spotting the few months it was placed.  This was ultimately removed and she was started on oral progesterone with transdermal (patch) estrogen.  She began cycling with this and ultimately has decided to stop this altogether.  I am in agreement with her decision.  She is desirous of going back on her estring for vaginal health and to help prevent painful intercourse.  She does need an RX for this today.  She now reports she has experienced another, non traumatic, fragility fracture in her foot.  This occurred with essentially no abnormal movements and no trauma.  She is very anxious about her bone health.  She did have osteopenia on her most recent BMD.  She is also taking extra Vit D.  Pt is also taking a MVI with calcium.  Treatment options for osteoporosis (due to fragility fracture) discussed including bisphosphonates, Evista, and Prolia.  Doubtful will be able to get Prolia or Reclast covered for her due to t score being still in osteopenia range.  She does not want to take Fosamax or Actonel.  Evista, dosing, and side effects discussed including thromboembolic events such as DVT, PE, stroke, MI.  Antiestrogen properties and decreased breast cancer association reviewed.  Pt would like to try this.  GYNECOLOGIC HISTORY: Patient's last menstrual period was 01/01/2008. Contraception: PMP Hormonal therapy:  Will restart estring  Patient Active Problem List   Diagnosis Date Noted  . Status post bilateral knee replacements 03/14/2015  . OA (osteoarthritis) of knee 03/09/2015  . Preoperative cardiovascular examination 01/06/2015  . Hypertension 02/17/2012  .  Multinodular goiter 02/17/2012  . Spinal stenosis of lumbar region 02/17/2012  . 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 Menopause 08/18/2010  . BP (high blood pressure) 08/19/2007  . Malignant melanoma ([place]) 08/19/2007  . Adult hypothyroidism 08/19/2003  . Arthritis of knee, degenerative 11/25/1999    Past Medical History:  Diagnosis Date  . Arthritis    osteoarthritis  . Cancer ([place])    melanoma- right upper arm  . History of frequent urinary tract infections    on prophalaytic medication   . Hypertension   . Osteomyelitis (HCC)    left leg  . Osteopenia   . Renal cyst    benign, found on CT scan, is being followed  . Spinal stenosis    mild hx of herniated disc  . Thyroid disease     nodule, hypothyroidism pt was on synthyroid but is not having to take currently pt states her MD states she does not have issues with     Past Surgical History:  Procedure Laterality Date  . APPENDECTOMY  03/2011   laparoscopic  . CATARACT EXTRACTION, BILATERAL  06/2015  . colonscopy     . left tibia     for osteomelitis of left tibia at age 56  . repair of rt med meniscal tear    . SKIN CANCER EXCISION     right upper arm  . TOTAL KNEE ARTHROPLASTY Bilateral 03/09/2015   Procedure: BILATERAL TOTAL KNEE ARTHROPLASTY;  Surgeon: Gaynelle Arabian, MD;  Location: [facility];  Service: Orthopedics;  Laterality: Bilateral;    MEDS:  Reviewed in EPIC and UTD  ALLERGIES: Hydrocodone  Family History  Problem Relation Age of Onset  . Diabetes Mother     borderline type II  . Hypertension Mother   . Arthritis Mother   . Hypertension Father   . Stroke Father   . COPD Father   . Hypertension Sister   . Osteoarthritis Sister   . Hypertension Brother   . Osteoarthritis Brother   . Hypertension Other     all grandparents  . Stroke Maternal Grandmother   . Osteoporosis Maternal Grandmother   . Stroke Paternal Grandfather   . Aneurysm Maternal Aunt 63  . Colon cancer  Neg Hx   . Esophageal cancer Neg Hx   . Stomach cancer Neg Hx   . Rectal cancer Neg Hx   . Thyroid disease Neg Hx     SH:  Married, former smoker  Review of Systems  All other systems reviewed and are negative.   PHYSICAL EXAMINATION:    BP 118/72 (BP Location: Right Arm, Patient Position: Sitting, Cuff Size: Normal)   Pulse 70   Resp 12   Ht 5' 6.5" (1.689 m)   Wt 152 lb (68.9 kg)   LMP 01/01/2008   BMI 24.17 kg/m      Physical Exam  Constitutional: She is oriented to person, place, and time. She appears well-developed and well-nourished.  Neurological: She is alert and oriented to person, place, and time.  Skin: Skin is warm and dry.  Psychiatric: She has a normal mood and affect.   Chaperone was present for exam.  Assessment: H/O PMP bleeding while on HRT which has been stopped (and bleeding has ceased as well) Osteoporosis with two fragility fractures in feet  Plan: Restart Estring 1mg  pv every 3 months.  rx to pharmacy. Evista 60mg  daily.  #30/2RF.  She will call with update and if wants new rx to mail order.  Also, she knows to call with any bleeding.  All questions answered.     ~25 minutes spent with patient >50% of time was in face to face discussion of above.

## 2017-03-25 ENCOUNTER — Encounter: Payer: Self-pay | Admitting: Obstetrics & Gynecology

## 2017-04-09 ENCOUNTER — Other Ambulatory Visit (INDEPENDENT_AMBULATORY_CARE_PROVIDER_SITE_OTHER): Payer: Managed Care, Other (non HMO)

## 2017-04-09 DIAGNOSIS — E042 Nontoxic multinodular goiter: Secondary | ICD-10-CM

## 2017-04-09 LAB — T4, FREE: FREE T4: 1.08 ng/dL (ref 0.60–1.60)

## 2017-04-09 LAB — T3, FREE: T3 FREE: 4.7 pg/mL — AB (ref 2.3–4.2)

## 2017-04-09 LAB — TSH: TSH: 0.55 u[IU]/mL (ref 0.35–4.50)

## 2017-04-12 ENCOUNTER — Encounter: Payer: Self-pay | Admitting: Endocrinology

## 2017-04-12 ENCOUNTER — Ambulatory Visit (INDEPENDENT_AMBULATORY_CARE_PROVIDER_SITE_OTHER): Payer: Managed Care, Other (non HMO) | Admitting: Endocrinology

## 2017-04-12 VITALS — BP 128/82 | HR 79 | Ht 67.0 in | Wt 149.0 lb

## 2017-04-12 DIAGNOSIS — E042 Nontoxic multinodular goiter: Secondary | ICD-10-CM | POA: Diagnosis not present

## 2017-04-12 NOTE — Progress Notes (Signed)
Patient ID: Shandra Szymborski, female   DOB: 1956-10-15, 61 y.o.   MRN: [id number]   Reason for Appointment:  Goiter, followup visit    History of Present Illness:   The goiter was first found in the year 2003 incidentally on a routine exam At that time she was in [place] and her endocrinologist evaluated her with serial ultrasounds She had multiple nodules with the largest one being 1.6 cm on the left side She was started on thyroid suppression Synthroid 50 mcg daily She did not feel any different with starting the thyroid supplement and her records do not indicate that she had hypothyroidism  Because of low TSH in 4/15 her Synthroid was stopped  Subsequently her TSH level has been normal, unchanged from last year   She herself has not noticed any swelling in her neck. No symptoms of local discomfort, difficulty swallowing, no choking sensation. She is now here for her annual follow-up  Thyroid levels:  Lab Results  Component Value Date   TSH 0.55 04/09/2017   TSH 0.53 04/10/2016   TSH 1.28 04/13/2015   FREET4 1.08 04/09/2017   FREET4 1.12 04/10/2016   FREET4 0.97 04/13/2015      Allergies as of 04/12/2017      Reactions   Hydrocodone Other (See Comments)   Makes me goofy      Medication List       Accurate as of 04/12/17 11:30 AM. Always use your most recent med list.          Biotin 1 MG Caps Take 1 tablet by mouth daily.   BLACK COHOSH PO Take 1 tablet by mouth daily.   CENTRUM ULTRA WOMENS PO Take 1 tablet by mouth daily.   chlorthalidone 25 MG tablet Commonly known as:  HYGROTON Take 25 mg by mouth daily. Takes 1/2 tablet daily   Cranberry 500 MG Caps Take 1 tablet by mouth daily.   estradiol 2 MG vaginal ring Commonly known as:  ESTRING Place 2 mg vaginally every 3 (three) months. follow package directions   losartan 100 MG tablet Commonly known as:  COZAAR TAKE 1 TABLET DAILY   raloxifene 60 MG tablet Commonly known as:   EVISTA Take 1 tablet (60 mg total) by mouth daily.   tobramycin-dexamethasone ophthalmic solution Commonly known as:  TOBRADEX   Vitamin D3 5000 units Tabs Take 1 tablet by mouth daily.       Allergies:  Allergies  Allergen Reactions  . Hydrocodone Other (See Comments)    Makes me goofy    Past Medical History:  Diagnosis Date  . Arthritis    osteoarthritis  . Cancer ([facility])    melanoma- right upper arm  . History of frequent urinary tract infections    on prophalaytic medication   . Hypertension   . Osteomyelitis ([facility]CC)    left leg  . Osteopenia   . Renal cyst    benign, found on CT scan, is being followed  . Spinal stenosis    mild hx of herniated disc  . Thyroid disease     nodule, hypothyroidism pt was on synthyroid but is not having to take currently pt states her MD states she does not have issues with     Past Surgical History:  Procedure Laterality Date  . APPENDECTOMY  03/2011   laparoscopic  . CATARACT EXTRACTION, BILATERAL  06/2015  . colonscopy     . left tibia     for osteomelitis of left tibia  at age 28  . repair of rt med meniscal tear    . SKIN CANCER EXCISION     right upper arm  . TOTAL KNEE ARTHROPLASTY Bilateral 03/09/2015   Procedure: BILATERAL TOTAL KNEE ARTHROPLASTY;  Surgeon: Gaynelle Arabian, MD;  Location: [facility];  Service: Orthopedics;  Laterality: Bilateral;    Family History  Problem Relation Age of Onset  . Diabetes Mother     borderline type II  . Hypertension Mother   . Arthritis Mother   . Hypertension Father   . Stroke Father   . COPD Father   . Hypertension Sister   . Osteoarthritis Sister   . Hypertension Brother   . Osteoarthritis Brother   . Hypertension Other     all grandparents  . Stroke Maternal Grandmother   . Osteoporosis Maternal Grandmother   . Stroke Paternal Grandfather   . Aneurysm Maternal Aunt 63  . Colon cancer Neg Hx   . Esophageal cancer Neg Hx   . Stomach cancer Neg Hx   . Rectal cancer Neg Hx    . Thyroid disease Neg Hx     Social History:  reports that she quit smoking about 23 years ago. Her smoking use included Cigarettes. She has a 40.00 pack-year smoking history. She has never used smokeless tobacco. She reports that she drinks about 12.0 oz of alcohol per week . She reports that she uses drugs.  REVIEW Of SYSTEMS:  She is on antihypertensives and is followed by PCP  Also followed by [facility] for wellness   Examination:   BP 128/82   Pulse 79   Ht 5\' 7"  (1.702 m)   Wt 149 lb (67.6 kg)   LMP 01/01/2008   BMI 23.34 kg/m   She looks well   Thyroid is enlarged on the left side, has a fleshy nodule 2-2.5 cm with firmness medially Right side is not palpable  No lymphadenopathy in the neck    Assessment   History of multinodular goiter, long-standing since at least 2003 with largest nodule on the left side, previously 1.6 cm  Her goiter is relatively small and the left-sided nodule feels about the same in size as before No change in texture TSH is still low normal and unchanged    Plan: Continue to monitor the goiter clinically annually Check thyroid levels in follow-up in 1 year  Jahmad Petrich 04/12/2017, 11:30 AM

## 2017-04-29 ENCOUNTER — Ambulatory Visit (INDEPENDENT_AMBULATORY_CARE_PROVIDER_SITE_OTHER): Payer: Managed Care, Other (non HMO) | Admitting: Internal Medicine

## 2017-04-29 ENCOUNTER — Encounter: Payer: Self-pay | Admitting: Internal Medicine

## 2017-04-29 VITALS — BP 110/80 | HR 73 | Temp 98.3°F | Ht 67.0 in | Wt 148.0 lb

## 2017-04-29 DIAGNOSIS — I951 Orthostatic hypotension: Secondary | ICD-10-CM | POA: Diagnosis not present

## 2017-04-29 NOTE — Progress Notes (Signed)
   Subjective:    Patient ID: Tara Stanley, female    DOB: 1956/04/04, 61 y.o.   MRN: [id number]  HPI 61 year old Female  with history of hypertension in  today complaining of lghtheadedness with position change. Last year while in [place], had episode episode of what sounds like vertigo that lasted for 4 days. It was true room spinning dizziness and had a fairly abrupt onset. It finally resolved. She never took any medication for it.  Patient denies being under stress. Her daughters is getting married in June in [place]. She leaves the end of May and will be living in [place] this summer at their [place] home.  Had a foot stress fracture and fractured fibula last year. In August 2017 had bone density study and lowest T score -1.5 and was started on Evista by Dr. Edwinna Areola. Next check up at [facility] is November. She goes yearly to [facility] for an executive physical exam.  Hx of melanoma-has regular checks by Dr. Amy Martinique  Hx goiter followed by Dr. Dwyane Dee  Lab work done at [facility] in November was entirely within normal limits.  She is on losartan 100 mg daily and Hygroton 12.5 mg daily.           Review of Systems see above. She exercises regularly and is in excellent physical condition.     Objective:   Physical Exam Blood pressure lying is 120/80 with pulse 69, blood pressure sitting is 120/82 with pulse 72. Blood pressure standing is 110/78 with pulse of 88.  Funduscopic exam is benign. Neurological exam is within normal limits without gross focal deficits. Extraocular movements are full. Muscle strength is normal in upper and lower extremities. Pharynx and TMs are clear. Neck is supple without thyromegaly. Chest clear. Cardiac exam regular rate and rhythm normal S1 and S2.       Assessment & Plan:  Suspect orthostatic hypotension  Plan: Patient will stop hypertonic and return in one week for further evaluation and assessment.

## 2017-04-29 NOTE — Patient Instructions (Signed)
Discontinue Hygroton 12.5 mg daily and return in 1 week for reevaluation.

## 2017-05-09 ENCOUNTER — Ambulatory Visit: Payer: Managed Care, Other (non HMO) | Admitting: Internal Medicine

## 2017-05-14 ENCOUNTER — Ambulatory Visit (INDEPENDENT_AMBULATORY_CARE_PROVIDER_SITE_OTHER): Payer: Managed Care, Other (non HMO) | Admitting: Internal Medicine

## 2017-05-14 ENCOUNTER — Encounter: Payer: Self-pay | Admitting: Internal Medicine

## 2017-05-14 VITALS — BP 110/80 | HR 70 | Temp 98.6°F | Ht 67.0 in | Wt 146.0 lb

## 2017-05-14 DIAGNOSIS — R42 Dizziness and giddiness: Secondary | ICD-10-CM | POA: Diagnosis not present

## 2017-05-14 DIAGNOSIS — I951 Orthostatic hypotension: Secondary | ICD-10-CM | POA: Diagnosis not present

## 2017-05-14 MED ORDER — MECLIZINE HCL 25 MG PO TABS: 25.0000 mg | ORAL_TABLET | Freq: Three times a day (TID) | ORAL | 0 refills | Status: DC | PRN

## 2017-05-14 NOTE — Progress Notes (Signed)
   Subjective:    Patient ID: Tara Stanley, female    DOB: 1956/10/01, 61 y.o.   MRN: [id number]  HPI Patient was seen April 30 with lightheadedness with position change. It seemed that she had had an episode in 2017 while in [place] with room spinning dizziness that lasted for 4 days. This was less like that but more of a lightheadedness. We decided to discontinue her diuretic and she is here for follow-up. She has long-standing history of hypertension which is well-controlled. She exercises regularly and watches her weight.  She has started waiting just a second before arising to a standing position from sitting. Has not had any issues with dizziness or vertigo since stopping diuretic. She feels well. Has wetting in [place] for her daughter around June 2. His been stressful.  She brings in multiple blood pressure readings today.    Review of Systems     Objective:   Physical Exam  She is not orthostatic today. Chest clear. Cor RRR No focal deficits on briet neuro exam       Assessment & Plan:  Essential HTN- stay off diuretic and continue to monitor BP  Benign positional vertigo- If sxs persist, consider MRI of brain.  Pt will consider MRI. Will monitor BP and let me know if Sxs recur

## 2017-05-14 NOTE — Patient Instructions (Signed)
Patient will continue to monitor her blood pressure. She will stay off diuretic. She will let me know if symptoms persist and if she wants to pursue further workup.

## 2017-05-20 ENCOUNTER — Other Ambulatory Visit: Payer: Self-pay | Admitting: Obstetrics & Gynecology

## 2017-05-20 NOTE — Telephone Encounter (Signed)
Medication refill request: Evista  Last AEX:  10-19-16  Next AEX: 10-21-17  Last MMG (if hormonal medication request): 10-22-16 WNL  Refill authorized: please advise

## 2017-05-28 ENCOUNTER — Encounter: Payer: Self-pay | Admitting: Obstetrics & Gynecology

## 2017-05-28 ENCOUNTER — Other Ambulatory Visit: Payer: Self-pay | Admitting: Obstetrics & Gynecology

## 2017-05-28 ENCOUNTER — Telehealth: Payer: Self-pay | Admitting: *Deleted

## 2017-05-28 MED ORDER — RALOXIFENE HCL 60 MG PO TABS: 60.0000 mg | ORAL_TABLET | Freq: Every day | ORAL | 2 refills | Status: DC

## 2017-05-28 NOTE — Telephone Encounter (Signed)
Med refill request: raloxifene 60mg  Last AEX: 10/19/16 Next AEX: 10/21/17 Last MMG : 10/22/16, Bi-rads 1, neg Refill authorized: Dr. Sabra Heck, please advise on refill?      Non-Urgent Medical Question  Message [phone]  From Tara Stanley To [name], MD Sent 05/28/2017 8:11 AM  I need a new 90-day refill prescription for Raloxifene HCL 60 mg. This prescription must go through [facility] from now on. Thank you!

## 2017-05-28 NOTE — Telephone Encounter (Signed)
RF for Raloxifene 60mg  daily, #90/2RF sent to [facility].  Ok to notify pt via Mychart.

## 2017-05-28 NOTE — Telephone Encounter (Signed)
See telephone encounter dated 05/28/17 to review with provider.

## 2017-05-28 NOTE — Telephone Encounter (Signed)
MyChart message to patient notifying of refill, will close encounter.

## 2017-10-17 ENCOUNTER — Other Ambulatory Visit: Payer: Self-pay | Admitting: Obstetrics & Gynecology

## 2017-10-17 DIAGNOSIS — Z1231 Encounter for screening mammogram for malignant neoplasm of breast: Secondary | ICD-10-CM

## 2017-10-21 ENCOUNTER — Ambulatory Visit: Payer: Self-pay | Admitting: Obstetrics & Gynecology

## 2017-11-01 ENCOUNTER — Encounter: Payer: Self-pay | Admitting: Obstetrics & Gynecology

## 2017-11-01 ENCOUNTER — Ambulatory Visit (INDEPENDENT_AMBULATORY_CARE_PROVIDER_SITE_OTHER): Payer: Managed Care, Other (non HMO) | Admitting: Obstetrics & Gynecology

## 2017-11-01 VITALS — BP 126/80 | HR 70 | Resp 14 | Ht 66.0 in | Wt 154.0 lb

## 2017-11-01 DIAGNOSIS — Z01419 Encounter for gynecological examination (general) (routine) without abnormal findings: Secondary | ICD-10-CM

## 2017-11-01 MED ORDER — RALOXIFENE HCL 60 MG PO TABS: 60.0000 mg | ORAL_TABLET | Freq: Every day | ORAL | 4 refills | Status: DC

## 2017-11-01 MED ORDER — ESTRADIOL 2 MG VA RING: 2.0000 mg | VAGINAL_RING | VAGINAL | 4 refills | Status: DC

## 2017-11-01 NOTE — Progress Notes (Signed)
[name] here for annual exam.  Doing well.  Feels so much better after stopping hormonal therapy.  Seeing Dr. Dwyane Dee yearly due to hx of multinodular goiter.    Had two year follow up for pulmonary nodules.  No follow-up imaging recommended at this time.  Denies vagina bleeding.  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  Reviewed lab work through [facility].  Patient's last menstrual period was 01/01/2008.          Sexually active: Yes.    The current method of family planning is post menopausal status.    Exercising: Yes.    resistance, cardio Smoker:  Former smoker   Health Maintenance: Pap:  10/12/15 negative, HR HPV negative, 08/11/13  History of abnormal Pap:  No. MMG:  10/22/16 BIRADS 1 negative.   Colonoscopy:  03/19/12 normal- repeat 10 years  BMD:   08/21/16 at [place], osteopenia  TDaP:  06/2009 Pneumonia vaccine(s):  never Zostavax:   2015.  D/w pt shingrix vaccine.   Hep C testing: 11/24/13 negative  Screening Labs: PCp, Hb today: PCP   reports that she quit smoking about 23 years ago. Her smoking use included Cigarettes. She has a 40.00 pack-year smoking history. She has never used smokeless tobacco. She reports that she drinks about 12.0 oz of alcohol per week . She reports that she uses drugs.  Past Medical History:  Diagnosis Date  . Arthritis    osteoarthritis  . Cancer ([place])    melanoma- right upper arm  . History of frequent urinary tract infections    on prophalaytic medication   . Hypertension   . Osteomyelitis (H[facility])    left leg  . Osteopenia   . Renal cyst    benign, found on CT scan, is being followed  . Spinal stenosis    mild hx of herniated disc  . Thyroid disease     nodule, hypothyroidism pt was on synthyroid but is not having to take currently pt states her MD states she does not have issues with     Past Surgical History:  Procedure Laterality Date  . APPENDECTOMY  03/2011   laparoscopic  . CATARACT EXTRACTION,  BILATERAL  06/2015  . left tibia     for osteomelitis of left tibia at age 10  . repair of rt med meniscal tear    . SKIN CANCER EXCISION     right upper arm  . TOTAL KNEE ARTHROPLASTY Bilateral 03/09/2015   Procedure: BILATERAL TOTAL KNEE ARTHROPLASTY;  Surgeon: Gaynelle Arabian, MD;  Location: [facility];  Service: Orthopedics;  Laterality: Bilateral;    Current Outpatient Prescriptions  Medication Sig Dispense Refill  . Cholecalciferol (VITAMIN D3) 5000 units TABS Take 1 tablet by mouth daily.    Marland Kitchen estradiol (ESTRING) 2 MG vaginal ring Place 2 mg vaginally every 3 (three) months. 1 each 4  . losartan (COZAAR) 100 MG tablet TAKE 1 TABLET DAILY 30 tablet 0  . Multiple Vitamins-Minerals (CENTRUM ULTRA WOMENS PO) Take 1 tablet by mouth daily.    . raloxifene (EVISTA) 60 MG tablet Take 1 tablet (60 mg total) by mouth daily. 90 tablet 4  . BLACK COHOSH PO Take by mouth.     No current facility-administered medications for this visit.     Family History  Problem Relation Age of Onset  . Diabetes Mother        borderline type II  . Hypertension Mother   . Arthritis Mother   . Hypertension Father   .  Stroke Father   . COPD Father   . Hypertension Sister   . Osteoarthritis Sister   . Hypertension Brother   . Osteoarthritis Brother   . Hypertension Other        all grandparents  . Stroke Maternal Grandmother   . Osteoporosis Maternal Grandmother   . Stroke Paternal Grandfather   . Aneurysm Maternal Aunt 63  . Colon cancer Neg Hx   . Esophageal cancer Neg Hx   . Stomach cancer Neg Hx   . Rectal cancer Neg Hx   . Thyroid disease Neg Hx     ROS:  Pertinent items are noted in HPI.  Otherwise, a comprehensive ROS was negative.  Exam:   BP 126/80 (BP Location: Right Arm, Patient Position: Sitting, Cuff Size: Normal)   Pulse 70   Resp 14   Ht 5\' 6"  (1.676 m)   Wt 154 lb (69.9 kg)   LMP 01/01/2008   BMI 24.86 kg/m   Height: 5\' 6"  (167.6 cm)  Ht Readings from Last 3 Encounters:   11/01/17 5\' 6"  (1.676 m)  05/14/17 5\' 7"  (1.702 m)  04/29/17 5\' 7"  (1.702 m)    General appearance: alert, cooperative and appears stated age Head: Normocephalic, without obvious abnormality, atraumatic Neck: no adenopathy, supple, symmetrical, trachea midline and thyroid normal to inspection and palpation Lungs: clear to auscultation bilaterally Breasts: normal appearance, no masses or tenderness Heart: regular rate and rhythm Abdomen: soft, non-tender; bowel sounds normal; no masses,  no organomegaly Extremities: extremities normal, atraumatic, no cyanosis or edema Skin: Skin color, texture, turgor normal. No rashes or lesions Lymph nodes: Cervical, supraclavicular, and axillary nodes normal. No abnormal inguinal nodes palpated Neurologic: Grossly normal   Pelvic: External genitalia:  no lesions              Urethra:  normal appearing urethra with no masses, tenderness or lesions              Bartholins and Skenes: normal                 Vagina: normal appearing vagina with normal color and discharge, no lesions              Cervix: no lesions              Pap taken: No. Bimanual Exam:  Uterus:  normal size, contour, position, consistency, mobility, non-tender              Adnexa: normal adnexa and no mass, fullness, tenderness               Rectovaginal: Confirms               Anus:  normal sphincter tone, no lesions  Chaperone was present for exam.  A:  Well Woman with normal exam H/o recurrent UTIs.  Uses Estring. H/o osteopenia Multinodular goiter.  Followed by Dr. Dwyane Dee. Hypertension  P:   Mammogram guidelines reviewed. Pap smear not obtained today.  Pap and new HR HPV 2016. Plan BMD next year. Estring 2mg  every 3 months.  #1/4RF Continue Evista 60mg  daily.  #90/4RF. return annually or prn

## 2017-11-06 ENCOUNTER — Ambulatory Visit
Admission: RE | Admit: 2017-11-06 | Discharge: 2017-11-06 | Disposition: A | Discharge status: Home / Self Care | Payer: Managed Care, Other (non HMO) | Source: Ambulatory Visit | Attending: Obstetrics & Gynecology | Admitting: Obstetrics & Gynecology

## 2017-11-06 DIAGNOSIS — Z1231 Encounter for screening mammogram for malignant neoplasm of breast: Secondary | ICD-10-CM

## 2018-04-07 ENCOUNTER — Other Ambulatory Visit (INDEPENDENT_AMBULATORY_CARE_PROVIDER_SITE_OTHER): Payer: Managed Care, Other (non HMO)

## 2018-04-07 DIAGNOSIS — E042 Nontoxic multinodular goiter: Secondary | ICD-10-CM

## 2018-04-07 LAB — TSH: TSH: 0.83 u[IU]/mL (ref 0.35–4.50)

## 2018-04-07 LAB — T4, FREE: FREE T4: 0.7 ng/dL (ref 0.60–1.60)

## 2018-04-08 ENCOUNTER — Other Ambulatory Visit: Payer: Managed Care, Other (non HMO)

## 2018-04-11 ENCOUNTER — Encounter: Payer: Self-pay | Admitting: Endocrinology

## 2018-04-11 ENCOUNTER — Ambulatory Visit (INDEPENDENT_AMBULATORY_CARE_PROVIDER_SITE_OTHER): Payer: Managed Care, Other (non HMO) | Admitting: Endocrinology

## 2018-04-11 VITALS — BP 122/80 | HR 84 | Ht 66.0 in | Wt 152.8 lb

## 2018-04-11 DIAGNOSIS — E042 Nontoxic multinodular goiter: Secondary | ICD-10-CM | POA: Diagnosis not present

## 2018-04-11 NOTE — Progress Notes (Signed)
Patient ID: Tara Stanley, female   DOB: 1956-10-28, 62 y.o.   MRN: [id number]   Reason for Appointment:  Goiter, followup visit    History of Present Illness:   The goiter was first found in the year 2003 incidentally on a routine exam At that time she was in [place] and her endocrinologist evaluated her with serial ultrasounds She had multiple nodules with the largest one being 1.6 cm on the left side She was started on thyroid suppression Synthroid 50 mcg daily She did not feel any different with starting the thyroid supplement and her records do not indicate that she had hypothyroidism  Because of low TSH in 4/15 her Synthroid was stopped  Subsequently her TSH level has been normal consistently including in 03/2018  She herself has not on any swelling in her neck. Her thyroid nodule on exam measures about 2.5 cm consistently  No symptoms of local discomfort, difficulty swallowing, no choking sensation.  She is now here for her annual follow-up  Thyroid levels:  Lab Results  Component Value Date   TSH 0.83 04/07/2018   TSH 0.55 04/09/2017   TSH 0.53 04/10/2016   FREET4 0.70 04/07/2018   FREET4 1.08 04/09/2017   FREET4 1.12 04/10/2016      Allergies as of 04/11/2018      Reactions   Hydrocodone Other (See Comments)   Makes me goofy      Medication List        Accurate as of 04/11/18 10:15 AM. Always use your most recent med list.          BLACK COHOSH PO Take by mouth.   CENTRUM ULTRA WOMENS PO Take 1 tablet by mouth daily.   estradiol 2 MG vaginal ring Commonly known as:  ESTRING Place 2 mg vaginally every 3 (three) months.   losartan 100 MG tablet Commonly known as:  COZAAR TAKE 1 TABLET DAILY   raloxifene 60 MG tablet Commonly known as:  EVISTA Take 1 tablet (60 mg total) by mouth daily.   Vitamin D3 5000 units Tabs Take 1 tablet by mouth daily.       Allergies:  Allergies  Allergen Reactions  . Hydrocodone Other (See  Comments)    Makes me goofy    Past Medical History:  Diagnosis Date  . Arthritis    osteoarthritis  . Cancer ([place])    melanoma- right upper arm  . History of frequent urinary tract infections    on prophalaytic medication   . Hypertension   . Osteomyelitis ([facility]CC)    left leg  . Osteopenia   . Renal cyst    benign, found on CT scan, is being followed  . Spinal stenosis    mild hx of herniated disc  . Thyroid disease     nodule, hypothyroidism pt was on synthyroid but is not having to take currently pt states her MD states she does not have issues with     Past Surgical History:  Procedure Laterality Date  . APPENDECTOMY  03/2011   laparoscopic  . CATARACT EXTRACTION, BILATERAL  06/2015  . left tibia     for osteomelitis of left tibia at age 53  . repair of rt med meniscal tear    . SKIN CANCER EXCISION     right upper arm  . TOTAL KNEE ARTHROPLASTY Bilateral 03/09/2015   Procedure: BILATERAL TOTAL KNEE ARTHROPLASTY;  Surgeon: Gaynelle Arabian, MD;  Location: [facility];  Service: Orthopedics;  Laterality: Bilateral;  Family History  Problem Relation Age of Onset  . Diabetes Mother        borderline type II  . Hypertension Mother   . Arthritis Mother   . Hypertension Father   . Stroke Father   . COPD Father   . Hypertension Sister   . Osteoarthritis Sister   . Hypertension Brother   . Osteoarthritis Brother   . Hypertension Other        all grandparents  . Stroke Maternal Grandmother   . Osteoporosis Maternal Grandmother   . Stroke Paternal Grandfather   . Aneurysm Maternal Aunt 63  . Colon cancer Neg Hx   . Esophageal cancer Neg Hx   . Stomach cancer Neg Hx   . Rectal cancer Neg Hx   . Thyroid disease Neg Hx     Social History:  reports that she quit smoking about 24 years ago. Her smoking use included cigarettes. She has a 40.00 pack-year smoking history. She has never used smokeless tobacco. She reports that she drinks about 12.0 oz of alcohol per week. She  reports that she has current or past drug history.  REVIEW Of SYSTEMS:  She is on antihypertensives and is followed by PCP  She is followed by [facility] for wellness exam Currently taking Evista for mild osteopenia and history of stress fractures of the feet, bone density done by gynecologist  Hot flashes are relatively better compared to a few years ago   Examination:   BP 122/80 (BP Location: Left Arm, Patient Position: Sitting, Cuff Size: Normal)   Pulse 84   Ht 5\' 6"  (1.676 m)   Wt 152 lb 12.8 oz (69.3 kg)   LMP 01/01/2008   SpO2 94%   BMI 24.66 kg/m      Thyroid is palpable only on the left side, has a slightly firm fleshy nodule 2-2.5 cm   No lymphadenopathy in the neck    Assessment   History of multinodular goiter, long-standing since at least 2003 with largest nodule on the left side, previously 1.6 cm  Her left-sided nodule feels about the same in size on exam and is fairly smooth and slightly firm on exam  Right side not palpable   TSH is still low normal and unchanged    Plan: Continue to monitor the left-sided enlargement of the thyroid clinically annually We will also check her thyroid levels annually  Discussed benefits of Evista and she can continue this   Elayne Snare 04/11/2018, 10:15 AM

## 2018-04-15 ENCOUNTER — Telehealth: Payer: Self-pay | Admitting: Internal Medicine

## 2018-04-15 NOTE — Telephone Encounter (Signed)
Karis Rilling Self [phone]  Kora called to ask about her immunizations. Her daughter is having a baby soon and she wanted to be sure her parents were up to date on everything.  Jaelani's last Tdap was 7.14.2010

## 2018-04-15 NOTE — Telephone Encounter (Signed)
Left message on phone given that her Tdap is up to date from our standpoint but to check with OB

## 2018-04-28 ENCOUNTER — Telehealth: Payer: Self-pay | Admitting: Obstetrics & Gynecology

## 2018-04-28 NOTE — Telephone Encounter (Signed)
Does not need Tdap updated any sooner than 10 years.  Will be due by 07/14/2019.  Please let her know this.  It is out of flu season time now but would advise this in the fall.  Ok to proceed with Shingrix vaccination now and repeat 2-4 months.  Pharmacy is best place to get this.  Safe to have this and be around a baby as this is not a live virus vaccine.  Does not need order for this.  However, this is not necessary for being around a new baby--just an [place].  Thanks.

## 2018-04-28 NOTE — Telephone Encounter (Signed)
Patient wanting to find out if she is up to date on immunizations or what others she would need because daughter is pregnant.

## 2018-04-28 NOTE — Telephone Encounter (Signed)
Spoke with patient. Advised of message as seen below from Dr.Miller. Patient verbalizes understanding. Encounter closed. 

## 2018-04-28 NOTE — Telephone Encounter (Signed)
Patient received Tdap 07/13/2009. No documented flu shot.  Should patient receive Tdap early? Need to ensure patient has had a flu shot. Should she get the shingles vaccination as well?

## 2018-11-06 ENCOUNTER — Ambulatory Visit: Payer: Managed Care, Other (non HMO) | Admitting: Obstetrics & Gynecology

## 2018-11-06 ENCOUNTER — Ambulatory Visit (INDEPENDENT_AMBULATORY_CARE_PROVIDER_SITE_OTHER): Payer: Managed Care, Other (non HMO) | Admitting: Obstetrics & Gynecology

## 2018-11-06 ENCOUNTER — Encounter: Payer: Self-pay | Admitting: Obstetrics & Gynecology

## 2018-11-06 ENCOUNTER — Other Ambulatory Visit (HOSPITAL_COMMUNITY)
Admission: RE | Admit: 2018-11-06 | Discharge: 2018-11-06 | Disposition: A | Discharge status: Home / Self Care | Payer: Managed Care, Other (non HMO) | Source: Ambulatory Visit | Attending: Obstetrics & Gynecology | Admitting: Obstetrics & Gynecology

## 2018-11-06 ENCOUNTER — Other Ambulatory Visit: Payer: Self-pay

## 2018-11-06 VITALS — BP 122/80 | HR 96 | Resp 14 | Ht 66.0 in | Wt 152.0 lb

## 2018-11-06 DIAGNOSIS — Z01419 Encounter for gynecological examination (general) (routine) without abnormal findings: Secondary | ICD-10-CM

## 2018-11-06 DIAGNOSIS — Z124 Encounter for screening for malignant neoplasm of cervix: Secondary | ICD-10-CM | POA: Insufficient documentation

## 2018-11-06 MED ORDER — ESTRADIOL 2 MG VA RING: 2.0000 mg | VAGINAL_RING | VAGINAL | 4 refills | Status: DC

## 2018-11-06 MED ORDER — RALOXIFENE HCL 60 MG PO TABS: 60.0000 mg | ORAL_TABLET | Freq: Every day | ORAL | 4 refills | Status: DC

## 2018-11-06 NOTE — Progress Notes (Signed)
62 y.o. G2P2 [name] here for annual exam.  Doing well.  Denies vaginal bleeding.    Doing well with vaginal estrogen ring.  Cost is >$400/90 days.  Cannot figure out why this is not covered.  She is trying to get answered with insurance.  She has hx of recurrent UTIs.  Did see urology in the past.  Has been on prophylactic antibiotics.  Did try HRT but had significant PMP bleeding.    Patient's last menstrual period was 01/01/2008.          Sexually active: Yes.    The current method of family planning is post menopausal status.    Exercising: Yes.    resistance, cardio Smoker:  no  Health Maintenance: Pap:  10/12/15 neg. HR HPV:neg History of abnormal Pap:  no MMG:  11/06/17 BIRADS1:Neg  Colonoscopy:  03/19/12 Normal. F/u 10 years  BMD:  08/21/16 Osteopenia  TDaP:  2010 Pneumonia vaccine(s):  n/a Shingrix:  Is planning on getting it Hep C testing: 11/24/13  Screening Labs: PCP   reports that she quit smoking about 24 years ago. Her smoking use included cigarettes. She has a 40.00 pack-year smoking history. She has never used smokeless tobacco. She reports that she drinks about 21.0 standard drinks of alcohol per week. She reports that she has current or past drug history.  Past Medical History:  Diagnosis Date  . Arthritis    osteoarthritis  . Cancer ([place])    melanoma- right upper arm  . History of frequent urinary tract infections    on prophalaytic medication   . Hypertension   . Osteomyelitis ([facility])    left leg  . Osteopenia   . Renal cyst    benign, found on CT scan, is being followed  . Spinal stenosis    mild hx of herniated disc  . Thyroid disease     nodule, hypothyroidism pt was on synthyroid but is not having to take currently pt states her MD states she does not have issues with     Past Surgical History:  Procedure Laterality Date  . APPENDECTOMY  03/2011   laparoscopic  . CATARACT EXTRACTION, BILATERAL  06/2015  . left tibia      for osteomelitis of left tibia at age 89  . repair of rt med meniscal tear    . SKIN CANCER EXCISION     right upper arm  . TOTAL KNEE ARTHROPLASTY Bilateral 03/09/2015   Procedure: BILATERAL TOTAL KNEE ARTHROPLASTY;  Surgeon: Gaynelle Arabian, MD;  Location: [facility];  Service: Orthopedics;  Laterality: Bilateral;    Current Outpatient Medications  Medication Sig Dispense Refill  . BLACK COHOSH PO Take by mouth.    . Cholecalciferol (VITAMIN D3) 5000 units TABS Take 1 tablet by mouth daily.    Marland Kitchen estradiol (ESTRING) 2 MG vaginal ring Place 2 mg vaginally every 3 (three) months. 1 each 4  . losartan (COZAAR) 100 MG tablet TAKE 1 TABLET DAILY 30 tablet 0  . Multiple Vitamins-Minerals (CENTRUM ULTRA WOMENS PO) Take 1 tablet by mouth daily.    . raloxifene (EVISTA) 60 MG tablet Take 1 tablet (60 mg total) by mouth daily. 90 tablet 4   No current facility-administered medications for this visit.     Family History  Problem Relation Age of Onset  . Diabetes Mother        borderline type II  . Hypertension Mother   . Arthritis Mother   . Hypertension Father   .  Stroke Father   . COPD Father   . Hypertension Sister   . Osteoarthritis Sister   . Hypertension Brother   . Osteoarthritis Brother   . Hypertension Other        all grandparents  . Stroke Maternal Grandmother   . Osteoporosis Maternal Grandmother   . Stroke Paternal Grandfather   . Aneurysm Maternal Aunt 63  . Colon cancer Neg Hx   . Esophageal cancer Neg Hx   . Stomach cancer Neg Hx   . Rectal cancer Neg Hx   . Thyroid disease Neg Hx     Review of Systems  All other systems reviewed and are negative.   Exam:   BP 122/80 (BP Location: Right Arm, Patient Position: Sitting, Cuff Size: Large)   Pulse 96   Resp 14   Ht 5\' 6"  (1.676 m)   Wt 152 lb (68.9 kg)   LMP 01/01/2008   BMI 24.53 kg/m   Height: 5\' 6"  (167.6 cm)  Ht Readings from Last 3 Encounters:  11/06/18 5\' 6"  (1.676 m)  04/11/18 5\' 6"  (1.676 m)  11/01/17  5\' 6"  (1.676 m)    General appearance: alert, cooperative and appears stated age Head: Normocephalic, without obvious abnormality, atraumatic Neck: no adenopathy, supple, symmetrical, trachea midline and thyroid mildly enlarged, cannot feel any discrete nodules Lungs: clear to auscultation bilaterally Breasts: normal appearance, no masses or tenderness Heart: regular rate and rhythm Abdomen: soft, non-tender; bowel sounds normal; no masses,  no organomegaly Extremities: extremities normal, atraumatic, no cyanosis or edema Skin: Skin color, texture, turgor normal. No rashes or lesions Lymph nodes: Cervical, supraclavicular, and axillary nodes normal. No abnormal inguinal nodes palpated Neurologic: Grossly normal  Pelvic: External genitalia:  no lesions              Urethra:  normal appearing urethra with no masses, tenderness or lesions              Bartholins and Skenes: normal                 Vagina: normal appearing vagina with normal color and discharge, no lesions              Cervix: no lesions              Pap taken: Yes.   Bimanual Exam:  Uterus:  normal size, contour, position, consistency, mobility, non-tender              Adnexa: normal adnexa and no mass, fullness, tenderness               Rectovaginal: Confirms               Anus:  normal sphincter tone, no lesions  Chaperone was present for exam.  A:  Well Woman with normal exam PMP, no HRT H/O osteopenia Multinodular goiter.  Continues to be followed by Dr. Dwyane Dee. Hypertension  P:   Mammogram guidelines reviewed. pap smear with HR HPV obtained today Lab work done at [facility] Evista 60mg  daily.  #90/4RF Estring 2mg  rings every 3 months.  #1/4RF return annually or prn

## 2018-11-07 LAB — CYTOLOGY - PAP
Diagnosis: NEGATIVE
HPV: NOT DETECTED

## 2018-11-11 ENCOUNTER — Other Ambulatory Visit: Payer: Self-pay | Admitting: Obstetrics & Gynecology

## 2018-11-11 DIAGNOSIS — Z1231 Encounter for screening mammogram for malignant neoplasm of breast: Secondary | ICD-10-CM

## 2019-01-05 ENCOUNTER — Encounter: Payer: Self-pay | Admitting: Internal Medicine

## 2019-01-05 ENCOUNTER — Telehealth: Payer: Self-pay

## 2019-01-05 ENCOUNTER — Ambulatory Visit: Payer: Managed Care, Other (non HMO)

## 2019-01-05 ENCOUNTER — Ambulatory Visit: Payer: Managed Care, Other (non HMO) | Admitting: Internal Medicine

## 2019-01-05 VITALS — BP 120/90 | HR 78 | Temp 98.7°F | Ht 66.0 in | Wt 155.0 lb

## 2019-01-05 DIAGNOSIS — R059 Cough, unspecified: Secondary | ICD-10-CM

## 2019-01-05 DIAGNOSIS — R05 Cough: Secondary | ICD-10-CM

## 2019-01-05 DIAGNOSIS — J0100 Acute maxillary sinusitis, unspecified: Secondary | ICD-10-CM

## 2019-01-05 DIAGNOSIS — R509 Fever, unspecified: Secondary | ICD-10-CM

## 2019-01-05 LAB — POCT INFLUENZA A/B
INFLUENZA A, POC: NEGATIVE
INFLUENZA B, POC: NEGATIVE

## 2019-01-05 LAB — POCT RAPID STREP A (OFFICE): Rapid Strep A Screen: NEGATIVE

## 2019-01-05 MED ORDER — DOXYCYCLINE HYCLATE 100 MG PO TABS: 100.0000 mg | ORAL_TABLET | Freq: Two times a day (BID) | ORAL | 0 refills | Status: DC

## 2019-01-05 MED ORDER — BENZONATATE 100 MG PO CAPS: 100.0000 mg | ORAL_CAPSULE | Freq: Two times a day (BID) | ORAL | 0 refills | Status: DC | PRN

## 2019-01-05 NOTE — Telephone Encounter (Signed)
Patient called is been coughing for a while, has fatigue, nasal drainage she would like to know if you can work her in today? She can't come tomorrow bc she is going to visit her 65 year old mother in law.

## 2019-01-05 NOTE — Telephone Encounter (Signed)
Come at 4:30 and be worked in.

## 2019-01-30 NOTE — Patient Instructions (Signed)
Doxycycline 100 mg twice daily for 10 days.  Tessalon Perles 3 times a day as needed for cough.  Rest and drink plenty of fluids.

## 2019-01-30 NOTE — Progress Notes (Signed)
   Subjective:    Patient ID: Joelie Stanley, female    DOB: 05-02-1956, 63 y.o.   MRN: [id number]  HPI Patient now spending time between [place] in [place] since husband retired from [employer].  Has come down with fever headache and sinus congestion.  She has a history of hypertension.  This is been going on for several days.    Review of Systems see above     Objective:   Physical Exam She is afebrile.  Blood pressure 120/90.  Skin warm and dry.  Nodes none.  She sounds nasally congested.  TMs and pharynx are clear.  Neck supple.  Chest clear to auscultation without rales or wheezing       Assessment & Plan:  Acute maxillary sinusitis-rapid flu test is negative  Essential hypertension  Plan: Doxycycline 100 mg twice daily for 10 days.  Tessalon Perles 3 times a day as needed for cough.  Rest and drink plenty of fluids.

## 2019-02-03 ENCOUNTER — Ambulatory Visit
Admission: RE | Admit: 2019-02-03 | Discharge: 2019-02-03 | Disposition: A | Discharge status: Home / Self Care | Payer: Managed Care, Other (non HMO) | Source: Ambulatory Visit | Attending: Obstetrics & Gynecology | Admitting: Obstetrics & Gynecology

## 2019-02-03 DIAGNOSIS — Z1231 Encounter for screening mammogram for malignant neoplasm of breast: Secondary | ICD-10-CM

## 2019-04-06 ENCOUNTER — Other Ambulatory Visit: Payer: Managed Care, Other (non HMO)

## 2019-04-13 ENCOUNTER — Ambulatory Visit: Payer: Managed Care, Other (non HMO) | Admitting: Endocrinology

## 2019-04-22 ENCOUNTER — Ambulatory Visit (INDEPENDENT_AMBULATORY_CARE_PROVIDER_SITE_OTHER): Payer: Managed Care, Other (non HMO) | Admitting: Internal Medicine

## 2019-04-22 ENCOUNTER — Telehealth: Payer: Self-pay | Admitting: Internal Medicine

## 2019-04-22 DIAGNOSIS — M545 Low back pain, unspecified: Secondary | ICD-10-CM

## 2019-04-22 MED ORDER — LIDOCAINE 5 % EX PTCH: 1.0000 | MEDICATED_PATCH | CUTANEOUS | 1 refills | Status: DC

## 2019-04-22 NOTE — Telephone Encounter (Signed)
Tara Stanley [phone]  [name] called to say she hurt her lower back yesterday working in the yard. She wanted to see if she could get prescription for Lidoderm patches. (She had some out of date ones and they did not help) I advised that we would probably need to do a virtual visit so you could talk with her about what's going on with her back.

## 2019-04-22 NOTE — Telephone Encounter (Signed)
Virtual Visit scheduled °

## 2019-04-22 NOTE — Telephone Encounter (Signed)
Please set up virtual visit

## 2019-04-22 NOTE — Progress Notes (Signed)
   Subjective:    Patient ID: Tara Stanley, female    DOB: [birth year], 63 y.o.   MRN: [id number]  HPI 63 year old Female has been doing lots of yard work with the Coronavirus pandemic stay-at-home restrictions.  She does exercise regularly.  Complaining of low back pain.  Requesting lidocaine patches which have worked well for her in the past.  She is seen today by [employer] due to the Coronavirus pandemic.  She agrees to a visit in this format today.  She is identified as Tara Stanley. Tara Stanley using 2 identifiers as a patient in this practice.    Review of Systems denies radiculopathy or weakness in the lower extremities     Objective:   Physical Exam She says she has normal strength in her lower extremities just decreased range of motion with bending due to low back pain       Assessment & Plan:  Acute low back pain  Plan: Lidocaine patches will be prescribed for her as requested.  Apply 1 patch to skin daily as needed for low back pain.  Call if symptoms not improving in 7 to 10 days or sooner if worse.

## 2019-04-28 ENCOUNTER — Other Ambulatory Visit: Payer: Managed Care, Other (non HMO)

## 2019-04-29 ENCOUNTER — Encounter: Payer: Self-pay | Admitting: Internal Medicine

## 2019-04-29 NOTE — Patient Instructions (Addendum)
Lidocaine patches to be applied to back daily until back pain has resolved.  Call if not improving in 7 to 10 days or sooner if worse.

## 2019-05-26 ENCOUNTER — Other Ambulatory Visit: Payer: Managed Care, Other (non HMO)

## 2019-05-29 ENCOUNTER — Ambulatory Visit: Payer: Managed Care, Other (non HMO) | Admitting: Endocrinology

## 2019-10-10 ENCOUNTER — Other Ambulatory Visit: Payer: Self-pay | Admitting: Endocrinology

## 2019-10-10 DIAGNOSIS — E042 Nontoxic multinodular goiter: Secondary | ICD-10-CM

## 2019-10-12 ENCOUNTER — Other Ambulatory Visit: Payer: Managed Care, Other (non HMO)

## 2019-10-15 ENCOUNTER — Ambulatory Visit: Payer: Managed Care, Other (non HMO) | Admitting: Endocrinology

## 2019-10-18 ENCOUNTER — Encounter: Payer: Self-pay | Admitting: Obstetrics & Gynecology

## 2019-10-19 ENCOUNTER — Ambulatory Visit (INDEPENDENT_AMBULATORY_CARE_PROVIDER_SITE_OTHER): Payer: Managed Care, Other (non HMO) | Admitting: Obstetrics & Gynecology

## 2019-10-19 ENCOUNTER — Telehealth: Payer: Self-pay | Admitting: *Deleted

## 2019-10-19 ENCOUNTER — Other Ambulatory Visit: Payer: Self-pay | Admitting: Obstetrics & Gynecology

## 2019-10-19 ENCOUNTER — Other Ambulatory Visit: Payer: Self-pay

## 2019-10-19 ENCOUNTER — Encounter: Payer: Self-pay | Admitting: Obstetrics & Gynecology

## 2019-10-19 VITALS — BP 146/86 | HR 84 | Temp 97.2°F | Ht 66.0 in | Wt 146.5 lb

## 2019-10-19 DIAGNOSIS — N950 Postmenopausal bleeding: Secondary | ICD-10-CM | POA: Diagnosis not present

## 2019-10-19 NOTE — Progress Notes (Signed)
GYNECOLOGY  VISIT  CC:   Vaginal bleeding since yesterday   HPI: 63 y.o. G2P2 [name] here for PMB that started yesterday.  The bleeding was bright red but light.  She was due to change the estring which she removed and replaced.  She has noticed light bleeding throughout the last two days.  She is having some increased sciatica and some very mild low pelvic cramping.  Denies any breast tenderness or acne.    Last Pap was 11/19 and the HR HPV was negative.    GYNECOLOGIC HISTORY: Patient's last menstrual period was 01/01/2008. Contraception: PMP Menopausal hormone therapy: none  Patient Active Problem List   Diagnosis Date Noted  . Status post bilateral knee replacements 03/14/2015  . OA (osteoarthritis) of knee 03/09/2015  . Hypertension 02/17/2012  . Multinodular goiter 02/17/2012  . Spinal stenosis of lumbar region 02/17/2012  . 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 Menopause 08/18/2010  . BP (high blood pressure) 08/19/2007  . Malignant melanoma ([place]) 08/19/2007  . Adult hypothyroidism 08/19/2003  . Arthritis of knee, degenerative 11/25/1999    Past Medical History:  Diagnosis Date  . Arthritis    osteoarthritis  . History of frequent urinary tract infections    on prophalaytic medication   . Hypertension   . Melanoma ([place])    right upper arm  . Osteomyelitis ([place]) 1967   left leg  . Osteopenia   . Renal cyst    benign, found on CT scan, is being followed  . Spinal stenosis    mild hx of herniated disc  . Thyroid nodule     nodule, hypothyroidism pt was on synthyroid but is not having to take currently pt states her MD states she does not have issues with     Past Surgical History:  Procedure Laterality Date  . APPENDECTOMY  03/2011   laparoscopic  . CATARACT EXTRACTION, BILATERAL  06/2015  . left tibia Left 1967   exploratory surgery and treatment of osteomyelitis  . SKIN CANCER EXCISION     right  upper arm  . TOTAL KNEE ARTHROPLASTY Bilateral 03/09/2015   Procedure: BILATERAL TOTAL KNEE ARTHROPLASTY;  Surgeon: Gaynelle Arabian, MD;  Location: [facility];  Service: Orthopedics;  Laterality: Bilateral;    MEDS:   Current Outpatient Medications on File Prior to Visit  Medication Sig Dispense Refill  . BLACK COHOSH PO Take by mouth.    . Cholecalciferol (VITAMIN D3) 5000 units TABS Take 1 tablet by mouth daily.    Marland Kitchen estradiol (ESTRING) 2 MG vaginal ring Place 2 mg vaginally every 3 (three) months. 1 each 4  . losartan (COZAAR) 100 MG tablet TAKE 1 TABLET DAILY 30 tablet 0  . Multiple Vitamins-Minerals (CENTRUM ULTRA WOMENS PO) Take 1 tablet by mouth daily.    . raloxifene (EVISTA) 60 MG tablet Take 1 tablet (60 mg total) by mouth daily. 90 tablet 4   No current facility-administered medications on file prior to visit.     ALLERGIES: Hydrocodone  Family History  Problem Relation Age of Onset  . Diabetes Mother        borderline type II  . Hypertension Mother   . Arthritis Mother   . Hypertension Father   . Stroke Father   . COPD Father   . Hypertension Sister   . Osteoarthritis Sister   . Hypertension Brother   . Osteoarthritis Brother   . Hypertension Other  all grandparents  . Stroke Maternal Grandmother   . Osteoporosis Maternal Grandmother   . Stroke Paternal Grandfather   . Aneurysm Maternal Aunt 63  . Colon cancer Neg Hx   . Esophageal cancer Neg Hx   . Stomach cancer Neg Hx   . Rectal cancer Neg Hx   . Thyroid disease Neg Hx     SH:  Married, non smoker  Review of Systems  Genitourinary: Positive for vaginal bleeding.  All other systems reviewed and are negative.   PHYSICAL EXAMINATION:    BP (!) 146/86   Pulse 84   Temp (!) 97.2 F (36.2 C) (Temporal)   Ht 5\' 6"  (1.676 m)   Wt 146 lb 8 oz (66.5 kg)   LMP 01/01/2008   BMI 23.65 kg/m     General appearance: alert, cooperative and appears stated age Abdomen: soft, non-tender; bowel sounds normal;  no masses,  no organomegaly Lymph:  no inguinal LAD noted  Pelvic: External genitalia:  no lesions              Urethra:  normal appearing urethra with no masses, tenderness or lesions              Bartholins and Skenes: normal                 Vagina: normal appearing vagina with normal color and discharge, no lesions              Cervix: no lesions              Bimanual Exam:  Uterus:  normal size, contour, position, consistency, mobility, non-tender              Adnexa: no mass, fullness, tenderness  Endometrial biopsy recommended.  Discussed with patient.  Verbal and written consent obtained.   Procedure:  Speculum placed.  Cervix visualized and cleansed with betadine prep.  A single toothed tenaculum was applied to the anterior lip of the cervix.  Endometrial pipelle was advanced through the cervix into the endometrial cavity without difficulty.  Pipelle passed to 7cm.  Suction applied and pipelle removed with scant tissue sample obtained.  Second pass performed.  Tenculum removed.  No bleeding noted.  Patient tolerated procedure well.  Chaperone was present for exam.  Assessment: PMP bleeding Vaginal atrophic changes and Estring use  Plan: Biopsy results will be called to pt.  If negative, will proceed with PUS.  Pt has clear understanding of plan and possible causes of PMP bleeding.

## 2019-10-19 NOTE — Telephone Encounter (Signed)
Call placed to patient in f/u to 10/18/19 MyChart message. Patient needs OV for PMB per Dr. Sabra Heck.   Last AEX 11/06/18 Next AEX 11/10/19   Covid19 prescreen negative, precautions reviewed. OV scheduled for today at 4:15pm with Dr. Sabra Heck. Patient aware she will be worked into schedule. Patient will keep AEX as scheduled for 11/10/19.   Routing to provider for final review. Patient is agreeable to disposition. Will close encounter.

## 2019-10-23 ENCOUNTER — Telehealth: Payer: Self-pay | Admitting: *Deleted

## 2019-10-23 ENCOUNTER — Other Ambulatory Visit: Payer: Self-pay | Admitting: *Deleted

## 2019-10-23 DIAGNOSIS — N950 Postmenopausal bleeding: Secondary | ICD-10-CM

## 2019-10-23 NOTE — Telephone Encounter (Signed)
-----   Message from [name], MD sent at 10/23/2019  1:06 PM EDT ----- Please let pt know her endometrial biopsy showed benign tissue.  We discussed a PUS if this was negative due to PMP bleeding.  Ok to proceed with scheduling.  Thanks.  CC:  Lowell Bouton, CMA

## 2019-10-23 NOTE — Telephone Encounter (Signed)
Notes recorded by Burnice Logan, RN on 10/23/2019 at 4:04 PM EDT  Call to patient, left detailed message on mobile number, name identified on voicemail, ok per dpr. Advised as seen below per Dr. Sabra Heck. Advised patient to return call to office to schedule PUS.   Order placed for PUS.

## 2019-10-26 NOTE — Telephone Encounter (Signed)
Patient returning call to schedule PUS.

## 2019-10-26 NOTE — Telephone Encounter (Signed)
Spoke with patient. PUS scheduled for 11/5 at 2:30pm, consult at 3pm with Dr. Sabra Heck. Patient declined earlier appts offered. Patient verbalizes understanding and is agreeable.   Routing to [employer] and [employer]  for [employer].   Encounter closed.

## 2019-11-03 ENCOUNTER — Other Ambulatory Visit: Payer: Self-pay

## 2019-11-05 ENCOUNTER — Encounter: Payer: Self-pay | Admitting: Obstetrics & Gynecology

## 2019-11-05 ENCOUNTER — Ambulatory Visit (INDEPENDENT_AMBULATORY_CARE_PROVIDER_SITE_OTHER): Payer: Managed Care, Other (non HMO)

## 2019-11-05 ENCOUNTER — Ambulatory Visit: Payer: Managed Care, Other (non HMO) | Admitting: Obstetrics & Gynecology

## 2019-11-05 ENCOUNTER — Other Ambulatory Visit: Payer: Self-pay

## 2019-11-05 VITALS — BP 150/78 | HR 88 | Temp 97.5°F | Resp 12 | Ht 66.0 in | Wt 147.2 lb

## 2019-11-05 DIAGNOSIS — N950 Postmenopausal bleeding: Secondary | ICD-10-CM | POA: Diagnosis not present

## 2019-11-05 NOTE — Progress Notes (Signed)
63 y.o. G2P2 [name] here for pelvic ultrasound due to PMP bleeding.  She had an endometrial biopsy on 10/19/2019 showing: Endometrium, biopsy - VERY LOW CELLULARITY SPECIMEN. RARE MINUTE FRAGMENTS OF BENIGN ATROPHIC ENDOMETRIAL GLANDULAR EPITHELIUM PRESENT.  Because of low cellularity, ultrasound recommended.  Pt here for this today.  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  I can review some of the records.  BMD is not in CareEverywhere at this point.  Pt feels like she has a copy of this at home and it was stable.    She does not really feel she needs AEX next week.  Pap was negative 2019 and I've already done pelvic exam, I feel it's ok to change appt to next year.   Vaginal bleeding has stopped  Patient's last menstrual period was 01/01/2008.  Contraception: PMP  Findings:  UTERUS: 4.8 x 4.1 x 3.0 cm EMS: 4.0 mm ADNEXA: Left ovary: 1.7 x 0.6 x 0.8 cm       Right ovary: 1.1 x 0.9 x 0.6cm CUL DE SAC: No free fluid  Discussion: Findings reviewed with patient.  As endometrium measured at 4 mm and biopsy had scant cellularity, I would recommend a hysteroscopy with D&C if she has future bleeding.  Patient voiced clear understanding of this.  Questions answered.  Assessment: Postmenopausal bleeding with scant cellularity but benign tissue and endometrial biopsy and 4 mm endometrium on ultrasound today.  Plan: Return in 1 year for annual exam Call with any future bleeding.  If it is anytime the next few months, I would proceed with hysteroscopy.  ~15 minutes spent with patient >50% of time was in face to face discussion of above.

## 2019-11-10 ENCOUNTER — Ambulatory Visit: Payer: Managed Care, Other (non HMO) | Admitting: Obstetrics & Gynecology

## 2019-12-26 ENCOUNTER — Other Ambulatory Visit: Payer: Self-pay | Admitting: Obstetrics & Gynecology

## 2019-12-28 NOTE — Telephone Encounter (Signed)
Med refill request:Estring  Last AEX: 11/06/2018 Next AEX: 11/08/2020 Last MMG (if hormonal med) 02/03/19, BIRADS 1, Negative Refill authorized: Please Advise? #1, 4 RF, orders pended if approved.

## 2020-01-13 ENCOUNTER — Other Ambulatory Visit: Payer: Self-pay | Admitting: Obstetrics & Gynecology

## 2020-01-13 NOTE — Telephone Encounter (Signed)
Medication refill request: Evista Last AEX:  11-06-18 SM Next AEX: 11-08-20 Last MMG (if hormonal medication request): n/a Refill authorized: Today, please advise.   Medication pended for #90, 0RF. Please refill if appropriate.

## 2020-01-28 ENCOUNTER — Other Ambulatory Visit: Payer: Self-pay | Admitting: Obstetrics & Gynecology

## 2020-01-28 DIAGNOSIS — Z1231 Encounter for screening mammogram for malignant neoplasm of breast: Secondary | ICD-10-CM

## 2020-02-29 ENCOUNTER — Other Ambulatory Visit: Payer: Self-pay

## 2020-02-29 ENCOUNTER — Ambulatory Visit
Admission: RE | Admit: 2020-02-29 | Discharge: 2020-02-29 | Disposition: A | Discharge status: Home / Self Care | Payer: Managed Care, Other (non HMO) | Source: Ambulatory Visit | Attending: Obstetrics & Gynecology | Admitting: Obstetrics & Gynecology

## 2020-02-29 DIAGNOSIS — Z1231 Encounter for screening mammogram for malignant neoplasm of breast: Secondary | ICD-10-CM

## 2020-03-15 DIAGNOSIS — M353 Polymyalgia rheumatica: Secondary | ICD-10-CM | POA: Insufficient documentation

## 2020-11-08 ENCOUNTER — Ambulatory Visit: Payer: Managed Care, Other (non HMO) | Admitting: Obstetrics & Gynecology

## 2021-02-08 ENCOUNTER — Other Ambulatory Visit: Payer: Self-pay | Admitting: Urology

## 2021-02-08 ENCOUNTER — Other Ambulatory Visit: Payer: Self-pay | Admitting: Obstetrics & Gynecology

## 2021-02-08 DIAGNOSIS — D1771 Benign lipomatous neoplasm of kidney: Secondary | ICD-10-CM

## 2021-02-08 DIAGNOSIS — Z1231 Encounter for screening mammogram for malignant neoplasm of breast: Secondary | ICD-10-CM

## 2021-02-22 ENCOUNTER — Ambulatory Visit
Admission: RE | Admit: 2021-02-22 | Discharge: 2021-02-22 | Disposition: A | Discharge status: Home / Self Care | Payer: Managed Care, Other (non HMO) | Source: Ambulatory Visit | Attending: Urology | Admitting: Urology

## 2021-02-22 DIAGNOSIS — D1771 Benign lipomatous neoplasm of kidney: Secondary | ICD-10-CM

## 2021-02-24 ENCOUNTER — Encounter: Payer: Self-pay | Admitting: Internal Medicine

## 2021-02-24 ENCOUNTER — Ambulatory Visit (INDEPENDENT_AMBULATORY_CARE_PROVIDER_SITE_OTHER): Payer: Managed Care, Other (non HMO) | Admitting: Internal Medicine

## 2021-02-24 ENCOUNTER — Other Ambulatory Visit: Payer: Self-pay

## 2021-02-24 VITALS — BP 130/88 | HR 72 | Ht 65.5 in | Wt 152.0 lb

## 2021-02-24 DIAGNOSIS — J069 Acute upper respiratory infection, unspecified: Secondary | ICD-10-CM

## 2021-02-24 DIAGNOSIS — R0989 Other specified symptoms and signs involving the circulatory and respiratory systems: Secondary | ICD-10-CM | POA: Diagnosis not present

## 2021-02-24 DIAGNOSIS — M353 Polymyalgia rheumatica: Secondary | ICD-10-CM

## 2021-02-24 MED ORDER — AZITHROMYCIN 250 MG PO TABS: ORAL_TABLET | ORAL | 0 refills | Status: DC

## 2021-02-25 LAB — C-REACTIVE PROTEIN: CRP: 0.3 mg/L (ref <8.0)

## 2021-02-25 LAB — SARS-COV-2 RNA,(COVID-19) QUALITATIVE NAAT: SARS CoV2 RNA: NOT DETECTED

## 2021-02-25 LAB — SEDIMENTATION RATE: Sed Rate: 2 mm/h (ref 0–30)

## 2021-02-25 NOTE — Patient Instructions (Signed)
It was good to see you today.  Labs drawn and will be faxed to [facility].  Take Zithromax Z-PAK 2 tabs day 1 followed by 1 tab days 2 through 5.  Get COVID booster after you have recovered from this illness.

## 2021-02-25 NOTE — Progress Notes (Addendum)
   Subjective:    Patient ID: Tara Stanley, female    DOB: Jul 20, 1956, 65 y.o.   MRN: [id number]  HPI 65 year old Female has been diagnosed with PMR by [facility] in [place].Labs have been requested by that office, were drawn today, and will be faxed when resulted including sed rate and C-reactive protein. ESR in March 2021 was 47 and was started on Prednisone 15 mg daily for a month decreasing by 5 mg monthly. Since October  2021, decreased Prednisone by 1 mg every 6 weeks. Seen by Rheumatologist this month and currently on prednisone 3 mg daily. Has some morning stiffness. Is on Evista to help with bone loss due to steroid therapy.  Has developed sinusitis symptoms. Has had 3 Covid-19 vaccines.  Has been around grandchildren. Covid PCR test obtained today. She has maxillary sinus pressure. No fever, cough or chills. Some post nasal drainage.  Had high-dose flu vaccine in November 2021.  History of HTN treated with losartan  Review of Systems see above     Objective:   Physical Exam BP 130/88 Pulse 72 and regular. TMs clear. Pharynx slightly injected. Neck supple. Chest is clear without rales or wheezing.      Assessment & Plan:  Acute upper respiratory infection-treat with Zithromax Z-PAK 2 tablets day 1 followed by 1 tablet days 2 through 5.  Polymyalgia rheumatica currently on prednisone 5 mg daily and followed by [facility].  Labs drawn as requested and will be faxed to that office.  Essential hypertension-stable on losartan.  Would like to see diastolic less than 80 on a consistent basis.  She will check on her blood pressure.  Plan: Zithromax Z-PAK 2 tabs day 1 followed by 1 tab days 2 through 5.  Sed rate is 2 and C-reactive protein is normal at 0.3.  These results will be faxed to [facility] rheumatologist as requested.  Her COVID-19 test has been obtained and is pending.  She has had 2 Moderna vaccines.  She should get booster.

## 2021-02-27 ENCOUNTER — Other Ambulatory Visit: Payer: Self-pay

## 2021-03-10 ENCOUNTER — Other Ambulatory Visit (HOSPITAL_COMMUNITY)
Admission: RE | Admit: 2021-03-10 | Discharge: 2021-03-10 | Disposition: A | Discharge status: Home / Self Care | Payer: Managed Care, Other (non HMO) | Source: Ambulatory Visit | Attending: Obstetrics & Gynecology | Admitting: Obstetrics & Gynecology

## 2021-03-10 ENCOUNTER — Encounter (HOSPITAL_BASED_OUTPATIENT_CLINIC_OR_DEPARTMENT_OTHER): Payer: Self-pay | Admitting: Obstetrics & Gynecology

## 2021-03-10 ENCOUNTER — Ambulatory Visit (INDEPENDENT_AMBULATORY_CARE_PROVIDER_SITE_OTHER): Payer: Managed Care, Other (non HMO) | Admitting: Obstetrics & Gynecology

## 2021-03-10 ENCOUNTER — Other Ambulatory Visit: Payer: Self-pay

## 2021-03-10 VITALS — BP 142/90 | Ht 66.0 in | Wt 154.5 lb

## 2021-03-10 DIAGNOSIS — M353 Polymyalgia rheumatica: Secondary | ICD-10-CM

## 2021-03-10 DIAGNOSIS — Z01419 Encounter for gynecological examination (general) (routine) without abnormal findings: Secondary | ICD-10-CM | POA: Diagnosis not present

## 2021-03-10 DIAGNOSIS — M8580 Other specified disorders of bone density and structure, unspecified site: Secondary | ICD-10-CM

## 2021-03-10 DIAGNOSIS — Z124 Encounter for screening for malignant neoplasm of cervix: Secondary | ICD-10-CM

## 2021-03-10 DIAGNOSIS — Z780 Asymptomatic menopausal state: Secondary | ICD-10-CM

## 2021-03-10 MED ORDER — ESTRING 2 MG VA RING: VAGINAL_RING | VAGINAL | 4 refills | Status: DC

## 2021-03-10 NOTE — Progress Notes (Signed)
65 y.o. G2P2 [name] here for annual exam.  Doing well.  Denies vaginal bleeding.  Mother in law passed from [place].  They have two new grandchildren.  This has been a wonderful joy.    Denies vaginal bleeding.  Patient's last menstrual period was 01/01/2008.          Sexually active: Yes.    The current method of family planning is post menopausal status.    Exercising: Yes.    Smoker:  no  Health Maintenance: Pap:  10/2018, neg HR HPV History of abnormal Pap:  no MMG:  Scheduled 03/30/2021 Colonoscopy:  02/2012 BMD:   11/2019 TDaP:  06/2018 Pneumonia vaccine(s):  10/2020 Shingrix:   started Hep C testing: 10/2013 Screening Labs: done at [facility]   reports that she quit smoking about 27 years ago. Her smoking use included cigarettes. She has a 40.00 pack-year smoking history. She has never used smokeless tobacco. She reports current alcohol use of about 21.0 standard drinks of alcohol per week. She reports current drug use.  Past Medical History:  Diagnosis Date  . Arthritis    osteoarthritis  . History of frequent urinary tract infections    on prophalaytic medication   . Hypertension   . Melanoma ([place])    right upper arm  . Osteomyelitis ([place]) 1967   left leg  . Osteopenia   . Renal cyst    benign, found on CT scan, is being followed  . Spinal stenosis    mild hx of herniated disc  . Thyroid nodule     nodule, hypothyroidism pt was on synthyroid but is not having to take currently pt states her MD states she does not have issues with     Past Surgical History:  Procedure Laterality Date  . APPENDECTOMY  03/2011   laparoscopic  . CATARACT EXTRACTION, BILATERAL  06/2015  . left tibia Left 1967   exploratory surgery and treatment of osteomyelitis  . SKIN CANCER EXCISION     right upper arm  . TOTAL KNEE ARTHROPLASTY Bilateral 03/09/2015   Procedure: BILATERAL TOTAL KNEE ARTHROPLASTY;  Surgeon: Gaynelle Arabian, MD;  Location: [facility];  Service:  Orthopedics;  Laterality: Bilateral;    Current Outpatient Medications  Medication Sig Dispense Refill  . Cholecalciferol (VITAMIN D3) 5000 units TABS Take 1 tablet by mouth daily.    Marland Kitchen ESTRING 2 MG vaginal ring INSERT 1 RING VAGINALLY EVERY 3 MONTHS 1 each 4  . losartan (COZAAR) 100 MG tablet TAKE 1 TABLET DAILY 30 tablet 0  . Multiple Vitamins-Minerals (CENTRUM ULTRA WOMENS PO) Take 1 tablet by mouth daily.    . predniSONE (DELTASONE) 1 MG tablet Take 3 mg by mouth daily.    . raloxifene (EVISTA) 60 MG tablet TAKE 1 TABLET DAILY 90 tablet 4  . azithromycin (ZITHROMAX) 250 MG tablet 2 tabs by mouth day 1 followed by one tab by mouth days 2-5 6 tablet 0   No current facility-administered medications for this visit.    Family History  Problem Relation Age of Onset  . Diabetes Mother        borderline type II  . Hypertension Mother   . Arthritis Mother   . Hypertension Father   . Stroke Father   . COPD Father   . Hypertension Sister   . Osteoarthritis Sister   . Hypertension Brother   . Osteoarthritis Brother   . Hypertension Other        all grandparents  .  Stroke Maternal Grandmother   . Osteoporosis Maternal Grandmother   . Stroke Paternal Grandfather   . Aneurysm Maternal Aunt 63  . Colon cancer Neg Hx   . Esophageal cancer Neg Hx   . Stomach cancer Neg Hx   . Rectal cancer Neg Hx   . Thyroid disease Neg Hx     Review of Systems  All other systems reviewed and are negative.   Exam:   BP (!) 142/90   Ht 5\' 6"  (1.676 m)   Wt 154 lb 8 oz (70.1 kg)   LMP 01/01/2008   BMI 24.94 kg/m   Height: 5\' 6"  (167.6 cm)  General appearance: alert, cooperative and appears stated age Head: Normocephalic, without obvious abnormality, atraumatic Neck: no adenopathy, supple, symmetrical, trachea midline and thyroid normal to inspection and palpation Lungs: clear to auscultation bilaterally Breasts: normal appearance, no masses or tenderness Heart: regular rate and  rhythm Abdomen: soft, non-tender; bowel sounds normal; no masses,  no organomegaly Extremities: extremities normal, atraumatic, no cyanosis or edema Skin: Skin color, texture, turgor normal. No rashes or lesions Lymph nodes: Cervical, supraclavicular, and axillary nodes normal. No abnormal inguinal nodes palpated Neurologic: Grossly normal   Pelvic: External genitalia:  no lesions              Urethra:  normal appearing urethra with no masses, tenderness or lesions              Bartholins and Skenes: normal                 Vagina: normal appearing vagina with normal color and discharge, no lesions              Cervix: no lesions              Pap taken: Yes.   Bimanual Exam:  Uterus:  normal size, contour, position, consistency, mobility, non-tender              Adnexa: normal adnexa and no mass, fullness, tenderness               Rectovaginal: Confirms               Anus:  normal sphincter tone, no lesions  Chaperone, Mickey Farber, CMA, was present for exam.  Assessment/Plan: 1. Well woman exam with routine gynecological exam - pap obtained today - MMG scheduled - colonoscopy due next year - BMD in 1-2 years - blood work done at [facility] - vaccines reviewed  2. Polymyalgia rheumatica (HCC) - Followed by rheumatology at [facility]  3. Osteopenia, unspecified location - plan BMD in another 1-2 years - on Evista  4. Postmenopausal - Rx for Estring 2mg  pv

## 2021-03-13 LAB — CYTOLOGY - PAP: Diagnosis: NEGATIVE

## 2021-03-30 ENCOUNTER — Ambulatory Visit: Payer: Managed Care, Other (non HMO)

## 2021-05-03 ENCOUNTER — Other Ambulatory Visit: Payer: Self-pay

## 2021-05-03 ENCOUNTER — Ambulatory Visit
Admission: RE | Admit: 2021-05-03 | Discharge: 2021-05-03 | Disposition: A | Discharge status: Home / Self Care | Payer: Managed Care, Other (non HMO) | Source: Ambulatory Visit | Attending: Obstetrics & Gynecology | Admitting: Obstetrics & Gynecology

## 2021-05-03 DIAGNOSIS — Z1231 Encounter for screening mammogram for malignant neoplasm of breast: Secondary | ICD-10-CM

## 2021-05-08 ENCOUNTER — Ambulatory Visit: Payer: Managed Care, Other (non HMO) | Admitting: Internal Medicine

## 2021-05-08 ENCOUNTER — Telehealth: Payer: Self-pay | Admitting: Internal Medicine

## 2021-05-08 ENCOUNTER — Encounter: Payer: Self-pay | Admitting: Internal Medicine

## 2021-05-08 ENCOUNTER — Other Ambulatory Visit: Payer: Self-pay

## 2021-05-08 VITALS — HR 104 | Temp 99.9°F

## 2021-05-08 DIAGNOSIS — U071 COVID-19: Secondary | ICD-10-CM

## 2021-05-08 DIAGNOSIS — R059 Cough, unspecified: Secondary | ICD-10-CM

## 2021-05-08 MED ORDER — AZITHROMYCIN 250 MG PO TABS: ORAL_TABLET | ORAL | 0 refills | Status: AC

## 2021-05-08 MED ORDER — BENZONATATE 100 MG PO CAPS: 100.0000 mg | ORAL_CAPSULE | Freq: Three times a day (TID) | ORAL | 0 refills | Status: DC | PRN

## 2021-05-08 NOTE — Progress Notes (Signed)
   Subjective:    Patient ID: Tara Stanley, female    DOB: [birth year], 65 y.o.   MRN: [id number]  HPI   Pleasant 65 year old Female with history of Polymyalgia rheumatica currently on Prednisone 2 mg daily per [facility] and this condition has improved over the past year. History of hypertension. No recent travel. Immunized x 4 for Covid-19. Says she is careful and wears a mask.  Her general health is good.  She has a history of hypertension.  She called late morning today saying that she had tested positive on Sunday for COVID-19 with a home test.  She complained of bad cough, intermittent fever up to 100.7 degrees and myalgias.  She did get COVID booster about 1-1/2 weeks ago.  Has been taking Sudafed and Advil.  Office visit was advised at 3:45 PM today.   Review of Systems no nausea vomiting or dysgeusia.  Reports malaise and fatigue.     Objective:   Physical Exam Temperature 99.9 degrees pulse 104 pulse oximetry 97%.  Pharynx is slightly injected.  TMs clear chest is clear to auscultation without rales or wheezing.  She looks fatigued  COVID-19 PCR test obtained and result proved to be positive     Assessment & Plan:  COVID-19 virus infection  Plan: Patient request something for cough.  We sent in Tessalon Perles 100 mg 1 capsule up to 3 times daily as needed for cough.  Rest and drink plenty of fluids.  She is on a very low-dose of prednisone.  She has no evidence of pneumonia.  She will call if symptoms worsen.

## 2021-05-08 NOTE — Telephone Encounter (Signed)
Tara Stanley [phone]  Dariona called to say she has tested positive on Sunday for COVID with home test. She has bad cough, running fever off and on of 100.7 feels achy. She did get 2nd booster about 1 1/2 ago. Has been taking Sudafed and Advil.

## 2021-05-08 NOTE — Telephone Encounter (Signed)
Car visit 3:45  pm today. Need a copy of her Covid vaccines when she comes.

## 2021-05-08 NOTE — Telephone Encounter (Signed)
Scheduled at 3:45pm today.

## 2021-05-09 LAB — SARS-COV-2 RNA,(COVID-19) QUALITATIVE NAAT: SARS CoV2 RNA: DETECTED — AB

## 2021-05-10 ENCOUNTER — Telehealth: Payer: Self-pay | Admitting: Internal Medicine

## 2021-05-10 NOTE — Telephone Encounter (Signed)
Faxed Positive Home COVID results to [facility] along with demographics.  Positive as of 05/07/2021

## 2021-05-20 NOTE — Patient Instructions (Addendum)
Rest and drink plenty of fluids.  Take Tessalon Perles 100 mg up to 3 times daily as needed for cough.  Continue with current low-dose prednisone as prescribed by rheumatologist for polymyalgia rheumatica.  Call if symptoms worsen.  Monitor pulse oximetry at home.

## 2021-06-12 ENCOUNTER — Other Ambulatory Visit: Payer: Self-pay | Admitting: Obstetrics & Gynecology

## 2021-11-08 ENCOUNTER — Other Ambulatory Visit: Payer: Self-pay | Admitting: Urology

## 2021-11-08 DIAGNOSIS — D1771 Benign lipomatous neoplasm of kidney: Secondary | ICD-10-CM

## 2021-11-27 ENCOUNTER — Ambulatory Visit
Admission: RE | Admit: 2021-11-27 | Discharge: 2021-11-27 | Disposition: A | Discharge status: Home / Self Care | Payer: Managed Care, Other (non HMO) | Source: Ambulatory Visit | Attending: Urology | Admitting: Urology

## 2021-11-27 DIAGNOSIS — D1771 Benign lipomatous neoplasm of kidney: Secondary | ICD-10-CM

## 2021-12-06 ENCOUNTER — Telehealth: Payer: Self-pay | Admitting: Internal Medicine

## 2021-12-06 ENCOUNTER — Other Ambulatory Visit: Payer: Self-pay

## 2021-12-06 ENCOUNTER — Telehealth (INDEPENDENT_AMBULATORY_CARE_PROVIDER_SITE_OTHER): Payer: Managed Care, Other (non HMO) | Admitting: Internal Medicine

## 2021-12-06 ENCOUNTER — Encounter: Payer: Self-pay | Admitting: Internal Medicine

## 2021-12-06 VITALS — BP 170/90 | Temp 98.0°F

## 2021-12-06 DIAGNOSIS — J22 Unspecified acute lower respiratory infection: Secondary | ICD-10-CM | POA: Diagnosis not present

## 2021-12-06 MED ORDER — AZITHROMYCIN 250 MG PO TABS: ORAL_TABLET | ORAL | 0 refills | Status: AC

## 2021-12-06 MED ORDER — BENZONATATE 100 MG PO CAPS: 100.0000 mg | ORAL_CAPSULE | Freq: Three times a day (TID) | ORAL | 0 refills | Status: DC | PRN

## 2021-12-06 NOTE — Telephone Encounter (Signed)
Tara Stanley [phone]  [name] called to say this morning she woke up had little stuffy nose and cough like she normal does in the morning but she also had a feeling of heaviness in her chest like she was going to get a cold so she did a COVID test and it was positive, the swab had a little blood on it so she did another one and it was negative, so she is wandering if she needs a real test instead of a home test or if she really has COVID, she does not feel bad, has not been around anyone that she knows of.

## 2021-12-06 NOTE — Telephone Encounter (Signed)
Scheduled video visit 

## 2021-12-06 NOTE — Patient Instructions (Signed)
Patient will call me with results of COVID PCR test later today or tomorrow.  She will take Zithromax Z-PAK 2 tabs day 1 followed by 1 tab days 2 through 5.  Will take Tessalon Perles 100 mg up to 3 times daily as needed for cough.  She will monitor her pulse oximetry.  She will walk to prevent atelectasis and monitor her symptoms closely.

## 2021-12-06 NOTE — Progress Notes (Signed)
   Subjective:    Patient ID: Tara Stanley, female    DOB: [birth year], 65 y.o.   MRN: [id number]  HPI 65 year old Female seen today by interactive audio and video telecommunications due to coronavirus pandemic.  She is agreeable to visit in this format today.  She is identified using 2 identifiers as Tara Stanley. Ke, a patient in this practice.  She is at her home and I am at my office.  Patient called saying that she had mild respiratory infection symptoms.  Noticed a slight heaviness in her chest and slight respiratory congestion.  She took a home COVID test that turned out to be positive.  She took a second COVID test and it was negative.  She has scheduled a PCR test at [facility] for later today.  Suggested she may want to take a third home COVID test later today.  She is scheduled to travel by airplane to [place] later this week.  She had a mild case of COVID-19 in May.  Her last booster was in October 2022.  She has had a total of 5 COVID vaccines by our records.  She has also had flu vaccine in late October.  She [facility].  She has a history of polymyalgia rheumatica.  She currently is not on steroid medication or any monoclonal antibody medication.  History of angiomyolipoma of kidney and essential hypertension.  Formerly smoked 2 packs/day for some 20 years but quit in the mid 1990s.  She had lab work drawn yesterday at [facility] with results being reviewed today and are stable.  Sed rate was 2.    Review of Systems see above-denies headache, shortness of breath, significant cough, fever or shaking chills     Objective:   Physical Exam  She reports her blood pressure is elevated due to anxiety about possibly being diagnosed with COVID-19.  Generally blood pressure is stable.      Assessment & Plan:  Probable COVID-19 virus infection  Plan: We discussed whether she should take Paxlovid or not.  She is mildly ill  and thinks maybe she will not take that.  She is going to await results from her PCR test at [facility].  She would like to have something for cough.  We have sent in Tessalon Perles 100 mg 3 times daily as needed.  I will also send in a Zithromax Z-PAK 2 tabs day 1 followed by 1 tab days 2 through 5.  She reminds me she had a very mild case in May as well.  Time spent with video visit is 20 minutes

## 2021-12-07 ENCOUNTER — Encounter: Payer: Self-pay | Admitting: Internal Medicine

## 2021-12-08 ENCOUNTER — Encounter: Payer: Self-pay | Admitting: Internal Medicine

## 2021-12-14 ENCOUNTER — Telehealth: Payer: Self-pay | Admitting: Internal Medicine

## 2021-12-14 NOTE — Telephone Encounter (Signed)
[place] results to [facility] [phone], phone [phone]  +COVID 12/06/2021

## 2021-12-29 ENCOUNTER — Telehealth (INDEPENDENT_AMBULATORY_CARE_PROVIDER_SITE_OTHER): Payer: Managed Care, Other (non HMO) | Admitting: Internal Medicine

## 2021-12-29 ENCOUNTER — Encounter: Payer: Self-pay | Admitting: Internal Medicine

## 2021-12-29 DIAGNOSIS — J0100 Acute maxillary sinusitis, unspecified: Secondary | ICD-10-CM

## 2021-12-29 MED ORDER — DOXYCYCLINE HYCLATE 100 MG PO TABS: 100.0000 mg | ORAL_TABLET | Freq: Two times a day (BID) | ORAL | 0 refills | Status: DC

## 2021-12-29 NOTE — Telephone Encounter (Addendum)
Patient called the office around 11:30 AM December 29, 2021.  She recently had COVID-19 in early December and had video visit on December 7.  She was prescribed a Zithromax Z-PAK and Tessalon Perles.  This was her second bout of COVID-19.  She had a previous bout in May 2022.  Today she has sinusitis symptoms with bilateral maxillary sinus pressure.  No fever.  She is requesting an antibiotic.  She has no chills, nausea or vomiting.  No headache.  She is identified using 2 identifiers as Tara Stanley. [name], a patient in this practice.   She is at her home and I am at my office.  She is agreeable to visit in this format today. The office is closed for  the New Years holiday,so we connected by telephone only.   She has history of Polymyalgia rheumatica but currently takes no medication for this condition.  She has essential hypertension treated with losartan 100 mg daily.  Patient sounds nasally congested when she speaks.  Plan: She will be prescribed doxycycline 100 mg twice daily for 10 days.  Time spent reviewing medical record, speaking with patient and E scribing medication is 15 minutes.

## 2022-03-12 ENCOUNTER — Ambulatory Visit (HOSPITAL_BASED_OUTPATIENT_CLINIC_OR_DEPARTMENT_OTHER): Payer: Managed Care, Other (non HMO) | Admitting: Obstetrics & Gynecology

## 2022-03-15 ENCOUNTER — Other Ambulatory Visit: Payer: Self-pay

## 2022-03-15 ENCOUNTER — Encounter (HOSPITAL_BASED_OUTPATIENT_CLINIC_OR_DEPARTMENT_OTHER): Payer: Self-pay | Admitting: Obstetrics & Gynecology

## 2022-03-15 ENCOUNTER — Ambulatory Visit (INDEPENDENT_AMBULATORY_CARE_PROVIDER_SITE_OTHER): Payer: Self-pay | Admitting: Obstetrics & Gynecology

## 2022-03-15 VITALS — BP 157/95 | HR 77 | Ht 66.5 in | Wt 156.4 lb

## 2022-03-15 DIAGNOSIS — Z780 Asymptomatic menopausal state: Secondary | ICD-10-CM

## 2022-03-15 DIAGNOSIS — Z01419 Encounter for gynecological examination (general) (routine) without abnormal findings: Secondary | ICD-10-CM

## 2022-03-15 DIAGNOSIS — M353 Polymyalgia rheumatica: Secondary | ICD-10-CM

## 2022-03-15 DIAGNOSIS — M8580 Other specified disorders of bone density and structure, unspecified site: Secondary | ICD-10-CM

## 2022-03-15 DIAGNOSIS — N952 Postmenopausal atrophic vaginitis: Secondary | ICD-10-CM

## 2022-03-15 MED ORDER — ESTRING 2 MG VA RING: VAGINAL_RING | VAGINAL | 4 refills | Status: DC

## 2022-03-15 NOTE — Progress Notes (Signed)
66 y.o. G2P2 [name] here for breast and pelvic exam.  Doing well.  Family is well.  Had lab work and bone density with [facility] [facility] program.  Reviewed in [place].  BMD showed T score -1.7 in lumbar spine and -1.9 in hip.  FRAX risks 3.8%/21% in next 10 years.  This was fairly stable from comparison done in 2020.  On Raloxifene.  Having no side effects.   ? ?Denies vaginal bleeding.  Uses Estring and desires to continue.  No issues with this. ? ?Patient's last menstrual period was 01/01/2008.          ?Sexually active: Yes.    ?H/O STD:  no ? ?Health Maintenance: ?PCP:  Dr. Renold Genta.  Last wellness appt was 11/2021 with [facility].  Did blood work at that appt:   ?Vaccines are up to date:  yes ?Colonoscopy:  03/19/2012.  Pt aware follow up due this year. ?MMG:  05/03/2021 Negative ?BMD:  11/2021 at [facility] ?Last pap smear:  03/10/2021 Negative.   ?H/o abnormal pap smear:  no ? ? reports that she quit smoking about 28 years ago. Her smoking use included cigarettes. She has a 40.00 pack-year smoking history. She has never used smokeless tobacco. She reports current alcohol use of about 21.0 standard drinks per week. She reports current drug use. ? ?Past Medical History:  ?Diagnosis Date  ? Arthritis   ? osteoarthritis  ? History of frequent urinary tract infections   ? on prophalaytic medication   ? Hypertension   ? Melanoma ([place])   ? right upper arm  ? Osteomyelitis ([place]) 1967  ? left leg  ? Osteopenia   ? Renal cyst   ? benign, found on CT scan, is being followed  ? Spinal stenosis   ? mild hx of herniated disc  ? Thyroid nodule   ?  nodule, hypothyroidism pt was on synthyroid but is not having to take currently pt states her MD states she does not have issues with   ? ? ?Past Surgical History:  ?Procedure Laterality Date  ? APPENDECTOMY  03/2011  ? laparoscopic  ? CATARACT EXTRACTION, BILATERAL  06/2015  ? left tibia Left 1967  ? exploratory surgery and treatment of osteomyelitis  ?  SKIN CANCER EXCISION    ? right upper arm  ? TOTAL KNEE ARTHROPLASTY Bilateral 03/09/2015  ? Procedure: BILATERAL TOTAL KNEE ARTHROPLASTY;  Surgeon: Gaynelle Arabian, MD;  Location: [facility];  Service: Orthopedics;  Laterality: Bilateral;  ? ? ?Current Outpatient Medications  ?Medication Sig Dispense Refill  ? estradiol (ESTRING) 2 MG vaginal ring INSERT 1 RING VAGINALLY EVERY 3 MONTHS 1 each 4  ? losartan (COZAAR) 100 MG tablet TAKE 1 TABLET DAILY 30 tablet 0  ? Multiple Vitamins-Minerals (CENTRUM ULTRA WOMENS PO) Take 1 tablet by mouth daily.    ? raloxifene (EVISTA) 60 MG tablet TAKE 1 TABLET DAILY 90 tablet 3  ? benzonatate (TESSALON) 100 MG capsule Take 1 capsule (100 mg total) by mouth 3 (three) times daily as needed for cough. (Patient not taking: Reported on 03/15/2022) 30 capsule 0  ? Cholecalciferol (VITAMIN D3) 5000 units TABS Take 1 tablet by mouth daily.    ? doxycycline (VIBRA-TABS) 100 MG tablet Take 1 tablet (100 mg total) by mouth 2 (two) times daily. (Patient not taking: Reported on 03/15/2022) 20 tablet 0  ? ?No current facility-administered medications for this visit.  ? ? ?Family History  ?Problem Relation Age of Onset  ?  Diabetes Mother   ?     borderline type II  ? Hypertension Mother   ? Arthritis Mother   ? Hypertension Father   ? Stroke Father   ? COPD Father   ? Hypertension Sister   ? Osteoarthritis Sister   ? Hypertension Brother   ? Osteoarthritis Brother   ? Hypertension Other   ?     all grandparents  ? Stroke Maternal Grandmother   ? Osteoporosis Maternal Grandmother   ? Stroke Paternal Grandfather   ? Aneurysm Maternal Aunt 63  ? Colon cancer Neg Hx   ? Esophageal cancer Neg Hx   ? Stomach cancer Neg Hx   ? Rectal cancer Neg Hx   ? Thyroid disease Neg Hx   ? ? ?Review of Systems ? ?Exam:   ?BP (!) 157/95 (BP Location: Right Arm, Patient Position: Sitting, Cuff Size: Normal)   Pulse 77   Ht 5' 6.5" (1.689 m)   Wt 156 lb 6.4 oz (70.9 kg)   LMP 01/01/2008   BMI 24.87 kg/m?   Height: 5'  6.5" (168.9 cm) ? ?General appearance: alert, cooperative and appears stated age ?Breasts: normal appearance, no masses or tenderness ?Abdomen: soft, non-tender; bowel sounds normal; no masses,  no organomegaly ?Lymph nodes: Cervical, supraclavicular, and axillary nodes normal.  No abnormal inguinal nodes palpated ?Neurologic: Grossly normal ? ?Pelvic: External genitalia:  no lesions ?             Urethra:  normal appearing urethra with no masses, tenderness or lesions ?             Bartholins and Skenes: normal   ?             Vagina: normal appearing vagina with atrophic changes and no discharge, no lesions ?             Cervix: no lesions ?             Pap taken: No. ?Bimanual Exam:  Uterus:  normal size, contour, position, consistency, mobility, non-tender ?             Adnexa: normal adnexa and no mass, fullness, tenderness ?              Rectovaginal: Confirms ?              Anus:  normal sphincter tone, no lesions ? ?Chaperone, Octaviano Batty, CMA, was present for exam. ? ?Assessment/Plan: ?1. Encounter for routine gynecologic examination in Medicare patient ?- pap neg 03/10/2021 ?- MMG 05/03/2021 ?- BMD reviewed in Care Everywhere ?- colonoscoyp 03/19/2012.  Aware due this year ?- lab work reviewed in care everywhere ?- vaccines reviewed/updated ? ?2. Osteopenia, unspecified location ? ? ?3. Vaginal atrophy ?- estradiol (ESTRING) 2 MG vaginal ring; INSERT 1 RING VAGINALLY EVERY 3 MONTHS  Dispense: 1 each; Refill: 4 ? ?4. Postmenopausal ?- no HRT use ? ?5. Polymyalgia rheumatica ([place]) ?- followed by [facility]  ? ? ?

## 2022-03-18 DIAGNOSIS — N952 Postmenopausal atrophic vaginitis: Secondary | ICD-10-CM | POA: Insufficient documentation

## 2022-03-28 ENCOUNTER — Encounter: Payer: Self-pay | Admitting: Internal Medicine

## 2022-04-02 ENCOUNTER — Other Ambulatory Visit: Payer: Self-pay | Admitting: Obstetrics & Gynecology

## 2022-04-02 DIAGNOSIS — Z1231 Encounter for screening mammogram for malignant neoplasm of breast: Secondary | ICD-10-CM

## 2022-05-07 ENCOUNTER — Ambulatory Visit
Admission: RE | Admit: 2022-05-07 | Discharge: 2022-05-07 | Disposition: A | Discharge status: Home / Self Care | Payer: Managed Care, Other (non HMO) | Source: Ambulatory Visit | Attending: Obstetrics & Gynecology | Admitting: Obstetrics & Gynecology

## 2022-05-07 DIAGNOSIS — Z1231 Encounter for screening mammogram for malignant neoplasm of breast: Secondary | ICD-10-CM

## 2022-06-13 ENCOUNTER — Other Ambulatory Visit (HOSPITAL_BASED_OUTPATIENT_CLINIC_OR_DEPARTMENT_OTHER): Payer: Self-pay | Admitting: Obstetrics & Gynecology

## 2022-09-13 ENCOUNTER — Telehealth: Payer: Managed Care, Other (non HMO) | Admitting: Internal Medicine

## 2022-09-13 ENCOUNTER — Encounter: Payer: Self-pay | Admitting: Internal Medicine

## 2022-09-13 ENCOUNTER — Telehealth: Payer: Self-pay | Admitting: Internal Medicine

## 2022-09-13 VITALS — BP 148/95 | Temp 99.6°F | Wt 150.0 lb

## 2022-09-13 DIAGNOSIS — U071 COVID-19: Secondary | ICD-10-CM

## 2022-09-13 DIAGNOSIS — I10 Essential (primary) hypertension: Secondary | ICD-10-CM

## 2022-09-13 DIAGNOSIS — Z8739 Personal history of other diseases of the musculoskeletal system and connective tissue: Secondary | ICD-10-CM

## 2022-09-13 MED ORDER — NIRMATRELVIR/RITONAVIR (PAXLOVID)TABLET: 3.0000 | ORAL_TABLET | Freq: Two times a day (BID) | ORAL | 0 refills | Status: AC

## 2022-09-13 NOTE — Progress Notes (Signed)
   Subjective:    Patient ID: Tara Stanley, female    DOB: 1956/03/25, 66 y.o.   MRN: [id number]  HPI  66 year old Female seen via interactive audio and video telecommunications.  She is agreeable to visit in this format today.  She is identified using 2 identifiers as Tara Stanley. Darwish, a patient in this practice.  She is at her second home in [place], and I am at my office.  Her primary residence is here in [place].  Patient was scheduled to leave today for a trip with her husband to [place] but has tested positive for COVID-19.  She has upper respiratory infection symptoms.  Has some malaise but not severe.  She has no nausea, vomiting, significant headache, high fever or chills.  She was diagnosed with COVID-19 in December 2022 and also had a mild case of COVID-19 in May 2022.  She has had numerous vaccinations for COVID-19.  She has a history of hypertension treated with losartan.  She has a history of polymyalgia rheumatica and osteopenia.  History of angiomyolipoma of kidney.  GYN physician is Dr. Edwinna Areola here in [place].    Review of Systems see above regarding symptoms     Objective:   Physical Exam Seen virtually in no acute distress       Assessment & Plan:  Acute COVID-19 virus infection.  This is the third time she has had COVID-19.  Paxlovid regular strength was E scribed to pharmacy in [place] as requested by patient.  She is to quarantine for 5 days.  Has to wait 2 months to receive updated COVID-vaccine.  She will call if symptoms worsen or not improving.  Essential hypertension treated with losartan by Dr. Linward Foster at [facility]  History of polymyalgia rheumatica  Osteopenia treated with Evista per Dr. Sabra Heck, GYN  History of angiolipoma of kidney  Plan: Records reviewed from [facility].  Patient will be prescribed Paxlovid regular strength.  This was E scribed through epic today to pharmacy in [place].  She has  normal renal functions.  She will quarantine for 5 days.  Recommend deferring new COVID  vaccine for 2 months.  Walk some to prevent atelectasis and monitor pulse oximetry.  Call if short of breath or worsening symptoms.

## 2022-09-13 NOTE — Telephone Encounter (Signed)
Tara Stanley [phone]  [name] called to say she tested positive for Covid this morning. She woke up at 3:30 AM this morning feeling bad with runny nose, sore throat, body aches, fever ( 99.9) and congestion. She tested for COVID and it was positive. Scheduled her for Video visit, she can get blood pressure and temperature, will set up thru mychart.

## 2022-09-13 NOTE — Patient Instructions (Signed)
Patient prescribed Paxlovid electronically.  She has normal renal functions.  These that use were checked today before prescribing Paxlovid.  She is to quarantine for 5 days.  Is to wait 2 months before having COVID booster.  Rest and stay well-hydrated.  Monitor herself for shortness of breath.  Call if symptoms worsen.

## 2023-01-11 ENCOUNTER — Encounter: Payer: Self-pay | Admitting: Internal Medicine

## 2023-01-11 ENCOUNTER — Telehealth: Payer: Self-pay | Admitting: Internal Medicine

## 2023-01-11 MED ORDER — CLOTRIMAZOLE-BETAMETHASONE 1-0.05 % EX CREA: 1.0000 | TOPICAL_CREAM | Freq: Two times a day (BID) | CUTANEOUS | 2 refills | Status: DC

## 2023-01-11 NOTE — Telephone Encounter (Signed)
Called to let Annmarie know Dr Renold Genta called in something for her

## 2023-01-11 NOTE — Telephone Encounter (Signed)
[name] [phone]  Tara Stanley woke up this morning with exterior redness and itchy, no noticeable discharge. Labial area. Unfortunately she is leaving around noon to go to airport to go out of town. She can in before that or do virtual visit.

## 2023-01-11 NOTE — Telephone Encounter (Signed)
Spoke with patient. Sending in Lotrisone Cream  to apply to external genitalia twice a day with refills. Patient leaving town soon. MJB, MD

## 2023-02-27 ENCOUNTER — Telehealth: Payer: Self-pay

## 2023-02-27 ENCOUNTER — Encounter: Payer: Self-pay | Admitting: Internal Medicine

## 2023-02-27 NOTE — Telephone Encounter (Signed)
Patient called asking if message had been received concerning Plaxlovid,  I informed patient that it has and she and her husband would need to schedule an office visit. Patient said she would call back after speaking with her husband.

## 2023-02-28 ENCOUNTER — Ambulatory Visit: Payer: Managed Care, Other (non HMO) | Admitting: Internal Medicine

## 2023-02-28 ENCOUNTER — Encounter: Payer: Self-pay | Admitting: Internal Medicine

## 2023-02-28 VITALS — BP 126/88 | HR 76 | Temp 98.4°F | Ht 66.5 in | Wt 158.8 lb

## 2023-02-28 DIAGNOSIS — Z7184 Encounter for health counseling related to travel: Secondary | ICD-10-CM

## 2023-02-28 MED ORDER — NIRMATRELVIR/RITONAVIR (PAXLOVID)TABLET: 3.0000 | ORAL_TABLET | Freq: Two times a day (BID) | ORAL | 0 refills | Status: AC

## 2023-02-28 NOTE — Telephone Encounter (Signed)
scheduled

## 2023-02-28 NOTE — Patient Instructions (Addendum)
Paxlovid regular strength prescribed as desired by patient to take on upcoming trip to [place].

## 2023-02-28 NOTE — Progress Notes (Signed)
Patient Care Team: Elby Showers, MD as PCP - General (Internal Medicine)  Visit Date: 02/28/23  Subjective:    Patient ID: Tara Stanley , Female   DOB: December 16, 1956, 67 y.o.    MRN: [id number]   67 y.o. Female presents today for travel advice encounter. Patient has a past medical history of arthritis, frequent UTI, hypertension, melanoma, osteomyelitis, osteopenia, renal cyst, spinal stenosis, thyroid nodule.  Going on a trip with husband to [place] and requesting prophylactic Paxlovid as it is harder to obtain in [place]. GFR at 94 cc/min on 01/08/23.  [name] has hx of HTN treated with losartan. General health is excellent. Labs done in [place] at [place] were reviewed today including renal functions and are WNL.   Past Medical History:  Diagnosis Date   Arthritis    osteoarthritis   History of frequent urinary tract infections    on prophalaytic medication    Hypertension    Melanoma ([place])    right upper arm   Osteomyelitis ([place]) 1967   left leg   Osteopenia    Renal cyst    benign, found on CT scan, is being followed   Spinal stenosis    mild hx of herniated disc   Thyroid nodule     nodule, hypothyroidism pt was on synthyroid but is not having to take currently pt states her MD states she does not have issues with      Family History  Problem Relation Age of Onset   Diabetes Mother        borderline type II   Hypertension Mother    Arthritis Mother    Hypertension Father    Stroke Father    COPD Father    Hypertension Sister    Osteoarthritis Sister    Hypertension Brother    Osteoarthritis Brother    Hypertension Other        all grandparents   Stroke Maternal Grandmother    Osteoporosis Maternal Grandmother    Stroke Paternal Grandfather    Aneurysm Maternal Aunt 63   Colon cancer Neg Hx    Esophageal cancer Neg Hx    Stomach cancer Neg Hx    Rectal cancer Neg Hx    Thyroid disease Neg Hx     Social History   Social History Narrative    Not on file      Review of Systems  Constitutional:  Negative for fever and malaise/fatigue.  HENT:  Negative for congestion.   Eyes:  Negative for blurred vision.  Respiratory:  Negative for cough and shortness of breath.   Cardiovascular:  Negative for chest pain, palpitations and leg swelling.  Gastrointestinal:  Negative for vomiting.  Musculoskeletal:  Negative for back pain.  Skin:  Negative for rash.  Neurological:  Negative for loss of consciousness and headaches.        Objective:   Vitals: BP 126/88   Pulse 76   Temp 98.4 F (36.9 C) (Tympanic)   Ht 5' 6.5" (1.689 m)   Wt 158 lb 12.8 oz (72 kg)   LMP 01/01/2008   SpO2 98%   BMI 25.25 kg/m    Physical Exam Vitals and nursing note reviewed.  Constitutional:      General: She is not in acute distress.    Appearance: Normal appearance. She is not toxic-appearing.  HENT:     Head: Normocephalic and atraumatic.  Pulmonary:     Effort: Pulmonary effort is normal.  Skin:  General: Skin is warm and dry.  Neurological:     Mental Status: She is alert and oriented to person, place, and time. Mental status is at baseline.  Psychiatric:        Mood and Affect: Mood normal.        Behavior: Behavior normal.        Thought Content: Thought content normal.        Judgment: Judgment normal.       Results:   Studies obtained and personally reviewed by me:   Labs:       Component Value Date/Time   NA 135 03/15/2015 0605   K 3.4 (L) 03/15/2015 0605   CL 96 03/15/2015 0605   CO2 33 (H) 03/15/2015 0605   GLUCOSE 117 (H) 03/15/2015 0605   BUN 12 03/15/2015 0605   CREATININE 0.54 03/15/2015 0605   CREATININE 0.64 01/26/2014 1255   CALCIUM 9.0 03/15/2015 0605   PROT 5.9 (L) 03/15/2015 0605   ALBUMIN 2.9 (L) 03/15/2015 0605   AST 20 03/15/2015 0605   ALT 21 03/15/2015 0605   ALKPHOS 48 03/15/2015 0605   BILITOT 0.8 03/15/2015 0605   GFRNONAA >90 03/15/2015 0605   GFRAA >90 03/15/2015 0605     Lab  Results  Component Value Date   WBC 5.7 03/15/2015   HGB 9.0 (L) 03/15/2015   HCT 26.9 (L) 03/15/2015   MCV 91.8 03/15/2015   PLT 297 03/15/2015    No results found for: "CHOL", "HDL", "LDLCALC", "LDLDIRECT", "TRIG", "CHOLHDL"  No results found for: "HGBA1C"   Lab Results  Component Value Date   TSH 0.83 04/07/2018      Assessment & Plan:   Travel Advice Encounter: Prescribed Paxlovid regular strength take as directed for upcoming [place] trip. GFR adequate at 94 cc/min on 01/08/23.    I,Alexander Ruley,acting as a Education administrator for Elby Showers, MD.,have documented all relevant documentation on the behalf of Elby Showers, MD,as directed by  Elby Showers, MD while in the presence of Elby Showers, MD.   I, Elby Showers, MD, have reviewed all documentation for this visit. The documentation on 02/28/23 for the exam, diagnosis, procedures, and orders are all accurate and complete.

## 2023-06-10 ENCOUNTER — Other Ambulatory Visit (HOSPITAL_BASED_OUTPATIENT_CLINIC_OR_DEPARTMENT_OTHER): Payer: Self-pay | Admitting: Obstetrics & Gynecology

## 2023-08-01 ENCOUNTER — Ambulatory Visit: Payer: Managed Care, Other (non HMO) | Admitting: Internal Medicine

## 2023-08-01 ENCOUNTER — Encounter: Payer: Self-pay | Admitting: Internal Medicine

## 2023-08-01 ENCOUNTER — Telehealth: Payer: Self-pay | Admitting: Internal Medicine

## 2023-08-01 VITALS — BP 136/86 | HR 79 | Wt 150.0 lb

## 2023-08-01 DIAGNOSIS — J069 Acute upper respiratory infection, unspecified: Secondary | ICD-10-CM | POA: Diagnosis not present

## 2023-08-01 DIAGNOSIS — H1032 Unspecified acute conjunctivitis, left eye: Secondary | ICD-10-CM

## 2023-08-01 DIAGNOSIS — I10 Essential (primary) hypertension: Secondary | ICD-10-CM | POA: Diagnosis not present

## 2023-08-01 MED ORDER — OFLOXACIN 0.3 % OP SOLN: OPHTHALMIC | 0 refills | Status: DC

## 2023-08-01 MED ORDER — AZITHROMYCIN 250 MG PO TABS: ORAL_TABLET | ORAL | 0 refills | Status: AC

## 2023-08-01 NOTE — Telephone Encounter (Signed)
Raegen Ton [phone]  [name] called to say she has stuffy head, Eyes have stuff in them, yellowish mucus, COVID negative. I scheduled her for 3:30 this afternoon and told her to wear mask.

## 2023-08-01 NOTE — Progress Notes (Signed)
Patient Care Team: [name], MD as PCP - General (Internal Medicine)  Visit Date: 08/01/23  Subjective:    Patient ID: Tara Stanley , Female   DOB: 06-Jan-1956, 67 y.o.    MRN: [id number]   67 y.o. Female presents today for nasal congestion, sinus pressure, eye discharge since 07/31/23. Awoke today with crusting in left eye. Reports negative at-home Covid-19 test taken today. Denies fever, chills, nausea, vomiting, headaches. No known Covid-19 exposure. No one in her household is sick. Appetite normal.  Past Medical History:  Diagnosis Date   Arthritis    osteoarthritis   History of frequent urinary tract infections    on prophalaytic medication    Hypertension    Melanoma ([facility])    right upper arm   Osteomyelitis ([facility]) 1967   left leg   Osteopenia    Renal cyst    benign, found on CT scan, is being followed   Spinal stenosis    mild hx of herniated disc   Thyroid nodule     nodule, hypothyroidism pt was on synthyroid but is not having to take currently pt states her MD states she does not have issues with      Family History  Problem Relation Age of Onset   Diabetes Mother        borderline type II   Hypertension Mother    Arthritis Mother    Hypertension Father    Stroke Father    COPD Father    Hypertension Sister    Osteoarthritis Sister    Hypertension Brother    Osteoarthritis Brother    Hypertension Other        all grandparents   Stroke Maternal Grandmother    Osteoporosis Maternal Grandmother    Stroke Paternal Grandfather    Aneurysm Maternal Aunt 63   Colon cancer Neg Hx    Esophageal cancer Neg Hx    Stomach cancer Neg Hx    Rectal cancer Neg Hx    Thyroid disease Neg Hx     Social Hx: Married. Former smoker quit over 25 years ago. Consumes alcohol 2-3 drinks daily.  Additional Hx: History of lumbar spinal stenosis.  Melanoma right arm 2007.  Right knee medial meniscal tear in November 2011 status post surgery by Dr. Despina Hick.  Left  tibia osteomyelitis 1969.   Review of Systems  Constitutional:  Negative for chills, fever and malaise/fatigue.  HENT:  Positive for congestion (Nasal) and sinus pain.   Eyes:  Positive for discharge (Left eye). Negative for blurred vision.  Respiratory:  Negative for cough and shortness of breath.   Cardiovascular:  Negative for chest pain, palpitations and leg swelling.  Gastrointestinal:  Negative for nausea and vomiting.  Musculoskeletal:  Negative for back pain.  Skin:  Negative for rash.  Neurological:  Negative for loss of consciousness and headaches.        Objective:   Vitals: BP 136/86 (BP Location: Right Arm, Patient Position: Sitting, Cuff Size: Large)   Pulse 79   Wt 150 lb (68 kg)   LMP 01/01/2008   SpO2 97%   BMI 23.85 kg/m    Physical Exam Vitals and nursing note reviewed.  Constitutional:      General: She is not in acute distress.    Appearance: Normal appearance. She is not toxic-appearing.  HENT:     Head: Normocephalic and atraumatic.  Eyes:     Comments: Left sclera injected without drainage.  Pulmonary:  Effort: Pulmonary effort is normal.  Skin:    General: Skin is warm and dry.  Neurological:     Mental Status: She is alert and oriented to person, place, and time. Mental status is at baseline.  Psychiatric:        Mood and Affect: Mood normal.        Behavior: Behavior normal.        Thought Content: Thought content normal.        Judgment: Judgment normal.       Results:   Studies obtained and personally reviewed by me:   Labs:       Component Value Date/Time   NA 135 03/15/2015 0605   K 3.4 (L) 03/15/2015 0605   CL 96 03/15/2015 0605   CO2 33 (H) 03/15/2015 0605   GLUCOSE 117 (H) 03/15/2015 0605   BUN 12 03/15/2015 0605   CREATININE 0.54 03/15/2015 0605   CREATININE 0.64 01/26/2014 1255   CALCIUM 9.0 03/15/2015 0605   PROT 5.9 (L) 03/15/2015 0605   ALBUMIN 2.9 (L) 03/15/2015 0605   AST 20 03/15/2015 0605   ALT 21  03/15/2015 0605   ALKPHOS 48 03/15/2015 0605   BILITOT 0.8 03/15/2015 0605   GFRNONAA >90 03/15/2015 0605   GFRAA >90 03/15/2015 0605     Lab Results  Component Value Date   WBC 5.7 03/15/2015   HGB 9.0 (L) 03/15/2015   HCT 26.9 (L) 03/15/2015   MCV 91.8 03/15/2015   PLT 297 03/15/2015    No results found for: "CHOL", "HDL", "LDLCALC", "LDLDIRECT", "TRIG", "CHOLHDL"  No results found for: "HGBA1C"   Lab Results  Component Value Date   TSH 0.83 04/07/2018      Assessment & Plan:   Upper respiratory infection; left eye conjunctivitis: prescribed Ocuflox 0.3% two drops in left eye four times a day for five days, Z-Pak two tabs day 1 followed by one tab days 2-5. Contact us if symptoms do not improve in 5 to 7 days or sooner if worse.    I,Alexander Ruley,acting as a Neurosurgeon for [name], MD.,have documented all relevant documentation on the behalf of [name], MD,as directed by  [name], MD while in the presence of [name], MD.   I, [name], MD, have reviewed all documentation for this visit. The documentation on 08/15/23 for the exam, diagnosis, procedures, and orders are all accurate and complete.

## 2023-08-15 NOTE — Patient Instructions (Addendum)
It was a pleasure to see you today.  We are sorry you are not feeling well.  Please use Ocuflox ophthalmic drops 2 drops in left eye 4 times a day for 5 days.  Please take Zithromax Z-PAK 2 tabs on day 1 followed by 1 tab days 2 through 5 for an acute upper respiratory infection.  Please call us if not better in 5 to 7 days or sooner if worse.  Rest and stay well-hydrated.

## 2023-08-30 ENCOUNTER — Other Ambulatory Visit (HOSPITAL_BASED_OUTPATIENT_CLINIC_OR_DEPARTMENT_OTHER): Payer: Self-pay | Admitting: Obstetrics & Gynecology

## 2023-08-30 DIAGNOSIS — N952 Postmenopausal atrophic vaginitis: Secondary | ICD-10-CM

## 2023-10-11 ENCOUNTER — Other Ambulatory Visit: Payer: Self-pay | Admitting: Internal Medicine

## 2023-10-11 DIAGNOSIS — Z1231 Encounter for screening mammogram for malignant neoplasm of breast: Secondary | ICD-10-CM

## 2023-10-12 ENCOUNTER — Other Ambulatory Visit: Payer: Self-pay | Admitting: Internal Medicine

## 2023-10-12 DIAGNOSIS — Z1231 Encounter for screening mammogram for malignant neoplasm of breast: Secondary | ICD-10-CM

## 2023-10-22 ENCOUNTER — Ambulatory Visit
Admission: RE | Admit: 2023-10-22 | Discharge: 2023-10-22 | Disposition: A | Discharge status: Home / Self Care | Payer: Managed Care, Other (non HMO) | Source: Ambulatory Visit

## 2023-10-22 DIAGNOSIS — Z1231 Encounter for screening mammogram for malignant neoplasm of breast: Secondary | ICD-10-CM

## 2023-10-28 ENCOUNTER — Ambulatory Visit: Payer: Managed Care, Other (non HMO) | Admitting: Internal Medicine

## 2023-10-28 ENCOUNTER — Telehealth: Payer: Self-pay | Admitting: Internal Medicine

## 2023-10-28 NOTE — Telephone Encounter (Signed)
scheduled

## 2023-10-28 NOTE — Telephone Encounter (Signed)
Tara Stanley [phone]  [name] called to say she thinks she is having a flare up of TMR, both her legs are hurting and her left hip and she has not seen the rheumatologist in [place] since 2021. Should she come and see you and get a referral to someone around here or try and go back to who she has seen in the past?

## 2023-10-28 NOTE — Telephone Encounter (Signed)
I called to schedule an appointment, she has to be somewhere this afternoon later, she wanted to see if she could come earlier today or tomorrow. She called the doctor at [facility] after I mentioned them and found out she would need a referral to go back there since it has been ahile since she has been there.

## 2023-10-29 ENCOUNTER — Ambulatory Visit: Payer: Managed Care, Other (non HMO) | Admitting: Internal Medicine

## 2023-10-29 ENCOUNTER — Encounter: Payer: Self-pay | Admitting: Internal Medicine

## 2023-10-29 VITALS — BP 110/80 | HR 94 | Ht 66.5 in | Wt 159.0 lb

## 2023-10-29 DIAGNOSIS — I10 Essential (primary) hypertension: Secondary | ICD-10-CM | POA: Diagnosis not present

## 2023-10-29 DIAGNOSIS — Z8739 Personal history of other diseases of the musculoskeletal system and connective tissue: Secondary | ICD-10-CM | POA: Diagnosis not present

## 2023-10-29 DIAGNOSIS — M791 Myalgia, unspecified site: Secondary | ICD-10-CM | POA: Diagnosis not present

## 2023-10-29 MED ORDER — METHYLPREDNISOLONE 4 MG PO TABS: ORAL_TABLET | ORAL | 0 refills | Status: DC

## 2023-10-29 NOTE — Patient Instructions (Signed)
Rheumatology labs have been drawn and will be faxed to [facility].  We have placed you on a Medrol 4 mg 6-day Dosepak to take in tapering course as prescribed starting with 6 tablets day 1 and decreasing by 1 tablet daily.  Referral will be made  to [facility] and we will fax labs to them along with this note.  As always, it was a pleasure to see you today.

## 2023-10-29 NOTE — Progress Notes (Signed)
Patient Care Team: [name], MD as PCP - General (Internal Medicine)  Visit Date: 10/29/23  Subjective:    Patient ID: Tara Stanley , Female   DOB: 1956-07-06, 67 y.o.    MRN: [id number]   67 y.o. Female presents today for myalgias. Pain is located mainly in left buttock and left leg and also in right buttock and right leg. History of Polymyalgia rheumatica. She is taking Advil without relief. She contacted [facility] yesterday and was told she needed to see her PCP for a referral.  Has been seen at [facility] but not in 3 years and she will be considered a new patient to them at this time.Denies fever, chills, cough. Denies injuries. Denies numbness/tingling in extremities. General health is excellent.  No radiculopathy symptoms such as numbness and tingling of the LEs. No fever chills, cough, chest pain, SOB  Past Medical History:  Diagnosis Date   Arthritis    osteoarthritis   History of frequent urinary tract infections    on prophalaytic medication    Hypertension    Melanoma ([facility])    right upper arm   Osteomyelitis ([facility]) 1967   left leg   Osteopenia    Renal cyst    benign, found on CT scan, is being followed   Spinal stenosis    mild hx of herniated disc   Thyroid nodule     nodule, hypothyroidism pt was on synthyroid but is not having to take currently pt states her MD states she does not have issues with      Family History  Problem Relation Age of Onset   Diabetes Mother        borderline type II   Hypertension Mother    Arthritis Mother    Hypertension Father    Stroke Father    COPD Father    Hypertension Sister    Osteoarthritis Sister    Hypertension Brother    Osteoarthritis Brother    Hypertension Other        all grandparents   Stroke Maternal Grandmother    Osteoporosis Maternal Grandmother    Stroke Paternal Grandfather    Aneurysm Maternal Aunt 63   Colon cancer Neg Hx    Esophageal cancer Neg Hx    Stomach  cancer Neg Hx    Rectal cancer Neg Hx    Thyroid disease Neg Hx     Social Hx: Married.  She is currently a nonsmoker.  Did smoke for some 20 years about 2 packs a day but quit in the mid 1990s.  Social alcohol consumption.   She does receive an annual exam through [facility].  Past medical history: Angiomyolipoma of kidney.  History of essential hypertension.  Tetanus immunization is up-to-date and was given in 2019.  Has had 2 Shingrix vaccines.  Has had RSV vaccine in October 2023.  Had pneumococcal vaccines in November 2020 and November 2021.  Last COVID-vaccine was August 2023 by records.  Had COVID 19 virus infection in December 2022, May 2022, September 2023.  Review of Systems  Constitutional:  Negative for fever and malaise/fatigue.  HENT:  Negative for congestion.   Eyes:  Negative for blurred vision.  Respiratory:  Negative for cough and shortness of breath.   Cardiovascular:  Negative for chest pain, palpitations and leg swelling.  Gastrointestinal:  Negative for vomiting.  Musculoskeletal:  Positive for myalgias (Buttocks, lower extremities). Negative for back pain.  Skin:  Negative for rash.  Neurological:  Negative for loss of consciousness and headaches.        Objective:   Vitals: BP 110/80   Pulse 94   Ht 5' 6.5" (1.689 m)   Wt 159 lb (72.1 kg)   LMP 01/01/2008   SpO2 97%   BMI 25.28 kg/m    Physical Exam Vitals and nursing note reviewed.  Constitutional:      General: She is not in acute distress.    Appearance: Normal appearance. She is not toxic-appearing.  HENT:     Head: Normocephalic and atraumatic.  Pulmonary:     Effort: Pulmonary effort is normal.  Musculoskeletal:     Comments: No inflammation in bilateral wrists, shoulders, knees, ankles- not hot, tender, swollen. 5/5 strength in upper and lower extremities.  Skin:    General: Skin is warm and dry.  Neurological:     Mental Status: She is alert and oriented to  person, place, and time. Mental status is at baseline.  Psychiatric:        Mood and Affect: Mood normal.        Behavior: Behavior normal.        Thought Content: Thought content normal.        Judgment: Judgment normal.       Results:   Studies obtained and personally reviewed by me:   Labs:       Component Value Date/Time   NA 135 03/15/2015 0605   K 3.4 (L) 03/15/2015 0605   CL 96 03/15/2015 0605   CO2 33 (H) 03/15/2015 0605   GLUCOSE 117 (H) 03/15/2015 0605   BUN 12 03/15/2015 0605   CREATININE 0.54 03/15/2015 0605   CREATININE 0.64 01/26/2014 1255   CALCIUM 9.0 03/15/2015 0605   PROT 5.9 (L) 03/15/2015 0605   ALBUMIN 2.9 (L) 03/15/2015 0605   AST 20 03/15/2015 0605   ALT 21 03/15/2015 0605   ALKPHOS 48 03/15/2015 0605   BILITOT 0.8 03/15/2015 0605   GFRNONAA >90 03/15/2015 0605   GFRAA >90 03/15/2015 0605     Lab Results  Component Value Date   WBC 5.7 03/15/2015   HGB 9.0 (L) 03/15/2015   HCT 26.9 (L) 03/15/2015   MCV 91.8 03/15/2015   PLT 297 03/15/2015        Lab Results  Component Value Date   TSH 0.83 04/07/2018      Assessment & Plan:   Myalgias buttocks and lower extremities: Previously has been diagnosed with PMR at [facility].  Prescribed Medrol 4 mg 6-day Dosepak and tapering course take as directed. Ordered CBC with Diff/Plat, sed rate, ANA, total CK, cyclic citrul peptide antibody, rheumatoid factor. Referral to [facility].  Will fax this note and results to [facility].  Essential hypertension-stable on current regimen of losartan and HCTZ 12.5 mg daily  History of angiomyolipoma of the kidney  Remote history of smoking  Remains on estrogen replacement consisting of a estradiol vaginal ring per GYN physician  Receives annual physical exams at [facility]  I,Alexander Ruley,acting as a scribe for [name], MD.,have documented all relevant documentation on the behalf of [name], MD,as directed by  [name], MD while in the presence of [name], MD.   I, [name], MD, have reviewed all documentation for this visit. The documentation on 10/29/23 for the exam, diagnosis, procedures, and orders are all accurate and complete.

## 2023-10-30 LAB — SEDIMENTATION RATE: Sed Rate: 2 mm/h (ref 0–30)

## 2023-10-30 LAB — CBC WITH DIFFERENTIAL/PLATELET
Absolute Lymphocytes: 2131 {cells}/uL (ref 850–3900)
Absolute Monocytes: 395 {cells}/uL (ref 200–950)
Basophils Absolute: 67 {cells}/uL (ref 0–200)
Basophils Relative: 1 %
Eosinophils Absolute: 208 {cells}/uL (ref 15–500)
Eosinophils Relative: 3.1 %
HCT: 41.8 % (ref 35.0–45.0)
Hemoglobin: 13.8 g/dL (ref 11.7–15.5)
MCH: 31.2 pg (ref 27.0–33.0)
MCHC: 33 g/dL (ref 32.0–36.0)
MCV: 94.4 fL (ref 80.0–100.0)
MPV: 11.1 fL (ref 7.5–12.5)
Monocytes Relative: 5.9 %
Neutro Abs: 3899 {cells}/uL (ref 1500–7800)
Neutrophils Relative %: 58.2 %
Platelets: 298 10*3/uL (ref 140–400)
RBC: 4.43 10*6/uL (ref 3.80–5.10)
RDW: 12.4 % (ref 11.0–15.0)
Total Lymphocyte: 31.8 %
WBC: 6.7 10*3/uL (ref 3.8–10.8)

## 2023-10-30 LAB — CYCLIC CITRUL PEPTIDE ANTIBODY, IGG: Cyclic Citrullin Peptide Ab: <16 U

## 2023-10-30 LAB — CK, TOTAL(REFL): Total CK: 87 U/L (ref 29–143)

## 2023-10-30 LAB — RHEUMATOID FACTOR: Rheumatoid fact SerPl-aCnc: <10 [IU]/mL

## 2023-10-30 LAB — ANA: Anti Nuclear Antibody (ANA): NEGATIVE

## 2023-11-18 ENCOUNTER — Encounter: Payer: Self-pay | Admitting: Internal Medicine

## 2023-11-18 ENCOUNTER — Other Ambulatory Visit (HOSPITAL_BASED_OUTPATIENT_CLINIC_OR_DEPARTMENT_OTHER): Payer: Self-pay | Admitting: Obstetrics & Gynecology

## 2023-11-18 DIAGNOSIS — N952 Postmenopausal atrophic vaginitis: Secondary | ICD-10-CM

## 2023-12-02 ENCOUNTER — Ambulatory Visit: Payer: Managed Care, Other (non HMO) | Admitting: Internal Medicine

## 2023-12-02 VITALS — BP 110/80 | Ht 66.5 in

## 2023-12-02 DIAGNOSIS — J069 Acute upper respiratory infection, unspecified: Secondary | ICD-10-CM

## 2023-12-02 MED ORDER — AZITHROMYCIN 250 MG PO TABS: ORAL_TABLET | ORAL | 0 refills | Status: AC

## 2023-12-02 NOTE — Patient Instructions (Signed)
We are sorry you are not feeling well today.  Please take Zithromax Z-PAK 2 tabs day 1 followed by 1 tab days 2 through 5.  Rest and stay well-hydrated.  Contact us in a few days if symptoms are not improving or sooner if worse.

## 2023-12-02 NOTE — Progress Notes (Addendum)
Patient Care Team: [name], MD as PCP - General (Internal Medicine)  Visit Date: 12/02/23  Subjective:    Patient ID: Tara Stanley , Female   DOB: [birth year], 67 y.o.    MRN: [id number]   67 y.o. Female presents today for nasal congestion recently. Reports negative at-home Covid-19 test. History of cough during winter months.  Past Medical History:  Diagnosis Date   Arthritis    osteoarthritis   History of frequent urinary tract infections    on prophalaytic medication    Hypertension    Melanoma ([facility])    right upper arm   Osteomyelitis ([facility]) 1967   left leg   Osteopenia    Renal cyst    benign, found on CT scan, is being followed   Spinal stenosis    mild hx of herniated disc   Thyroid nodule     nodule, hypothyroidism pt was on synthyroid but is not having to take currently pt states her MD states she does not have issues with      Family History  Problem Relation Age of Onset   Diabetes Mother        borderline type II   Hypertension Mother    Arthritis Mother    Hypertension Father    Stroke Father    COPD Father    Hypertension Sister    Osteoarthritis Sister    Hypertension Brother    Osteoarthritis Brother    Hypertension Other        all grandparents   Stroke Maternal Grandmother    Osteoporosis Maternal Grandmother    Stroke Paternal Grandfather    Aneurysm Maternal Aunt 63   Colon cancer Neg Hx    Esophageal cancer Neg Hx    Stomach cancer Neg Hx    Rectal cancer Neg Hx    Thyroid disease Neg Hx     Social History   Social History Narrative   Not on file      Review of Systems  Constitutional:  Negative for fever and malaise/fatigue.  HENT:  Positive for congestion (Nasal).   Eyes:  Negative for blurred vision.  Respiratory:  Negative for cough and shortness of breath.   Cardiovascular:  Negative for chest pain, palpitations and leg swelling.  Gastrointestinal:  Negative for vomiting.  Musculoskeletal:  Negative for  back pain.  Skin:  Negative for rash.  Neurological:  Negative for loss of consciousness and headaches.        Objective:   Vitals: BP 110/80   Ht 5' 6.5" (1.689 m)   LMP 01/01/2008   BMI 25.28 kg/m    Physical Exam Vitals and nursing note reviewed.  Constitutional:      General: She is not in acute distress.    Appearance: Normal appearance. She is not toxic-appearing.  HENT:     Head: Normocephalic and atraumatic.     Right Ear: Hearing, tympanic membrane, ear canal and external ear normal.     Left Ear: Hearing, tympanic membrane, ear canal and external ear normal.     Mouth/Throat:     Pharynx: Oropharynx is clear. No oropharyngeal exudate.  Pulmonary:     Effort: Pulmonary effort is normal. No respiratory distress.     Breath sounds: Normal breath sounds. No wheezing or rales.  Skin:    General: Skin is warm and dry.  Neurological:     Mental Status: She is alert and oriented to person, place, and time. Mental status is at  baseline.  Psychiatric:        Mood and Affect: Mood normal.        Behavior: Behavior normal.        Thought Content: Thought content normal.        Judgment: Judgment normal.       Results:   Studies obtained and personally reviewed by me:   Labs:       Component Value Date/Time   NA 135 03/15/2015 0605   K 3.4 (L) 03/15/2015 0605   CL 96 03/15/2015 0605   CO2 33 (H) 03/15/2015 0605   GLUCOSE 117 (H) 03/15/2015 0605   BUN 12 03/15/2015 0605   CREATININE 0.54 03/15/2015 0605   CREATININE 0.64 01/26/2014 1255   CALCIUM 9.0 03/15/2015 0605   PROT 5.9 (L) 03/15/2015 0605   ALBUMIN 2.9 (L) 03/15/2015 0605   AST 20 03/15/2015 0605   ALT 21 03/15/2015 0605   ALKPHOS 48 03/15/2015 0605   BILITOT 0.8 03/15/2015 0605   GFRNONAA >90 03/15/2015 0605   GFRAA >90 03/15/2015 0605     Lab Results  Component Value Date   WBC 6.7 10/29/2023   HGB 13.8 10/29/2023   HCT 41.8 10/29/2023   MCV 94.4 10/29/2023   PLT 298 10/29/2023     No results found for: "CHOL", "HDL", "LDLCALC", "LDLDIRECT", "TRIG", "CHOLHDL"  No results found for: "HGBA1C"   Lab Results  Component Value Date   TSH 0.83 04/07/2018      Assessment & Plan:   Acute upper respiratory infection: prescribed Zithromax Z-Pak two tabs day 1 followed by one tab days 2-5. Get plenty of rest and stay well-hydrated. Contact us if symptoms worsen or fail to improve.    I,Alexander Ruley,acting as a Neurosurgeon for [name], MD.,have documented all relevant documentation on the behalf of [name], MD,as directed by  [name], MD while in the presence of [name], MD.   I, [name], MD, have reviewed all documentation for this visit. The documentation on 12/02/23 for the exam, diagnosis, procedures, and orders are all accurate and complete.

## 2024-01-06 ENCOUNTER — Ambulatory Visit (HOSPITAL_BASED_OUTPATIENT_CLINIC_OR_DEPARTMENT_OTHER): Payer: Managed Care, Other (non HMO) | Admitting: Obstetrics & Gynecology

## 2024-01-06 ENCOUNTER — Other Ambulatory Visit (HOSPITAL_COMMUNITY)
Admission: RE | Admit: 2024-01-06 | Discharge: 2024-01-06 | Disposition: A | Discharge status: Home / Self Care | Payer: Managed Care, Other (non HMO) | Source: Ambulatory Visit | Attending: Certified Nurse Midwife | Admitting: Certified Nurse Midwife

## 2024-01-06 ENCOUNTER — Encounter (HOSPITAL_BASED_OUTPATIENT_CLINIC_OR_DEPARTMENT_OTHER): Payer: Self-pay | Admitting: Obstetrics & Gynecology

## 2024-01-06 VITALS — BP 132/78 | HR 87 | Ht 65.5 in | Wt 156.8 lb

## 2024-01-06 DIAGNOSIS — N950 Postmenopausal bleeding: Secondary | ICD-10-CM | POA: Insufficient documentation

## 2024-01-06 DIAGNOSIS — N952 Postmenopausal atrophic vaginitis: Secondary | ICD-10-CM

## 2024-01-06 MED ORDER — ESTRING 7.5 MCG/24HR VA RING: 1.0000 | VAGINAL_RING | VAGINAL | 3 refills | Status: DC

## 2024-01-06 NOTE — Progress Notes (Signed)
 GYNECOLOGY  VISIT  CC:   PMP bleeding  HPI: 68 y.o. G2P2 [name] here for complaint for PMP bleeding that was red and lasted for about 4-5 days.  She has been using the Estring  and this was difficult to remove the last time.  She was very sore after it was removed.  She has been using this for years and never had something like this happen.  Also, received notification from insurance that estring  not covered.  Yuvafen, estradiol  and premarin (likely creams) covered.  Discussed administration.  Also, could consider getting Estring  from another, more cost effective, pharmacy.  She thinks she wants to do this. Will write rx for pt.  She is on this due to recurrent UTIs and does not want to start having these more frequently.  Did have two in 2024 even with the Estring  use.    Last pap 02/2021.     Past Medical History:  Diagnosis Date   Arthritis    osteoarthritis   History of frequent urinary tract infections    on prophalaytic medication    Hypertension    Melanoma (HCC)    right upper arm   Osteomyelitis (HCC) 1967   left leg   Osteopenia    Renal cyst    benign, found on CT scan, is being followed   Spinal stenosis    mild hx of herniated disc   Thyroid  nodule     nodule, hypothyroidism pt was on synthyroid but is not having to take currently pt states her MD states she does not have issues with     MEDS:   Current Outpatient Medications on File Prior to Visit  Medication Sig Dispense Refill   chlorthalidone  (HYGROTON ) 25 MG tablet Take 12.5 mg by mouth every morning.     Cholecalciferol (VITAMIN D3) 5000 units TABS Take 1 tablet by mouth daily.     losartan  (COZAAR ) 100 MG tablet TAKE 1 TABLET DAILY 30 tablet 0   Multiple Vitamins-Minerals (CENTRUM ULTRA WOMENS PO) Take 1 tablet by mouth daily.     raloxifene  (EVISTA ) 60 MG tablet TAKE 1 TABLET DAILY 90 tablet 3   No current facility-administered medications on file prior to visit.    ALLERGIES:  Hydrocodone  SH:  married, non smoker  Review of Systems  Constitutional: Negative.   Genitourinary:        PMP bleeding    PHYSICAL EXAMINATION:    BP 132/78   Pulse 87   Ht 5' 5.5 (1.664 m)   Wt 156 lb 12.8 oz (71.1 kg)   LMP 01/01/2008   BMI 25.70 kg/m     General appearance: alert, cooperative and appears stated age Lymph:  no inguinal LAD noted  Pelvic: External genitalia:  no lesions              Urethra:  normal appearing urethra with no masses, tenderness or lesions              Bartholins and Skenes: normal                 Vagina: atrophic tissue but no lesions              Cervix: no lesions              Bimanual Exam:  Uterus:  normal size, contour, position, consistency, mobility, non-tender              Adnexa: no mass, fullness, tenderness  Endometrial biopsy recommended.  Discussed with patient.  Verbal and written consent obtained.   Procedure:  Speculum placed.  Cervix visualized and cleansed with betadine prep.  A single toothed tenaculum was applied to the anterior lip of the cervix.  Endometrial pipelle was advanced through the cervix into the endometrial cavity without difficulty.  Pipelle passed to 6.5cm.  Suction applied and pipelle removed with thin tissue sample obtained.  Second pass performed to increase specimen amount.  Tenculum removed.  No bleeding noted.  Patient tolerated procedure well.  Chaperone, Bascom Kotyk, CMA, was present for exam.  Assessment/Plan: 1. Postmenopausal bleeding (Primary) - Surgical pathology( [place]/ [facility])  2. Vaginal atrophy - for now, she will continue with the estring  but may consider getting this from another pharmacy location - estradiol  (ESTRING ) 7.5 MCG/24HR vaginal ring; Place 1 each vaginally every 3 (three) months. follow package directions  Dispense: 1 each; Refill: 3

## 2024-01-08 LAB — SURGICAL PATHOLOGY

## 2024-01-14 ENCOUNTER — Other Ambulatory Visit (HOSPITAL_BASED_OUTPATIENT_CLINIC_OR_DEPARTMENT_OTHER): Payer: Self-pay | Admitting: *Deleted

## 2024-01-14 ENCOUNTER — Encounter (HOSPITAL_BASED_OUTPATIENT_CLINIC_OR_DEPARTMENT_OTHER): Payer: Self-pay | Admitting: Obstetrics & Gynecology

## 2024-01-14 DIAGNOSIS — N952 Postmenopausal atrophic vaginitis: Secondary | ICD-10-CM

## 2024-01-14 MED ORDER — ESTRING 7.5 MCG/24HR VA RING: 1.0000 | VAGINAL_RING | VAGINAL | 3 refills | Status: DC

## 2024-01-14 NOTE — Addendum Note (Signed)
 Addended by: Jerene Bears on: 01/14/2024 07:08 AM   Modules accepted: Orders

## 2024-01-14 NOTE — Progress Notes (Signed)
 Pt requests rx for estring be sent to pharmacy. Rx sent.

## 2024-01-22 ENCOUNTER — Encounter (HOSPITAL_BASED_OUTPATIENT_CLINIC_OR_DEPARTMENT_OTHER): Payer: Self-pay | Admitting: Obstetrics & Gynecology

## 2024-01-22 ENCOUNTER — Ambulatory Visit (HOSPITAL_BASED_OUTPATIENT_CLINIC_OR_DEPARTMENT_OTHER): Payer: Managed Care, Other (non HMO) | Admitting: Obstetrics & Gynecology

## 2024-01-22 ENCOUNTER — Ambulatory Visit (HOSPITAL_BASED_OUTPATIENT_CLINIC_OR_DEPARTMENT_OTHER): Payer: Managed Care, Other (non HMO)

## 2024-01-22 VITALS — BP 132/85 | HR 94 | Ht 66.0 in | Wt 157.2 lb

## 2024-01-22 DIAGNOSIS — N952 Postmenopausal atrophic vaginitis: Secondary | ICD-10-CM | POA: Diagnosis not present

## 2024-01-22 DIAGNOSIS — N950 Postmenopausal bleeding: Secondary | ICD-10-CM | POA: Diagnosis not present

## 2024-01-22 MED ORDER — ESTRADIOL 7.5 MCG/24HR VA RING: 1.0000 | VAGINAL_RING | VAGINAL | 4 refills | Status: DC

## 2024-01-25 NOTE — Progress Notes (Signed)
GYNECOLOGY  VISIT  CC:   Discuss ultrasound results, h/o PMP Bleeding, not on HRT  HPI: 68 y.o. G2P2 [name] here for discussion of ultrasound results.  Ultrasound obtained due to PMP bleeding.  Bleeding has stopped.  Endometrial biopsy on 01/06/2024 was negative.  She is using estring but not full HR which is being used for vaginal atrophy.  Would like new rx to [facility].  Does have some questions about ordering estring if insurance won't cover at local pharmacy.    Ultrasound showed normal uterus, endometrium of 1.99mm and normal/atrophic ovaries.  No other abnormalities noted.  Pt reassured.  As bleeding as stopped and she's not had any new symptoms, do not need additional evaluation is needed.     Past Medical History:  Diagnosis Date   Arthritis    osteoarthritis   History of frequent urinary tract infections    on prophalaytic medication    Hypertension    Melanoma (HCC)    right upper arm   Osteomyelitis (HCC) 1967   left leg   Osteopenia    Renal cyst    benign, found on CT scan, is being followed   Spinal stenosis    mild hx of herniated disc   Thyroid nodule     nodule, hypothyroidism pt was on synthyroid but is not having to take currently pt states her MD states she does not have issues with     MEDS:   Current Outpatient Medications on File Prior to Visit  Medication Sig Dispense Refill   chlorthalidone (HYGROTON) 25 MG tablet Take 12.5 mg by mouth every morning.     losartan (COZAAR) 100 MG tablet TAKE 1 TABLET DAILY 30 tablet 0   Multiple Vitamins-Minerals (CENTRUM ULTRA WOMENS PO) Take 1 tablet by mouth daily.     raloxifene (EVISTA) 60 MG tablet TAKE 1 TABLET DAILY 90 tablet 3   No current facility-administered medications on file prior to visit.    ALLERGIES: Hydrocodone  SH:  married, non smoker  Review of Systems  Constitutional: Negative.   Genitourinary: Negative.     PHYSICAL EXAMINATION:    BP 132/85 (BP Location:  Right Arm, Patient Position: Sitting, Cuff Size: Normal)   Pulse 94   Ht 5\' 6"  (1.676 m) Comment: Reported  Wt 157 lb 3.2 oz (71.3 kg)   LMP 01/01/2008   BMI 25.37 kg/m      Physical Exam Constitutional:      Appearance: Normal appearance.  Neurological:     General: No focal deficit present.  Psychiatric:        Mood and Affect: Mood normal.    Assessment/Plan: 1. Postmenopausal bleeding (Primary) - this has resolved.  Evaluation with endometrial biopy and ultrasound negative and reassuring.  Do not feel any additional evaluation needed. - advised to call if has any more.  Will watch/wait for now.    2. Vaginal atrophy - RF for estring to local pharmacy - estradiol (ESTRING) 7.5 MCG/24HR vaginal ring; Place 1 each vaginally every 3 (three) months.  Dispense: 1 each; Refill: 4

## 2024-02-03 ENCOUNTER — Encounter: Payer: Self-pay | Admitting: Internal Medicine

## 2024-02-03 NOTE — Progress Notes (Signed)
 Patient Care Team: Perri Ronal PARAS, MD as PCP - General (Internal Medicine)  Visit Date: 02/04/24  Subjective:    Patient PI:Tara Stanley, Tara Stanley DOB:04-Aug-1956,68 y.o. FMW:[id number]   68 y.o. Female presents today for acute visit with Travel Advice. Reports she and her husband have upcoming overseas travel plans and would like to have Paxlovid  available for travel.  Patient has had Covid-19 several times. Last seen here for Covid-19 Sept 2023.  Patient is followed at [facility] and also has yearly physicals at [facility]. She has HTN well controlled on losartan  and chlorthalidone .. GYN, Dr. Cleotilde has her on Evista . She also has an estrogen vaginal ring.  Past Medical History:  Diagnosis Date   Arthritis    osteoarthritis   History of frequent urinary tract infections    on prophalaytic medication    Hypertension    Melanoma (HCC)    right upper arm   Osteomyelitis (HCC) 1967   left leg   Osteopenia    Renal cyst    benign, found on CT scan, is being followed   Spinal stenosis    mild hx of herniated disc   Thyroid  nodule     nodule, hypothyroidism pt was on synthyroid but is not having to take currently pt states her MD states she does not have issues with     Allergies  Allergen Reactions   Hydrocodone Other (See Comments)    Makes me goofy    Family History  Problem Relation Age of Onset   Diabetes Mother        borderline type II   Hypertension Mother    Arthritis Mother    Hypertension Father    Stroke Father    COPD Father    Hypertension Sister    Osteoarthritis Sister    Hypertension Brother    Osteoarthritis Brother    Hypertension Other        all grandparents   Stroke Maternal Grandmother    Osteoporosis Maternal Grandmother    Stroke Paternal Grandfather    Aneurysm Maternal Aunt 63   Colon cancer Neg Hx    Esophageal cancer Neg Hx    Stomach cancer Neg Hx    Rectal cancer Neg Hx    Thyroid  disease Neg Hx     Social Hx: Married. Has adult children. Nonsmoker. Social alcohol consumption  ROS   Objective:  Vitals: BP 120/80   Pulse 76   Ht 5' 6 (1.676 m)   Wt 159 lb (72.1 kg)   LMP 01/01/2008   SpO2 97%   BMI 25.66 kg/m   Physical Exam  Skin: warm and dry. Chest is cleat. No thyromegaly. Cor: RRR. No LE edema. Results:  Studies Obtained And Personally Reviewed By Me:  Imaging, colonoscopy, mammogram, bone density scan, echocardiogram, heart cath, stress test, CT calcium score, etc.   No recent studies Labs:     Component Value Date/Time   NA 135 03/15/2015 0605   K 3.4 (L) 03/15/2015 0605   CL 96 03/15/2015 0605   CO2 33 (H) 03/15/2015 0605   GLUCOSE 117 (H) 03/15/2015 0605   BUN 12 03/15/2015 0605   CREATININE 0.54 03/15/2015 0605   CREATININE 0.64 01/26/2014 1255   CALCIUM 9.0 03/15/2015 0605   PROT 5.9 (L) 03/15/2015 0605   ALBUMIN 2.9 (L) 03/15/2015 0605   AST 20 03/15/2015 0605   ALT 21 03/15/2015 0605   ALKPHOS 48 03/15/2015 0605   BILITOT 0.8 03/15/2015 9394  GFRNONAA >90 03/15/2015 0605   GFRAA >90 03/15/2015 0605    Lab Results  Component Value Date   WBC 6.7 10/29/2023   HGB 13.8 10/29/2023   HCT 41.8 10/29/2023   MCV 94.4 10/29/2023   PLT 298 10/29/2023   Lab Results  Component Value Date   TSH 0.83 04/07/2018    Assessment & Plan:  Travel advice encounter. Patient requests Paxlovid  for upcoming trip to [place].  eGFR 97. Sending in regular strength Paxlovid .  HTN well controlled on current regimen  Estrogen replacement prescribed by GYN, Dr. Cleotilde  Hx of PMR- seen at [facility] in November 2024 for recurrent symptoms. Rheumatologist thought patient possibly had trochanteric bursitis. Had normal ESR in October 2024. CK,CCP, ANA, RF were also all negative at the time. I prescribed a 6 day 4mg  Medrol  dose pack at the time  and she improved.    I,Emily Lagle,acting as a neurosurgeon for Ronal JINNY Hailstone, MD.,have documented all relevant  documentation on the behalf of Ronal JINNY Hailstone, MD,as directed by  Ronal JINNY Hailstone, MD while in the presence of Ronal JINNY Hailstone, MD.   I, Ronal JINNY Hailstone, MD, have reviewed all documentation for this visit. The documentation on 02/16/24 for the exam, diagnosis, procedures, and orders are all accurate and complete.  Time spent with office visit, reviewing medical records including those from [facility] and  [facility] is 20 min.

## 2024-02-04 ENCOUNTER — Ambulatory Visit: Payer: Managed Care, Other (non HMO) | Admitting: Internal Medicine

## 2024-02-04 ENCOUNTER — Encounter: Payer: Self-pay | Admitting: Internal Medicine

## 2024-02-04 VITALS — BP 120/80 | HR 76 | Ht 66.0 in | Wt 159.0 lb

## 2024-02-04 DIAGNOSIS — Z7184 Encounter for health counseling related to travel: Secondary | ICD-10-CM

## 2024-02-04 MED ORDER — NIRMATRELVIR/RITONAVIR (PAXLOVID)TABLET: 3.0000 | ORAL_TABLET | Freq: Two times a day (BID) | ORAL | 0 refills | Status: AC

## 2024-02-16 ENCOUNTER — Encounter: Payer: Self-pay | Admitting: Internal Medicine

## 2024-02-16 NOTE — Patient Instructions (Signed)
Regular strength Paxlovid prescribed for overseas travel should pt develop Covid 19 while traveling.

## 2024-02-27 ENCOUNTER — Ambulatory Visit: Payer: Managed Care, Other (non HMO) | Admitting: Internal Medicine

## 2024-03-16 ENCOUNTER — Ambulatory Visit (HOSPITAL_BASED_OUTPATIENT_CLINIC_OR_DEPARTMENT_OTHER): Payer: Self-pay | Admitting: Obstetrics & Gynecology

## 2024-04-06 ENCOUNTER — Ambulatory Visit (HOSPITAL_BASED_OUTPATIENT_CLINIC_OR_DEPARTMENT_OTHER): Payer: Managed Care, Other (non HMO) | Admitting: Obstetrics & Gynecology

## 2024-04-16 ENCOUNTER — Telehealth: Admitting: Physician Assistant

## 2024-04-16 ENCOUNTER — Ambulatory Visit: Payer: Self-pay

## 2024-04-16 DIAGNOSIS — J208 Acute bronchitis due to other specified organisms: Secondary | ICD-10-CM | POA: Diagnosis not present

## 2024-04-16 MED ORDER — BENZONATATE 100 MG PO CAPS: 100.0000 mg | ORAL_CAPSULE | Freq: Three times a day (TID) | ORAL | 0 refills | Status: DC | PRN

## 2024-04-16 MED ORDER — AZITHROMYCIN 250 MG PO TABS: ORAL_TABLET | ORAL | 0 refills | Status: AC

## 2024-04-16 MED ORDER — ALBUTEROL SULFATE HFA 108 (90 BASE) MCG/ACT IN AERS: 2.0000 | INHALATION_SPRAY | Freq: Four times a day (QID) | RESPIRATORY_TRACT | 0 refills | Status: DC | PRN

## 2024-04-16 NOTE — Progress Notes (Signed)
 Virtual Visit Consent   Tara Stanley, you are scheduled for a virtual visit with a [facility] provider today. Just as with appointments in the office, your consent must be obtained to participate. Your consent will be active for this visit and any virtual visit you may have with one of our providers in the next 365 days. If you have a MyChart account, a copy of this consent can be sent to you electronically.  As this is a virtual visit, video technology does not allow for your provider to perform a traditional examination. This may limit your provider's ability to fully assess your condition. If your provider identifies any concerns that need to be evaluated in person or the need to arrange testing (such as labs, EKG, etc.), we will make arrangements to do so. Although advances in technology are sophisticated, we cannot ensure that it will always work on either your end or our end. If the connection with a video visit is poor, the visit may have to be switched to a telephone visit. With either a video or telephone visit, we are not always able to ensure that we have a secure connection.  By engaging in this virtual visit, you consent to the provision of healthcare and authorize for your insurance to be billed (if applicable) for the services provided during this visit. Depending on your insurance coverage, you may receive a charge related to this service.  I need to obtain your verbal consent now. Are you willing to proceed with your visit today? Tara Stanley has provided verbal consent on 04/16/2024 for a virtual visit (video or telephone). Tara Stanley, [place]  Date: 04/16/2024 10:39 AM   Virtual Visit via Video Note   I, Tara Stanley, connected with  Tara Stanley  ([id number], 03-21-1956) on 04/16/24 at 10:45 AM EDT by a video-enabled telemedicine application and verified that I am speaking with the correct person using two identifiers.  Location: Patient:  Virtual Visit Location Patient: Home Provider: Virtual Visit Location Provider: Home Office   I discussed the limitations of evaluation and management by telemedicine and the availability of in person appointments. The patient expressed understanding and agreed to proceed.    History of Present Illness: Tara Stanley is a 68 y.o. who identifies as a female who was assigned female at birth, and is being seen today for URI symptoms starting this Monday. Initially with nasal congestion and mild cough. Notes this has progressively worsened since onset with increased chest congestion and cough that is now productive of thick yellow discharge with thick nasal discharge. Notes cough is worse at night time. Denies sinus pain and tooth pain. Denies fever, chills. Denies typical seasonal allergies.    OTC -- Dayquil/Nyquil.  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   Hepatic cyst 08/19/2011   Essential hypertension 08/19/2007   Malignant melanoma (HCC) 08/19/2007   Arthritis of knee, degenerative 11/25/1999    Allergies:  Allergies  Allergen Reactions   Hydrocodone Other (See Comments)    Makes me goofy   Medications:  Current Outpatient Medications:    albuterol (VENTOLIN HFA) 108 (90 Base) MCG/ACT inhaler, Inhale 2 puffs into the lungs every 6 (six) hours  as needed for wheezing or shortness of breath., Disp: 8 g, Rfl: 0   benzonatate (TESSALON) 100 MG capsule, Take 1 capsule (100 mg total) by mouth 3 (three) times daily as needed for cough., Disp: 30 capsule, Rfl: 0   chlorthalidone (HYGROTON) 25 MG tablet, Take 12.5 mg by mouth every morning., Disp: , Rfl:    estradiol (ESTRING) 7.5  MCG/24HR vaginal ring, Place 1 each vaginally every 3 (three) months., Disp: 1 each, Rfl: 4   losartan (COZAAR) 100 MG tablet, TAKE 1 TABLET DAILY, Disp: 30 tablet, Rfl: 0   Multiple Vitamins-Minerals (CENTRUM ULTRA WOMENS PO), Take 1 tablet by mouth daily., Disp: , Rfl:    raloxifene (EVISTA) 60 MG tablet, TAKE 1 TABLET DAILY, Disp: 90 tablet, Rfl: 3  Observations/Objective: Patient is well-developed, well-nourished in no acute distress.  Resting comfortably at home.  Head is normocephalic, atraumatic.  No labored breathing. Speech is clear and coherent with logical content.  Patient is alert and oriented at baseline.   Assessment and Plan: 1. Viral bronchitis (Primary) - benzonatate (TESSALON) 100 MG capsule; Take 1 capsule (100 mg total) by mouth 3 (three) times daily as needed for cough.  Dispense: 30 capsule; Refill: 0 - albuterol (VENTOLIN HFA) 108 (90 Base) MCG/ACT inhaler; Inhale 2 puffs into the lungs every 6 (six) hours as needed for wheezing or shortness of breath.  Dispense: 8 g; Refill: 0  Supportive measures and OTC medications reviewed. Tessalon and albuterol per orders for bronchospasm. Giving concerns and upcoming holiday -- did place script for Azithromycin on file at pharmacy for any acutely worsening symptoms, new onset fever or change in sputum that would raise concern for bacterial etiology.   Follow Up Instructions: I discussed the assessment and treatment plan with the patient. The patient was provided an opportunity to ask questions and all were answered. The patient agreed with the plan and demonstrated an understanding of the instructions.  A copy of instructions were sent to the patient via MyChart unless otherwise noted below.   The patient was advised to call back or seek an in-person evaluation if the symptoms worsen or if the condition fails to improve as anticipated.    Tara Maillard, PA-C

## 2024-04-16 NOTE — Patient Instructions (Signed)
 Tara Stanley, thank you for joining Hyla Maillard, PA-C for today's virtual visit.  While this provider is not your primary care provider (PCP), if your PCP is located in our provider database this encounter information will be shared with them immediately following your visit.   A [facility] MyChart account gives you access to today's visit and all your visits, tests, and labs performed at [facility] " click here if you don't have a [facility] MyChart account or go to [email or web address]  Consent: (Patient) Tara Stanley provided verbal consent for this virtual visit at the beginning of the encounter.  Current Medications:  Current Outpatient Medications:    albuterol (VENTOLIN HFA) 108 (90 Base) MCG/ACT inhaler, Inhale 2 puffs into the lungs every 6 (six) hours as needed for wheezing or shortness of breath., Disp: 8 g, Rfl: 0   benzonatate (TESSALON) 100 MG capsule, Take 1 capsule (100 mg total) by mouth 3 (three) times daily as needed for cough., Disp: 30 capsule, Rfl: 0   chlorthalidone (HYGROTON) 25 MG tablet, Take 12.5 mg by mouth every morning., Disp: , Rfl:    estradiol (ESTRING) 7.5 MCG/24HR vaginal ring, Place 1 each vaginally every 3 (three) months., Disp: 1 each, Rfl: 4   losartan (COZAAR) 100 MG tablet, TAKE 1 TABLET DAILY, Disp: 30 tablet, Rfl: 0   Multiple Vitamins-Minerals (CENTRUM ULTRA WOMENS PO), Take 1 tablet by mouth daily., Disp: , Rfl:    raloxifene (EVISTA) 60 MG tablet, TAKE 1 TABLET DAILY, Disp: 90 tablet, Rfl: 3   Medications ordered in this encounter:  Meds ordered this encounter  Medications   benzonatate (TESSALON) 100 MG capsule    Sig: Take 1 capsule (100 mg total) by mouth 3 (three) times daily as needed for cough.    Dispense:  30 capsule    Refill:  0    Supervising Provider:   LAMPTEY, PHILIP O [[id number]]   albuterol (VENTOLIN HFA) 108 (90 Base) MCG/ACT inhaler    Sig: Inhale 2 puffs into the lungs every 6 (six)  hours as needed for wheezing or shortness of breath.    Dispense:  8 g    Refill:  0    Supervising Provider:   Corine Dice [[id number]]     *If you need refills on other medications prior to your next appointment, please contact your pharmacy*  Follow-Up: Call back or seek an in-person evaluation if the symptoms worsen or if the condition fails to improve as anticipated.  [facility] [phone]  Other Instructions Upper Respiratory Infection, Adult An upper respiratory infection (URI) affects the nose, throat, and upper airways that lead to the lungs. The most common type of URI is often called the common cold. URIs usually get better on their own, without medical treatment. What are the causes? A URI is caused by a germ (virus). You may catch these germs by: Breathing in droplets from an infected person's cough or sneeze. Touching something that has the germ on it (is contaminated) and then touching your mouth, nose, or eyes. What increases the risk? You are more likely to get a URI if: You are very young or very old. You have close contact with others, such as at work, school, or a health care facility. You smoke. You have long-term (chronic) heart or lung disease. You have a weakened disease-fighting system (immune system). You have nasal allergies or asthma. You have a lot of stress. You have poor nutrition. What are the  signs or symptoms? Runny or stuffy (congested) nose. Cough. Sneezing. Sore throat. Headache. Feeling tired (fatigue). Fever. Not wanting to eat as much as usual. Pain in your forehead, behind your eyes, and over your cheekbones (sinus pain). Muscle aches. Redness or irritation of the eyes. Pressure in the ears or face. How is this treated? URIs usually get better on their own within 7-10 days. Medicines cannot cure URIs, but your doctor may recommend certain medicines to help relieve symptoms, such as: Over-the-counter cold  medicines. Medicines to reduce coughing (cough suppressants). Coughing is a type of defense against infection that helps to clear the nose, throat, windpipe, and lungs (respiratory system). Take these medicines only as told by your doctor. Medicines to lower your fever. Follow these instructions at home: Activity Rest as needed. If you have a fever, stay home from work or school until your fever is gone, or until your doctor says you may return to work or school. You should stay home until you cannot spread the infection anymore (you are not contagious). Your doctor may have you wear a face mask so you have less risk of spreading the infection. Relieving symptoms Rinse your mouth often with salt water. To make salt water, dissolve -1 tsp (3-6 g) of salt in 1 cup (237 mL) of warm water. Use a cool-mist humidifier to add moisture to the air. This can help you breathe more easily. Eating and drinking  Drink enough fluid to keep your pee (urine) pale yellow. Eat soups and other clear broths. General instructions  Take over-the-counter and prescription medicines only as told by your doctor. Do not smoke or use any products that contain nicotine or tobacco. If you need help quitting, ask your doctor. Avoid being where people are smoking (avoid secondhand smoke). Stay up to date on all your shots (immunizations), and get the flu shot every year. Keep all follow-up visits. How to prevent the spread of infection to others  Wash your hands with soap and water for at least 20 seconds. If you cannot use soap and water, use hand sanitizer. Avoid touching your mouth, face, eyes, or nose. Cough or sneeze into a tissue or your sleeve or elbow. Do not cough or sneeze into your hand or into the air. Contact a doctor if: You are getting worse, not better. You have any of these: A fever or chills. Brown or red mucus in your nose. Yellow or brown fluid (discharge)coming from your nose. Pain in your  face, especially when you bend forward. Swollen neck glands. Pain when you swallow. White areas in the back of your throat. Get help right away if: You have shortness of breath that gets worse. You have very bad or constant: Headache. Ear pain. Pain in your forehead, behind your eyes, and over your cheekbones (sinus pain). Chest pain. You have long-lasting (chronic) lung disease along with any of these: Making high-pitched whistling sounds when you breathe, most often when you breathe out (wheezing). Long-lasting cough (more than 14 days). Coughing up blood. A change in your usual mucus. You have a stiff neck. You have changes in your: Vision. Hearing. Thinking. Mood. These symptoms may be an emergency. Get help right away. Call 911. Do not wait to see if the symptoms will go away. Do not drive yourself to the hospital. Summary An upper respiratory infection (URI) is caused by a germ (virus). The most common type of URI is often called the common cold. URIs usually get better within 7-10 days. Take  over-the-counter and prescription medicines only as told by your doctor. This information is not intended to replace advice given to you by your health care provider. Make sure you discuss any questions you have with your health care provider. Document Revised: 07/19/2021 Document Reviewed: 07/19/2021 Elsevier Patient Education  2024 Elsevier Inc.   If you have been instructed to have an in-person evaluation today at a local Urgent Care facility, please use the link below. It will take you to a list of all of our available [facility], including address, phone number and hours of operation. Please do not delay care.  [facility]  If you or a family member do not have a primary care provider, use the link below to schedule a visit and establish care. When you choose a [facility] primary care physician or advanced practice provider, you gain a long-term  partner in health. Find a Primary Care Provider  Learn more about [facility]'s in-office and virtual care options: [facility] - Get Care Now

## 2024-04-16 NOTE — Telephone Encounter (Signed)
   Chief Complaint: cough Symptoms: cough, congestion, yellow phlegm Frequency: Monday, worsening Pertinent Negatives: Patient denies fever, sob Disposition: [] ED /[] Urgent Care (no appt availability in office) / [] Appointment(In office/virtual)/ [x]  [facility]/ [] Home Care/ [] Refused Recommended Disposition /[] [facility]/ []  Follow-up with PCP Additional Notes: Patient reports she has been experiencing worsening cough, congestion and yellow phlegm since Monday. Patient describes cough as severe and says it is keeping her up at night. Attempted to schedule in office, no availability until 4/28, so virtual visit scheduled today 04/16/24. Patient advised to call back with worsening symptoms. Patient verbalized understanding.   Copied from CRM [id number]. Topic: Appointments - Appointment Scheduling >> Apr 16, 2024  9:09 AM Emylou G wrote: Cold symptons: chest congestion, cough ( dry cough ) hard nights [phone] Reason for Disposition  SEVERE coughing spells (e.g., whooping sound after coughing, vomiting after coughing)  Answer Assessment - Initial Assessment Questions 1. ONSET: "When did the cough begin?"      monday 2. SEVERITY: "How bad is the cough today?"      severe 3. SPUTUM: "Describe the color of your sputum" (none, dry cough; clear, white, yellow, green)     yellow 4. HEMOPTYSIS: "Are you coughing up any blood?" If so ask: "How much?" (flecks, streaks, tablespoons, etc.)     none 5. DIFFICULTY BREATHING: "Are you having difficulty breathing?" If Yes, ask: "How bad is it?" (e.g., mild, moderate, severe)    - MILD: No SOB at rest, mild SOB with walking, speaks normally in sentences, can lie down, no retractions, pulse < 100.    - MODERATE: SOB at rest, SOB with minimal exertion and prefers to sit, cannot lie down flat, speaks in phrases, mild retractions, audible wheezing, pulse 100-120.    - SEVERE: Very SOB at rest, speaks in single words, struggling to  breathe, sitting hunched forward, retractions, pulse > 120      denies 6. FEVER: "Do you have a fever?" If Yes, ask: "What is your temperature, how was it measured, and when did it start?"     denies 7. CARDIAC HISTORY: "Do you have any history of heart disease?" (e.g., heart attack, congestive heart failure)      denies 8. LUNG HISTORY: "Do you have any history of lung disease?"  (e.g., pulmonary embolus, asthma, emphysema)     denies 9. PE RISK FACTORS: "Do you have a history of blood clots?" (or: recent major surgery, recent prolonged travel, bedridden)     denies 10. OTHER SYMPTOMS: "Do you have any other symptoms?" (e.g., runny nose, wheezing, chest pain)      Yellow phlegm, congestion  Protocols used: Cough - Acute Productive-A-AH

## 2024-04-20 ENCOUNTER — Other Ambulatory Visit (HOSPITAL_BASED_OUTPATIENT_CLINIC_OR_DEPARTMENT_OTHER): Payer: Self-pay | Admitting: *Deleted

## 2024-04-20 MED ORDER — RALOXIFENE HCL 60 MG PO TABS: 60.0000 mg | ORAL_TABLET | Freq: Every day | ORAL | 3 refills | Status: AC

## 2024-04-20 NOTE — Progress Notes (Signed)
 Pt requests [facility]ill on [facility] Pt has appt in June. Refill sent

## 2024-06-16 ENCOUNTER — Encounter (HOSPITAL_BASED_OUTPATIENT_CLINIC_OR_DEPARTMENT_OTHER): Payer: Self-pay | Admitting: Obstetrics & Gynecology

## 2024-06-16 ENCOUNTER — Other Ambulatory Visit (HOSPITAL_COMMUNITY)
Admission: RE | Admit: 2024-06-16 | Discharge: 2024-06-16 | Disposition: A | Discharge status: Home / Self Care | Source: Ambulatory Visit | Attending: Obstetrics & Gynecology | Admitting: Obstetrics & Gynecology

## 2024-06-16 ENCOUNTER — Ambulatory Visit (INDEPENDENT_AMBULATORY_CARE_PROVIDER_SITE_OTHER): Admitting: Obstetrics & Gynecology

## 2024-06-16 VITALS — BP 120/80 | HR 79 | Ht 65.25 in | Wt 150.6 lb

## 2024-06-16 DIAGNOSIS — Z9189 Other specified personal risk factors, not elsewhere classified: Secondary | ICD-10-CM | POA: Diagnosis not present

## 2024-06-16 DIAGNOSIS — Z124 Encounter for screening for malignant neoplasm of cervix: Secondary | ICD-10-CM | POA: Diagnosis present

## 2024-06-16 DIAGNOSIS — Z01419 Encounter for gynecological examination (general) (routine) without abnormal findings: Secondary | ICD-10-CM

## 2024-06-16 DIAGNOSIS — Z1211 Encounter for screening for malignant neoplasm of colon: Secondary | ICD-10-CM

## 2024-06-16 DIAGNOSIS — N952 Postmenopausal atrophic vaginitis: Secondary | ICD-10-CM

## 2024-06-16 MED ORDER — ESTRADIOL 7.5 MCG/24HR VA RING
1.0000 | VAGINAL_RING | VAGINAL | 4 refills | Status: AC
Start: 2024-06-16 — End: ?

## 2024-06-16 NOTE — Progress Notes (Signed)
 ANNUAL EXAM Patient name: Tara Stanley MRN [id number]  Date of birth: [birth year] Chief Complaint:   Breast and Pelvic  History of Present Illness:   Tara Stanley is a 68 y.o. G2P2 Caucasian female being seen today for a routine annual exam.  Denies vaginal bleeding.  Had some bursitis in her hip last year.  Had some injections.  Not using estring  due to cost.  Had some bleeding with last episode of intercourse.  Discussed where to obtained estring  with lower cost.  Had ultrasound in January with small ovaries and thin endometrium.  I don't feel strongly about repeating this.  She is having some hip pain as well.  Has seen ortho.    Patient's last menstrual period was 01/01/2008.  Last pap 03/10/2021. Results were: NILM w/ HRHPV not done. H/O abnormal pap: no Last mammogram: 10/22/2023. Results were: normal. Family h/o breast cancer: no Last colonoscopy: 03/19/2012. Results were: normal. Family h/o colorectal cancer: no DEXA:  11/2021     06/16/2024   10:01 AM 01/22/2024    2:07 PM 02/28/2023    4:00 PM 03/15/2022    3:21 PM 02/24/2021   11:16 AM  Depression screen PHQ 2/9  Decreased Interest 0 0 0 0 0  Down, Depressed, Hopeless 0 0  0 0  PHQ - 2 Score 0 0 0 0 0    Review of Systems:   Pertinent items are noted in HPI Denies urinary or bowel changes.   Pertinent History Reviewed:  Reviewed past medical,surgical, social and family history.  Reviewed problem list, medications and allergies. Physical Assessment:   Vitals:   06/16/24 0954 06/16/24 1015  BP: (!) 148/81 120/80  Pulse: 79   Weight: 150 lb 9.6 oz (68.3 kg)   Height: 5' 5.25 (1.657 m)   Body mass index is 24.87 kg/m.        Physical Examination:   General appearance - well appearing, and in no distress  Mental status - alert, oriented to person, place, and time  Psych:  She has a normal mood and affect  Skin - warm and dry, normal color, no suspicious lesions noted  Chest - effort normal, all  lung fields clear to auscultation bilaterally  Heart - normal rate and regular rhythm  Neck:  midline trachea, no thyromegaly or nodules  Breasts - breasts appear normal, no suspicious masses, no skin or nipple changes or  axillary nodes  Abdomen - soft, nontender, nondistended, no masses or organomegaly  Pelvic - VULVA: normal appearing vulva with no masses, tenderness or lesions   VAGINA: atrophic changes CERVIX: normal appearing cervix without discharge or lesions, no CMT  Thin prep pap is   UTERUS: uterus is felt to be normal size, shape, consistency and nontender   ADNEXA: No adnexal masses or tenderness noted.  Rectal - normal rectal, good sphincter tone, no masses felt  Extremities:  No swelling or varicosities noted  Chaperone present for exam  Assessment & Plan:  1. Well woman exam with routine gynecological exam (Primary) - Pap smear updated today - Mammogram 10/2023 - Colonoscopy 2013.  Follow up due.  Pt aware. - Bone mineral density 2022 at [facility].  She is going to update this year.   - lab work done with PCP, Dr. Liane Redman - vaccines reviewed/updated  2. Colon cancer screening - Cologuard  3. Vaginal atrophy - estradiol  (ESTRING ) 7.5 MCG/24HR vaginal ring; Place 1 each vaginally every 3 (three) months.  Dispense: 1 each; Refill: 4  4. High risk for hip fracture - on Raloxifene  60mg  daily.  Does not need RF as was updated in April.    Orders Placed This Encounter  Procedures   Cologuard    Meds:  Meds ordered this encounter  Medications   estradiol  (ESTRING ) 7.5 MCG/24HR vaginal ring    Sig: Place 1 each vaginally every 3 (three) months.    Dispense:  1 each    Refill:  4    Follow-up: Return in about 1 year (around 06/16/2025).  Lillian Rein, MD 06/16/2024 10:35 AM

## 2024-06-17 ENCOUNTER — Ambulatory Visit (HOSPITAL_BASED_OUTPATIENT_CLINIC_OR_DEPARTMENT_OTHER): Payer: Self-pay | Admitting: Obstetrics & Gynecology

## 2024-06-17 LAB — CYTOLOGY - PAP: Diagnosis: NEGATIVE

## 2024-09-10 ENCOUNTER — Telehealth: Payer: Self-pay | Admitting: Internal Medicine

## 2024-09-11 ENCOUNTER — Ambulatory Visit: Payer: Self-pay

## 2024-09-11 NOTE — Telephone Encounter (Signed)
 FYI Only or Action Required?: FYI only for provider.  Patient was last seen in primary care on 04/16/2024 by Gladis Elsie BROCKS, PA-C.  Called Nurse Triage reporting Dizziness.  Symptoms began a week ago.  Interventions attempted: Rest, hydration, or home remedies.  Symptoms are: unchanged.  Triage Disposition: See PCP When Office is Open (Within 3 Days)  Patient/caregiver understands and will follow disposition?: Yes       Copied from CRM #[id number]. Topic: Clinical - Red Word Triage >> Sep 11, 2024  1:32 PM Winona R wrote: Pt is experiencing dizzy spells. States it has happened before and medication was adjusted        Reason for Disposition  [1] MILD dizziness (e.g., walking normally) AND [2] has NOT been evaluated by doctor (or NP/PA) for this  (Exception: Dizziness caused by heat exposure, sudden standing, or poor fluid intake.)  Answer Assessment - Initial Assessment Questions 1. DESCRIPTION: Describe your dizziness.     Some dizziness when going from laying down to standing up 2. LIGHTHEADED: Do you feel lightheaded? (e.g., somewhat faint, woozy, weak upon standing)     Yes 3. VERTIGO: Do you feel like either you or the room is spinning or tilting? (i.e., vertigo)     No 4. SEVERITY: How bad is it?  Do you feel like you are going to faint? Can you stand and walk?     Mild  5. ONSET:  When did the dizziness begin?     1 week 6. AGGRAVATING FACTORS: Does anything make it worse? (e.g., standing, change in head position)     Standing up form laying down gets mildly dizzy  7. HEART RATE: Can you tell me your heart rate? How many beats in 15 seconds?  (Note: Not all patients can do this.)       N/A 8. CAUSE: What do you think is causing the dizziness? (e.g., decreased fluids or food, diarrhea, emotional distress, heat exposure, new medicine, sudden standing, vomiting; unknown)     Unsure if due to blood pressure medication  9. RECURRENT SYMPTOM:  Have you had dizziness before? If Yes, ask: When was the last time? What happened that time?     Yes, required blood pressure medication adjustment  10. OTHER SYMPTOMS: Do you have any other symptoms? (e.g., fever, chest pain, vomiting, diarrhea, bleeding)       No  Protocols used: Dizziness - Lightheadedness-A-AH

## 2024-09-14 ENCOUNTER — Ambulatory Visit: Admitting: Internal Medicine

## 2024-09-14 VITALS — BP 120/90 | HR 96 | Ht 65.25 in | Wt 151.0 lb

## 2024-09-14 DIAGNOSIS — I10 Essential (primary) hypertension: Secondary | ICD-10-CM | POA: Diagnosis not present

## 2024-09-14 DIAGNOSIS — H8112 Benign paroxysmal vertigo, left ear: Secondary | ICD-10-CM | POA: Diagnosis not present

## 2024-09-14 DIAGNOSIS — Z8739 Personal history of other diseases of the musculoskeletal system and connective tissue: Secondary | ICD-10-CM | POA: Diagnosis not present

## 2024-09-14 MED ORDER — LOSARTAN POTASSIUM 50 MG PO TABS
50.0000 mg | ORAL_TABLET | Freq: Every day | ORAL | 0 refills | Status: DC
Start: 2024-09-14 — End: 2024-10-07

## 2024-09-14 NOTE — Progress Notes (Shared)
 Patient Care Team: Perri Ronal PARAS, MD as PCP - General (Internal Medicine)  Visit Date: 09/14/24  Subjective:    Patient ID: Tara Stanley , Female   DOB: 07-Oct-1956, 68 y.o.    MRN: [id number]   68 y.o. Female presents today for acute visit for dizziness. Patient has a past medical history of Hypertension, Right hip pain, Osteopenia.    She says that recently she has been getting light headed. She says it happens when she sits down and stands up but denies it happening when she is walking. She stopped taking chlorthalidone  25 mg daily to see if it was the cause of her lightheadedness but started taking it again when her blood pressure rose. A physical exam revealed a leftward nystagmus. She says the mornings is when she feels the most light headed. She has not been working out as much due to the light headedness but she does say that she is well hydrated.     Her Husband is having knee surgery tomorrow and she will be his primary care giver as he is healing.   History of Hypertension treated with chlorthalidone  25 mg daily and losartan  100 mg daily.   History of Right hip pain treated by meloxicam 15 mg as needed for pain.    Past Medical History:  Diagnosis Date   Arthritis    osteoarthritis   History of frequent urinary tract infections    on prophalaytic medication    Hypertension    Melanoma (HCC)    right upper arm   Osteomyelitis (HCC) 1967   left leg   Osteopenia    Renal cyst    benign, found on CT scan, is being followed   Spinal stenosis    mild hx of herniated disc   Thyroid  nodule     nodule, hypothyroidism pt was on synthyroid but is not having to take currently pt states her MD states she does not have issues with      Family History  Problem Relation Age of Onset   Diabetes Mother        borderline type II   Hypertension Mother    Arthritis Mother    Hypertension Father    Stroke Father    COPD Father    Hypertension Sister     Osteoarthritis Sister    Hypertension Brother    Osteoarthritis Brother    Hypertension Other        all grandparents   Stroke Maternal Grandmother    Osteoporosis Maternal Grandmother    Stroke Paternal Grandfather    Aneurysm Maternal Aunt 63   Colon cancer Neg Hx    Esophageal cancer Neg Hx    Stomach cancer Neg Hx    Rectal cancer Neg Hx    Thyroid  disease Neg Hx     Social History   Social History Narrative   Not on file      Review of Systems  Neurological:  Positive for dizziness.        Objective:   Vitals: BP (!) 120/90 (BP Location: Left Arm, Patient Position: Standing)   Pulse 96   Ht 5' 5.25 (1.657 m)   Wt 151 lb (68.5 kg)   LMP 01/01/2008   SpO2 95%   BMI 24.94 kg/m    Physical Exam Vitals and nursing note reviewed.  Constitutional:      General: She is not in acute distress.    Appearance: Normal appearance. She is not toxic-appearing.  HENT:  Head: Normocephalic and atraumatic.  Eyes:     Comments: Leftward Nystagmus   Neck:     Thyroid : No thyromegaly.     Vascular: No carotid bruit.  Cardiovascular:     Rate and Rhythm: Normal rate and regular rhythm. No extrasystoles are present.    Pulses: Normal pulses.     Heart sounds: Normal heart sounds. No murmur heard.    No friction rub. No gallop.  Pulmonary:     Effort: Pulmonary effort is normal. No respiratory distress.     Breath sounds: Normal breath sounds. No wheezing or rales.  Skin:    General: Skin is warm and dry.  Neurological:     Mental Status: She is alert and oriented to person, place, and time. Mental status is at baseline.  Psychiatric:        Mood and Affect: Mood normal.        Behavior: Behavior normal.        Thought Content: Thought content normal.        Judgment: Judgment normal.       Results:     Labs:       Component Value Date/Time   NA 135 03/15/2015 0605   K 3.4 (L) 03/15/2015 0605   CL 96 03/15/2015 0605   CO2 33 (H) 03/15/2015 0605    GLUCOSE 117 (H) 03/15/2015 0605   BUN 12 03/15/2015 0605   CREATININE 0.54 03/15/2015 0605   CREATININE 0.64 01/26/2014 1255   CALCIUM 9.0 03/15/2015 0605   PROT 5.9 (L) 03/15/2015 0605   ALBUMIN 2.9 (L) 03/15/2015 0605   AST 20 03/15/2015 0605   ALT 21 03/15/2015 0605   ALKPHOS 48 03/15/2015 0605   BILITOT 0.8 03/15/2015 0605   GFRNONAA >90 03/15/2015 0605   GFRAA >90 03/15/2015 0605     Lab Results  Component Value Date   WBC 6.7 10/29/2023   HGB 13.8 10/29/2023   HCT 41.8 10/29/2023   MCV 94.4 10/29/2023   PLT 298 10/29/2023      Lab Results  Component Value Date   TSH 0.83 04/07/2018      Assessment & Plan:    She says that recently she has been getting light headed. She says it happens when she sits down and stands up but denies it happening when she is walking. She stopped taking chlorthalidone  25 mg daily to see if it was the cause of her lightheadedness but started taking it again when her blood pressure got high. Physical exam revealed a leftward nystagmus. She says the mornings is when she feels the most light headed. She has not been working out as much due to the light headedness but she does say that she is well hydrated.  She was prescribed losartan  50 mg daily and told discontinue Chlorthalidone  25 mg daily and monitor her blood pressure several times a day.    I,Makayla C Reid,acting as a scribe for Ronal JINNY Hailstone, MD.,have documented all relevant documentation on the behalf of Ronal JINNY Hailstone, MD,as directed by  Ronal JINNY Hailstone, MD while in the presence of Ronal JINNY Hailstone, MD.

## 2024-09-15 NOTE — Patient Instructions (Addendum)
 I believe you have benign positional vertigo, a disturbance of the inner ear.   I would stop chlorthalidone  for the present time and take Losartan   50 mg daily for hypertension. We can prescribe Xanax for vertigo if needed. Xanax usually works better than other meds for dizziness such as Antivert (Meclizine ). Monitor your blood pressure at least daily and let me know if not controlled.  Call us  if you have any new questions or concerns at all. As always, it was a pleasure to see you today.

## 2024-09-21 ENCOUNTER — Ambulatory Visit: Payer: Self-pay | Admitting: Internal Medicine

## 2024-09-21 ENCOUNTER — Ambulatory Visit: Admitting: Internal Medicine

## 2024-09-21 VITALS — BP 150/90 | HR 84 | Temp 99.2°F | Ht 62.5 in | Wt 151.0 lb

## 2024-09-21 DIAGNOSIS — I10 Essential (primary) hypertension: Secondary | ICD-10-CM

## 2024-09-21 DIAGNOSIS — R399 Unspecified symptoms and signs involving the genitourinary system: Secondary | ICD-10-CM

## 2024-09-21 DIAGNOSIS — R319 Hematuria, unspecified: Secondary | ICD-10-CM

## 2024-09-21 DIAGNOSIS — R030 Elevated blood-pressure reading, without diagnosis of hypertension: Secondary | ICD-10-CM | POA: Diagnosis not present

## 2024-09-21 DIAGNOSIS — N390 Urinary tract infection, site not specified: Secondary | ICD-10-CM | POA: Diagnosis not present

## 2024-09-21 DIAGNOSIS — R300 Dysuria: Secondary | ICD-10-CM

## 2024-09-21 LAB — POCT URINALYSIS DIP (CLINITEK)
Bilirubin, UA: NEGATIVE
Glucose, UA: NEGATIVE mg/dL
Ketones, POC UA: NEGATIVE mg/dL
Leukocytes, UA: NEGATIVE
Nitrite, UA: NEGATIVE
POC PROTEIN,UA: NEGATIVE
Spec Grav, UA: 1.025 (ref 1.010–1.025)
Urobilinogen, UA: 0.2 U/dL
pH, UA: 6 (ref 5.0–8.0)

## 2024-09-21 MED ORDER — LEVOFLOXACIN 500 MG PO TABS: 500.0000 mg | ORAL_TABLET | Freq: Every day | ORAL | 0 refills | Status: AC

## 2024-09-21 NOTE — Progress Notes (Signed)
 Patient Care Team: Perri Ronal PARAS, MD as PCP - General (Internal Medicine)  Visit Date: 09/22/24  Subjective:    Patient ID: Tara Stanley , Female   DOB: September 28, 1956, 68 y.o.    MRN: [id number]   68 y.o. Female presents today for  acute visit for symptoms of a UTI . Patient has a past medical history of Hypertension, right hip pain, osteopenia.  She says this has been her third time with UTI symptoms in two months. The first time was in [place] , The second time in [place] . She has frequency in urination and says it doesn't itch but it does burn when she urinates. 09/10/2024 Urinalysis small occult blood, 0-trace leukocytes, negative nitrite. 09/21/2024 small occult blood, negative nitrite and leukocytes.    History of Hypertension treated with losartan  50 mg daily. Blood pressure today is hypertensive at 150/90. She says that she measures her blood pressure every day at home and says that it is usually normal besides today in office. She says that her dizziness has improved.   Past Medical History:  Diagnosis Date   Arthritis    osteoarthritis   History of frequent urinary tract infections    on prophalaytic medication    Hypertension    Melanoma (HCC)    right upper arm   Osteomyelitis (HCC) 1967   left leg   Osteopenia    Renal cyst    benign, found on CT scan, is being followed   Spinal stenosis    mild hx of herniated disc   Thyroid  nodule     nodule, hypothyroidism pt was on synthyroid but is not having to take currently pt states her MD states she does not have issues with      Family History  Problem Relation Age of Onset   Diabetes Mother        borderline type II   Hypertension Mother    Arthritis Mother    Hypertension Father    Stroke Father    COPD Father    Hypertension Sister    Osteoarthritis Sister    Hypertension Brother    Osteoarthritis Brother    Hypertension Other        all grandparents   Stroke Maternal Grandmother     Osteoporosis Maternal Grandmother    Stroke Paternal Grandfather    Aneurysm Maternal Aunt 63   Colon cancer Neg Hx    Esophageal cancer Neg Hx    Stomach cancer Neg Hx    Rectal cancer Neg Hx    Thyroid  disease Neg Hx          Review of Systems  Genitourinary:  Positive for dysuria and frequency.   No shaking chills, nausea or vomiting     Objective:   Vitals: BP (!) 150/90   Pulse 84   Temp 99.2 F (37.3 C)   Ht 5' 2.5 (1.588 m)   Wt 151 lb (68.5 kg)   LMP 01/01/2008   SpO2 97%   BMI 27.18 kg/m   Seen in office with dysuria and frequency. Husband is at home recovering from recent orthopedic surgery and she is anxious to get home to him. No CVA tenderness   Dipstick urine specimen has small amount of occult blood otherwise negative, Culture was sent.  Results:   Studies obtained and personally reviewed by me:     Labs:       Component Value Date/Time   NA 135 03/15/2015 0605   K 3.4 (L)  03/15/2015 0605   CL 96 03/15/2015 0605   CO2 33 (H) 03/15/2015 0605   GLUCOSE 117 (H) 03/15/2015 0605   BUN 12 03/15/2015 0605   CREATININE 0.54 03/15/2015 0605   CREATININE 0.64 01/26/2014 1255   CALCIUM 9.0 03/15/2015 0605   PROT 5.9 (L) 03/15/2015 0605   ALBUMIN 2.9 (L) 03/15/2015 0605   AST 20 03/15/2015 0605   ALT 21 03/15/2015 0605   ALKPHOS 48 03/15/2015 0605   BILITOT 0.8 03/15/2015 0605   GFRNONAA >90 03/15/2015 0605   GFRAA >90 03/15/2015 0605     Lab Results  Component Value Date   WBC 6.7 10/29/2023   HGB 13.8 10/29/2023   HCT 41.8 10/29/2023   MCV 94.4 10/29/2023   PLT 298 10/29/2023    No results found for: CHOL, HDL, LDLCALC, LDLDIRECT, TRIG, CHOLHDL  No results found for: HGBA1C   Lab Results  Component Value Date   TSH 0.83 04/07/2018         Assessment & Plan:   UTI symptoms: She says this has been her third time with UTI symptoms in two months. The first time was in [place] , The second the in [place] .  She has frequency in urination and says it doesn't itch but it does burn when she urinates. 09/10/2024 Urinalysis small occult blood, 0-trace leukocytes, negative nitrite. 09/21/2024 small occult blood, negative nitrite and leukocytes.     Levaquin  500 mg daily for seven days prescribed.   Urine culture ordered.  Hypertension: treated with losartan  50 mg daily. Blood pressure today is hypertensive at 150/90. She says that she measures her blood pressure every day at home and says that it is usually normal besides today in office. She says that her dizziness has improved. I think BP is elevated due to being uncomfortable with urinary symptoms and situational stress. Continue to monitor at home.    I,Makayla C Reid,acting as a scribe for Ronal JINNY Hailstone, MD.,have documented all relevant documentation on the behalf of Ronal JINNY Hailstone, MD,as directed by  Ronal JINNY Hailstone, MD while in the presence of Ronal JINNY Hailstone, MD.

## 2024-09-22 ENCOUNTER — Encounter: Payer: Self-pay | Admitting: Internal Medicine

## 2024-09-22 NOTE — Patient Instructions (Addendum)
 We are sorry you are not feeling well.  Urine culture has been ordered.  Please take Levaquin  500 milligrams daily for 7 days.  We will contact you with urine culture results.  Blood pressure is elevated today likely due to situational stress and discomfort with UTI.  Please monitor your blood pressure at home and let me know if not well-controlled.  We will notify you of results of urine culture and may want to recheck urine specimen in a few days.  We will be in touch by phone.

## 2024-09-23 LAB — URINE CULTURE
MICRO NUMBER:: 16998859
SPECIMEN QUALITY:: ADEQUATE

## 2024-10-01 ENCOUNTER — Encounter: Payer: Self-pay | Admitting: Internal Medicine

## 2024-10-07 ENCOUNTER — Encounter: Payer: Self-pay | Admitting: Internal Medicine

## 2024-10-07 ENCOUNTER — Ambulatory Visit: Admitting: Internal Medicine

## 2024-10-07 VITALS — BP 120/80 | HR 100 | Ht 62.5 in | Wt 151.0 lb

## 2024-10-07 DIAGNOSIS — I10 Essential (primary) hypertension: Secondary | ICD-10-CM | POA: Diagnosis not present

## 2024-10-07 DIAGNOSIS — R030 Elevated blood-pressure reading, without diagnosis of hypertension: Secondary | ICD-10-CM

## 2024-10-07 MED ORDER — LOSARTAN POTASSIUM 100 MG PO TABS: 100.0000 mg | ORAL_TABLET | Freq: Every day | ORAL | Status: AC

## 2024-10-07 NOTE — Patient Instructions (Addendum)
 She  may take Losartan  100 mg daily and see how blood pressure responds.Can add chlorthalidone  in if needed.

## 2024-10-07 NOTE — Progress Notes (Signed)
   Subjective:    Patient ID: Tara Stanley, female    DOB: 1956-03-13, 68 y.o.   MRN: [id number]  HPI Her blood pressure readings have been elevated over the past few days.Husband is recovering at home from surgery.Patient had vertigo September 15. Blood pressure then was 120/90.She had leftward nystagmus with extraocular movement testing. Cardiac exam was normal.She had discontinued chlorthalidone  which had been prescribed during her annual physical at [facility] in January 2025. Records indicated the dose was 25 mg and she was told to take a half tablet daily. At the visit on September 15, she was told to hold chlorthalidone  and take losartan  50 mg daily. Blood pressure is starting to go up but vertigo has resolved. She is worried about her blood pressure elevation.Patient is having left hip arthroplasty on November 4th.    Review of Systems no headache, nausea, vomiting or photophobia.     Objective:   Physical Exam VS reviewed. Today blood pressure is excellent at 120/80 but pulse slightly high at 100. Weight is 151 pounds.  Chest is clear. Cor:RRR without murmur or ectopy. Trace LE edema.     Assessment & Plan:   Elevated blood pressure readings off diuretic.   Plan: She will take Losartan  100 mg daily.   She may send My Chart message or call the office with concerns. Her main concern is having acceptable blood pressure at time of hip arthroplasty. We have some time to sort this out and optimize blood pressure control.We can always add diuretic back into her regimen as next step if not well controlled.

## 2024-10-19 NOTE — Progress Notes (Signed)
 Patient Care Team: Perri Ronal PARAS, MD as PCP - General (Internal Medicine)  Visit Date: 10/22/24  Subjective:    Patient ID: Tara Stanley , Female   DOB: 10/10/1956, 68 y.o.    MRN: [id number]   68 y.o. Female presents today for Pre-op clearance for a total left hip arthroplasty. Patient has a past medical history of hypertension and mild osteopenia.  She has left hip pain due to end stage arthritis and is having a total hip arthroplasty and requires pre-operative clearance for the procedure. She was last seen at [facility] on 10/08/2024.Orthopedic Surgeon is Dr. Dempsey Lawn.  History of Hypertension treated with Losartan  100 mg daily.  Blood pressure is stable at 130/80. Dependent edema treated with Chlorthalidone  12.5 mg daily.  She said her blood pressure readings have been fluctuating recently. However, husband is recuperating at home from orthopedic surgery and patient is his primary care giver.  History of Osteopenia treated with Evista  60 mg daily. Last Dexa scan  results T score -1.4  Health maintenance: Did Cologuard in June.  Declines colonoscopy  Vaccine counseling: Covid-19 vaccine due. UTD on Influenza vaccine.   Past Medical History:  Diagnosis Date   Arthritis    osteoarthritis   History of frequent urinary tract infections    on prophalaytic medication    Hypertension    Melanoma (HCC)    right upper arm   Osteomyelitis (HCC) 1967   left leg   Osteopenia    Renal cyst    benign, found on CT scan, is being followed   Spinal stenosis    mild hx of herniated disc   Thyroid  nodule     nodule, hypothyroidism pt was on synthyroid but is not having to take currently pt states her MD states she does not have issues with      Family History  Problem Relation Age of Onset   Diabetes Mother        borderline type II   Hypertension Mother    Arthritis Mother    Hypertension Father    Stroke Father    COPD Father    Hypertension Sister     Osteoarthritis Sister    Hypertension Brother    Osteoarthritis Brother    Hypertension Other        all grandparents   Stroke Maternal Grandmother    Osteoporosis Maternal Grandmother    Stroke Paternal Grandfather    Aneurysm Maternal Aunt 63   Colon cancer Neg Hx    Esophageal cancer Neg Hx    Stomach cancer Neg Hx    Rectal cancer Neg Hx    Thyroid  disease Neg Hx   Family History Narrative.  Her father died at 35 he had a long protracted illness after a tractor accident. His mother died at 65 from old age. She has a older brother who is overweight and has sleep apnea. She has a older sister who is obese. There is no family history of premature atherosclerotic heart disease.    Social History Narrative  She was born in [place] and raised in [place]. She attended [employer] and has an Education officer, museum in [place]. She is married and has 2 adult daughters and has several grandchildren. She enjoys traveling, hiking, and biking ,and gardening.      Review of Systems  Constitutional:  Negative for fever and malaise/fatigue.  HENT:  Negative for congestion.   Eyes:  Negative for blurred vision.  Respiratory:  Negative  for cough and shortness of breath.   Cardiovascular:  Negative for chest pain, palpitations and leg swelling.  Gastrointestinal:  Negative for vomiting.  Musculoskeletal:  Negative for back pain.  Skin:  Negative for rash.  Neurological:  Negative for loss of consciousness and headaches.        Objective:   Vitals: BP 130/80   Pulse 88   Ht 5' 2.5 (1.588 m)   Wt 151 lb (68.5 kg)   LMP 01/01/2008   SpO2 97%   BMI 27.18 kg/m    Physical Exam Vitals and nursing note reviewed.  Constitutional:      General: She is not in acute distress.    Appearance: Normal appearance. She is not ill-appearing or toxic-appearing.  HENT:     Head: Normocephalic and atraumatic.     Right Ear: Hearing, tympanic membrane, ear canal and external ear  normal.     Left Ear: Hearing, tympanic membrane, ear canal and external ear normal.     Mouth/Throat:     Pharynx: Oropharynx is clear.  Eyes:     Extraocular Movements: Extraocular movements intact.     Pupils: Pupils are equal, round, and reactive to light.  Neck:     Thyroid : No thyroid  mass, thyromegaly or thyroid  tenderness.     Vascular: No carotid bruit.  Cardiovascular:     Rate and Rhythm: Normal rate and regular rhythm. No extrasystoles are present.    Pulses:          Dorsalis pedis pulses are 2+ on the right side and 2+ on the left side.     Heart sounds: Normal heart sounds. No murmur heard.    No friction rub. No gallop.  Pulmonary:     Effort: Pulmonary effort is normal.     Breath sounds: Normal breath sounds. No decreased breath sounds, wheezing, rhonchi or rales.  Chest:     Chest wall: No mass.  Abdominal:     Palpations: Abdomen is soft. There is no hepatomegaly, splenomegaly or mass.     Tenderness: There is no abdominal tenderness.     Hernia: No hernia is present.  Musculoskeletal:     Cervical back: Normal range of motion.     Right lower leg: No edema.     Left lower leg: No edema.  Lymphadenopathy:     Cervical: No cervical adenopathy.     Upper Body:     Right upper body: No supraclavicular adenopathy.     Left upper body: No supraclavicular adenopathy.  Skin:    General: Skin is warm and dry.  Neurological:     General: No focal deficit present.     Mental Status: She is alert and oriented to person, place, and time. Mental status is at baseline.     Sensory: Sensation is intact.     Motor: Motor function is intact. No weakness.     Deep Tendon Reflexes: Reflexes are normal and symmetric.  Psychiatric:        Attention and Perception: Attention normal.        Mood and Affect: Mood normal.        Speech: Speech normal.        Behavior: Behavior normal.        Thought Content: Thought content normal.        Cognition and Memory: Cognition  normal.        Judgment: Judgment normal.       Results:     Labs:  Component Value Date/Time   NA 135 03/15/2015 0605   K 3.4 (L) 03/15/2015 0605   CL 96 03/15/2015 0605   CO2 33 (H) 03/15/2015 0605   GLUCOSE 117 (H) 03/15/2015 0605   BUN 12 03/15/2015 0605   CREATININE 0.54 03/15/2015 0605   CREATININE 0.64 01/26/2014 1255   CALCIUM 9.0 03/15/2015 0605   PROT 5.9 (L) 03/15/2015 0605   ALBUMIN 2.9 (L) 03/15/2015 0605   AST 20 03/15/2015 0605   ALT 21 03/15/2015 0605   ALKPHOS 48 03/15/2015 0605   BILITOT 0.8 03/15/2015 0605   GFRNONAA >90 03/15/2015 0605   GFRAA >90 03/15/2015 0605     Lab Results  Component Value Date   WBC 6.7 10/29/2023   HGB 13.8 10/29/2023   HCT 41.8 10/29/2023   MCV 94.4 10/29/2023   PLT 298 10/29/2023     Lab Results  Component Value Date   TSH 0.83 04/07/2018         Assessment & Plan:   Orders Placed This Encounter  Procedures   CBC with Differential/Platelet   Hemoglobin A1c   Basic Metabolic Panel   Protime-INR   POCT URINALYSIS DIP (CLINITEK)   EKG 12-Lead    Pre-op Exam: She has left hip pain due to end stage arthritis and is having a total hip arthroplasty and surgeon requires pre-operative clearance for the procedure. She was last seen at [facility] on 10/08/2024.    CBC w/ diff, Hemoglobin A1c, BMP, Protime-INR ordered as requested by surgeon  Hypertension: treated with Losartan  100 mg daily and chlorthalidone  12.5 mg daily.  Blood pressure is normal at 130/80.   Dependent edema treated with Chlorthalidone  12.5 mg daily.    She said her blood pressure  readings have been fluctuating recently.  Situational stress with husband recovering from orthopedic surgery may be playing a role. She said that she would consider going to the Hypertension clinic. She will let me know post-surgery if blood pressure remains elevated and if she would like referral.  Osteopenia treated with Evista  60 mg daily per GYN Dr.  Elvie Pinal  Health maintenance: Completed Cologuard in June. Mammogram up to date  Vaccine counseling: Covid-19 vaccine due. UTD on annual Influenza vaccine. She should consider RSV vaccine, She should also consider Pneumococcal 20 vaccine.  Plan: Patient is medically cleared for hip arthroplasty .Labs drawn as requested and results pending.EKG performed.   I,Makayla C Reid,acting as a scribe for Ronal JINNY Hailstone, MD.,have documented all relevant documentation on the behalf of Ronal JINNY Hailstone, MD,as directed by  Ronal JINNY Hailstone, MD while in the presence of Ronal JINNY Hailstone, MD.   I, Ronal JINNY Hailstone, MD, have reviewed all documentation for this visit. The documentation on 10/22/2024 for the exam, diagnosis, procedures, and orders are all accurate and complete.

## 2024-10-22 ENCOUNTER — Encounter: Payer: Self-pay | Admitting: Internal Medicine

## 2024-10-22 ENCOUNTER — Ambulatory Visit: Admitting: Internal Medicine

## 2024-10-22 ENCOUNTER — Other Ambulatory Visit

## 2024-10-22 VITALS — BP 130/80 | HR 88 | Ht 62.5 in | Wt 151.0 lb

## 2024-10-22 DIAGNOSIS — I10 Essential (primary) hypertension: Secondary | ICD-10-CM

## 2024-10-22 DIAGNOSIS — M8580 Other specified disorders of bone density and structure, unspecified site: Secondary | ICD-10-CM

## 2024-10-22 DIAGNOSIS — Z01818 Encounter for other preprocedural examination: Secondary | ICD-10-CM | POA: Diagnosis not present

## 2024-10-22 LAB — POCT URINALYSIS DIP (CLINITEK)
Bilirubin, UA: NEGATIVE
Blood, UA: NEGATIVE
Glucose, UA: NEGATIVE mg/dL
Ketones, POC UA: NEGATIVE mg/dL
Leukocytes, UA: NEGATIVE
Nitrite, UA: NEGATIVE
POC PROTEIN,UA: NEGATIVE
Spec Grav, UA: 1.015 (ref 1.010–1.025)
Urobilinogen, UA: 0.2 U/dL
pH, UA: 6.5 (ref 5.0–8.0)

## 2024-10-22 NOTE — Patient Instructions (Addendum)
 It was a pleasure to see you today as always. Labs drawn and results pending. Form completed for medical clearance for hip arthroplasty.

## 2024-10-23 ENCOUNTER — Ambulatory Visit: Payer: Self-pay | Admitting: Internal Medicine

## 2024-10-23 LAB — BASIC METABOLIC PANEL WITH GFR
BUN: 18 mg/dL (ref 7–25)
CO2: 26 mmol/L (ref 20–32)
Calcium: 9.7 mg/dL (ref 8.6–10.4)
Chloride: 105 mmol/L (ref 98–110)
Creat: 0.56 mg/dL (ref 0.50–1.05)
Glucose, Bld: 84 mg/dL (ref 65–139)
Potassium: 4.8 mmol/L (ref 3.5–5.3)
Sodium: 139 mmol/L (ref 135–146)
eGFR: 99 mL/min/1.73m2 (ref >=60)

## 2024-10-23 LAB — CBC WITH DIFFERENTIAL/PLATELET
Absolute Lymphocytes: 1804 {cells}/uL (ref 850–3900)
Absolute Monocytes: 435 {cells}/uL (ref 200–950)
Basophils Absolute: 50 {cells}/uL (ref 0–200)
Basophils Relative: 0.9 %
Eosinophils Absolute: 182 {cells}/uL (ref 15–500)
Eosinophils Relative: 3.3 %
HCT: 43.2 % (ref 35.0–45.0)
Hemoglobin: 14.2 g/dL (ref 11.7–15.5)
MCH: 31.3 pg (ref 27.0–33.0)
MCHC: 32.9 g/dL (ref 32.0–36.0)
MCV: 95.2 fL (ref 80.0–100.0)
MPV: 11.1 fL (ref 7.5–12.5)
Monocytes Relative: 7.9 %
Neutro Abs: 3031 {cells}/uL (ref 1500–7800)
Neutrophils Relative %: 55.1 %
Platelets: 277 Thousand/uL (ref 140–400)
RBC: 4.54 Million/uL (ref 3.80–5.10)
RDW: 11.8 % (ref 11.0–15.0)
Total Lymphocyte: 32.8 %
WBC: 5.5 Thousand/uL (ref 3.8–10.8)

## 2024-10-23 LAB — HEMOGLOBIN A1C
Hgb A1c MFr Bld: 5.4 % (ref <5.7)
Mean Plasma Glucose: 108 mg/dL
eAG (mmol/L): 6 mmol/L

## 2024-10-23 LAB — PROTIME-INR
INR: 0.9
Prothrombin Time: 9.9 s (ref 9.0–11.5)

## 2024-11-05 LAB — COLOGUARD: COLOGUARD: NEGATIVE

## 2024-12-14 NOTE — Telephone Encounter (Signed)
 done

## 2024-12-21 ENCOUNTER — Ambulatory Visit: Admitting: Internal Medicine

## 2024-12-21 ENCOUNTER — Encounter: Payer: Self-pay | Admitting: Internal Medicine

## 2024-12-21 ENCOUNTER — Ambulatory Visit: Payer: Self-pay | Admitting: Internal Medicine

## 2024-12-21 VITALS — BP 120/80 | HR 90 | Temp 98.6°F | Ht 62.5 in | Wt 151.0 lb

## 2024-12-21 DIAGNOSIS — R059 Cough, unspecified: Secondary | ICD-10-CM | POA: Diagnosis not present

## 2024-12-21 DIAGNOSIS — J101 Influenza due to other identified influenza virus with other respiratory manifestations: Secondary | ICD-10-CM

## 2024-12-21 DIAGNOSIS — R509 Fever, unspecified: Secondary | ICD-10-CM

## 2024-12-21 DIAGNOSIS — Z20828 Contact with and (suspected) exposure to other viral communicable diseases: Secondary | ICD-10-CM | POA: Diagnosis not present

## 2024-12-21 LAB — POC COVID19/FLU A&B COMBO
Covid Antigen, POC: NEGATIVE
Influenza A Antigen, POC: NEGATIVE
Influenza B Antigen, POC: NEGATIVE

## 2024-12-21 MED ORDER — OSELTAMIVIR PHOSPHATE 75 MG PO CAPS: 75.0000 mg | ORAL_CAPSULE | Freq: Two times a day (BID) | ORAL | 0 refills | Status: AC

## 2024-12-21 NOTE — Progress Notes (Signed)
 "   Patient Care Team: Perri Ronal PARAS, MD as PCP - General (Internal Medicine)  Visit Date: 12/21/2024  Subjective:    Patient ID: Tara Stanley , Female   DOB: 11/21/1956, 68 y.o.    MRN: [id number]   68 y.o. Female presents today for cough. Patient has a past medical history of Hypertension, osteopenia .  From Wednesday to Friday she was staying with her daughters family. On Saturday she had a fever of 101 degrees and then developed a cough. Several members of her family have tested positive for the influenza including her husband. She said that yesterday she took a Influenza test and it was positive. 12/21/2024 Covid-19 and Influenza test was negative.  She said that she took some of her husband's Tamiflu  and Benzonatate  for her cough.   Vaccine counseling: UTD on Influenza vaccine.   Past Medical History:  Diagnosis Date   Arthritis    osteoarthritis   History of frequent urinary tract infections    on prophalaytic medication    Hypertension    Melanoma (HCC)    right upper arm   Osteomyelitis (HCC) 1967   left leg   Osteopenia    Renal cyst    benign, found on CT scan, is being followed   Spinal stenosis    mild hx of herniated disc   Thyroid  nodule     nodule, hypothyroidism pt was on synthyroid but is not having to take currently pt states her MD states she does not have issues with      Family History  Problem Relation Age of Onset   Diabetes Mother        borderline type II   Hypertension Mother    Arthritis Mother    Hypertension Father    Stroke Father    COPD Father    Hypertension Sister    Osteoarthritis Sister    Hypertension Brother    Osteoarthritis Brother    Hypertension Other        all grandparents   Stroke Maternal Grandmother    Osteoporosis Maternal Grandmother    Stroke Paternal Grandfather    Aneurysm Maternal Aunt 63   Colon cancer Neg Hx    Esophageal cancer Neg Hx    Stomach cancer Neg Hx    Rectal cancer Neg Hx    Thyroid   disease Neg Hx     Family History Narrative.  Her father died at 47 he had a long protracted illness after a tractor accident. His mother died at 72 from old age. She has a older brother who is overweight and has sleep apnea. She has a older sister who is obese. There is no family history of premature atherosclerotic heart disease.     Social History Narrative  She was born in [place] and raised in [place]. 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 She is married and has 2 adult daughters and has several grandchildren. She enjoys traveling, hiking, and biking ,and gardening.      Review of Systems  Constitutional:  Positive for fever.  Respiratory:  Positive for cough.         Objective:   Vitals: BP 120/80   Pulse 90   Temp 98.6 F (37 C)   Ht 5' 2.5 (1.588 m)   Wt 151 lb (68.5 kg)   LMP 01/01/2008   SpO2 97%   BMI 27.18 kg/m    Physical Exam Vitals and nursing note reviewed.  Constitutional:      General: She is not in acute distress.    Appearance: Normal appearance. She is not ill-appearing.  HENT:     Head: Normocephalic and atraumatic.     Right Ear: Tympanic membrane, ear canal and external ear normal.     Left Ear: Tympanic membrane, ear canal and external ear normal.     Mouth/Throat:     Mouth: Mucous membranes are moist.     Pharynx: Oropharynx is clear. Posterior oropharyngeal erythema present. No oropharyngeal exudate.  Pulmonary:     Effort: Pulmonary effort is normal.     Breath sounds: Normal breath sounds. No wheezing, rhonchi or rales.  Lymphadenopathy:     Cervical: No cervical adenopathy.  Skin:    General: Skin is warm and dry.  Neurological:     Mental Status: She is alert and oriented to person, place, and time. Mental status is at baseline.  Psychiatric:        Mood and Affect: Mood normal.        Behavior: Behavior normal.        Thought Content: Thought content normal.        Judgment:  Judgment normal.       Results:    Labs:       Component Value Date/Time   NA 139 10/22/2024 1015   K 4.8 10/22/2024 1015   CL 105 10/22/2024 1015   CO2 26 10/22/2024 1015   GLUCOSE 84 10/22/2024 1015   BUN 18 10/22/2024 1015   CREATININE 0.56 10/22/2024 1015   CALCIUM 9.7 10/22/2024 1015   PROT 5.9 (L) 03/15/2015 0605   ALBUMIN 2.9 (L) 03/15/2015 0605   AST 20 03/15/2015 0605   ALT 21 03/15/2015 0605   ALKPHOS 48 03/15/2015 0605   BILITOT 0.8 03/15/2015 0605   GFRNONAA >90 03/15/2015 0605   GFRAA >90 03/15/2015 0605     Lab Results  Component Value Date   WBC 5.5 10/22/2024   HGB 14.2 10/22/2024   HCT 43.2 10/22/2024   MCV 95.2 10/22/2024   PLT 277 10/22/2024    Lab Results  Component Value Date   HGBA1C 5.4 10/22/2024     Lab Results  Component Value Date   TSH 0.83 04/07/2018        Assessment & Plan:   Orders Placed This Encounter  Procedures   POC Covid19/Flu A&B Antigen   Meds ordered this encounter  Medications   oseltamivir  (TAMIFLU ) 75 MG capsule    Sig: Take 1 capsule (75 mg total) by mouth 2 (two) times daily.    Dispense:  10 capsule    Refill:  0     Exposure to the Flu: From Wednesday to Friday she was staying with her daughters family. On Saturday she had a fever of 101 degrees and then developed a cough. Several members of her family have tested positive for the influenza including her husband. She said that yesterday she took a Influenza test and it was positive. 12/21/2024 Covid-19 and Influenza test was negative.  She said that she took some of her husband's Tamiflu  and Benzonatate  for her cough.   Tamiflu  75 mg twice daily prescribed.   Vaccine counseling: UTD on Influenza vaccine.     I,Makayla C Reid,acting as a scribe for Ronal JINNY Hailstone, MD.,have documented all relevant documentation on the behalf of Ronal JINNY Hailstone, MD,as directed by  Ronal JINNY Hailstone, MD while in the presence of Ronal JINNY Hailstone, MD.      "

## 2024-12-21 NOTE — Patient Instructions (Addendum)
 Patient has flu symptoms and had known exposure to Influenza. Sending in Tamiflu  75 mg twice daily for 5 days.   Quarantine at home 48-72 hours or until afebrile.

## 2024-12-26 ENCOUNTER — Encounter: Payer: Self-pay | Admitting: Internal Medicine

## 2025-10-11 ENCOUNTER — Ambulatory Visit (HOSPITAL_BASED_OUTPATIENT_CLINIC_OR_DEPARTMENT_OTHER): Admitting: Obstetrics & Gynecology
